# Patient Record
Sex: Female | Born: 1960 | Race: Black or African American | Hispanic: No | Marital: Single | Smoking: Never smoker
Health system: Southern US, Community
[De-identification: ages and names within clinical notes are randomized; demographics above are authoritative.]

## PROBLEM LIST (undated history)

## (undated) DIAGNOSIS — F0390 Unspecified dementia without behavioral disturbance: Secondary | ICD-10-CM

## (undated) DIAGNOSIS — D649 Anemia, unspecified: Secondary | ICD-10-CM

## (undated) DIAGNOSIS — E119 Type 2 diabetes mellitus without complications: Secondary | ICD-10-CM

## (undated) HISTORY — DX: Anemia, unspecified: D64.9

## (undated) HISTORY — PX: ABDOMINAL HYSTERECTOMY: SHX81

## (undated) HISTORY — PX: OTHER SURGICAL HISTORY: SHX169

## (undated) HISTORY — PX: CHOLECYSTECTOMY: SHX55

---

## 2018-04-06 ENCOUNTER — Emergency Department
Admission: EM | Admit: 2018-04-06 | Discharge: 2018-04-06 | Disposition: A | Discharge status: Home / Self Care | Payer: Self-pay | Attending: Emergency Medicine | Admitting: Emergency Medicine

## 2018-04-06 ENCOUNTER — Emergency Department: Payer: Self-pay

## 2018-04-06 ENCOUNTER — Other Ambulatory Visit: Payer: Self-pay

## 2018-04-06 ENCOUNTER — Encounter: Payer: Self-pay | Admitting: Emergency Medicine

## 2018-04-06 DIAGNOSIS — Y9389 Activity, other specified: Secondary | ICD-10-CM | POA: Insufficient documentation

## 2018-04-06 DIAGNOSIS — E119 Type 2 diabetes mellitus without complications: Secondary | ICD-10-CM | POA: Insufficient documentation

## 2018-04-06 DIAGNOSIS — Z794 Long term (current) use of insulin: Secondary | ICD-10-CM | POA: Insufficient documentation

## 2018-04-06 DIAGNOSIS — Y929 Unspecified place or not applicable: Secondary | ICD-10-CM | POA: Insufficient documentation

## 2018-04-06 DIAGNOSIS — W06XXXD Fall from bed, subsequent encounter: Secondary | ICD-10-CM | POA: Insufficient documentation

## 2018-04-06 DIAGNOSIS — S7002XA Contusion of left hip, initial encounter: Secondary | ICD-10-CM | POA: Insufficient documentation

## 2018-04-06 DIAGNOSIS — Y998 Other external cause status: Secondary | ICD-10-CM | POA: Insufficient documentation

## 2018-04-06 HISTORY — DX: Type 2 diabetes mellitus without complications: E11.9

## 2018-04-06 MED ORDER — MELOXICAM 7.5 MG PO TABS: 7.5000 mg | ORAL_TABLET | Freq: Every day | ORAL | 0 refills | Status: DC

## 2018-04-06 NOTE — Discharge Instructions (Signed)
Follow-up with your primary care doctor in [place].  Call make an appointment to be seen in 1 to 2 weeks.  Begin taking meloxicam 7.5 mg once a day with food every day until being seen by your doctor.

## 2018-04-06 NOTE — ED Triage Notes (Addendum)
Pt to triage via w/c with no distress noted; pt reports left hip pain after falling getting OOB, pt able to ambulate; denies any other c/o or injuries

## 2018-04-06 NOTE — ED Provider Notes (Signed)
[facility] Emergency Department Provider Note  ____________________________________________   First MD Initiated Contact with Patient 04/06/18 [phone]     (approximate)  I have reviewed the triage vital signs and the nursing notes.   HISTORY  Chief Complaint Hip Pain   HPI Bethany James is a 57 y.o. female their complaint of left hip pain.  Patient states that she fell out of bed approximately 1 month ago.  [facility] her [facility] and states that is not improved since that time.  She occasionally takes ibuprofen infrequently without any relief of her pain.  She continues to ambulate without assistance.  She denies any previous injury to her hip.  She denies any left leg edema since her injury.  Currently she rates her pain as 10/10.  Past Medical History:  Diagnosis Date  . Diabetes mellitus without complication (HCC)     There are no active problems to display for this patient.   Past Surgical History:  Procedure Laterality Date  . ABDOMINAL HYSTERECTOMY    . CHOLECYSTECTOMY      Prior to Admission medications   Medication Sig Start Date End Date Taking? Authorizing Provider  insulin aspart (NOVOLOG) 100 UNIT/ML injection Inject into the skin 3 (three) times daily before meals.   Yes [provider]  meloxicam (MOBIC) 7.5 MG tablet Take 1 tablet (7.5 mg total) by mouth daily. 04/06/18 04/06/19  Tommi RumpsSummers, Arelly Whittenberg L, PA-C    Allergies Patient has no known allergies.  No family history on file.  Social History Social History   Tobacco Use  . Smoking status: Never Smoker  . Smokeless tobacco: Never Used  Substance Use Topics  . Alcohol use: Not on file  . Drug use: Not on file    Review of Systems Constitutional: No fever/chills Cardiovascular: Denies chest pain. Respiratory: Denies shortness of breath. Gastrointestinal: No abdominal pain.  No nausea, no vomiting.  Musculoskeletal: Positive for left hip pain. Skin:  Negative for rash. Neurological: Negative for focal weakness or numbness. ___________________________________________   PHYSICAL EXAM:  VITAL SIGNS: ED Triage Vitals  Enc Vitals Group     BP 04/06/18 0638 114/84     Pulse Rate 04/06/18 0638 95     Resp 04/06/18 0638 18     Temp 04/06/18 0638 98.3 F (36.8 C)     Temp Source 04/06/18 0638 Oral     SpO2 04/06/18 0638 98 %     Weight 04/06/18 0636 157 lb (71.2 kg)     Height 04/06/18 0636 4\' 9"  (1.448 m)     Head Circumference --      Peak Flow --      Pain Score 04/06/18 0636 10     Pain Loc --      Pain Edu? --      Excl. in GC? --    Constitutional: Alert and oriented. Well appearing and in no acute distress. Eyes: Conjunctivae are normal.  Head: Atraumatic. Neck: No stridor.   Cardiovascular: Normal rate, regular rhythm. Grossly normal heart sounds.  Good peripheral circulation. Respiratory: Normal respiratory effort.  No retractions. Lungs CTAB. Gastrointestinal: Soft and nontender. No distention.  Musculoskeletal: On examination of left hip there is no gross deformity and range of motion is minimally restricted.  There is some tenderness on palpation posteriorly.  No soft tissue edema is appreciated.  Patient is able to abduct and adduct without difficulty.  Patient is ambulatory without assistance.  There is no edema noted  to the lower extremity.  Nontender to palpation left knee.  No effusion is appreciated.  Skin is intact. Neurologic:  Normal speech and language. No gross focal neurologic deficits are appreciated. No gait instability. Skin:  Skin is warm, dry and intact.  Psychiatric: Mood and affect are normal. Speech and behavior are normal.  ____________________________________________   LABS (all labs ordered are listed, but only abnormal results are displayed)  Labs Reviewed - No data to display  RADIOLOGY  ED MD interpretation:  Left hip x-ray is negative for fracture.  Official radiology report(s): Dg  Hip Unilat W Or Wo Pelvis 2-3 Views Left  Result Date: 04/06/2018 CLINICAL DATA:  Pt to triage via w/c with no distress noted; pt reports left hip pain after falling getting OOB a couple days ago and going to [facility] with no findings, pt able to ambulate; denies any other c/o or injuries EXAM: DG HIP (WITH OR WITHOUT PELVIS) 2-3V LEFT COMPARISON:  None. FINDINGS: There is no evidence of hip fracture or dislocation. There is no evidence of arthropathy or other focal bone abnormality. IMPRESSION: Negative. Electronically Signed   By: Norva Pavlov M.D.   On: 04/06/2018 07:15    ____________________________________________   PROCEDURES  Procedure(s) performed: None  Procedures  Critical Care performed: No  ____________________________________________   INITIAL IMPRESSION / ASSESSMENT AND PLAN / ED COURSE  As part of my medical decision making, I reviewed the following data within the electronic MEDICAL RECORD NUMBER Notes from prior ED visits and [place] Controlled Substance Database  Patient was made aware that x-rays are negative.  Apparently she was seen by her PCP at [facility]  in [place].  Patient states that she was examined but no x-rays were done.  She is reassured on today's x-rays that there is no hip fracture.  Patient was started on meloxicam 7.5 mg 1 daily with food.  She is encouraged to make an appointment with her PCP to follow-up in 1 to 2 weeks.  ____________________________________________   FINAL CLINICAL IMPRESSION(S) / ED DIAGNOSES  Final diagnoses:  Contusion of left hip, initial encounter     ED Discharge Orders        Ordered    meloxicam (MOBIC) 7.5 MG tablet  Daily     04/06/18 0732       Note:  This document was prepared using Dragon voice recognition software and may include unintentional dictation errors.    Tommi Rumps, PA-C 04/06/18 0957    Emily Filbert, MD 04/06/18 [phone]

## 2018-04-06 NOTE — ED Notes (Signed)
See triage note  States she fell about 1 month ago  Landed on left hip  States pain is getting worse and moving into leg  Denies new injury

## 2018-08-13 ENCOUNTER — Other Ambulatory Visit: Payer: Self-pay

## 2018-08-13 ENCOUNTER — Emergency Department
Admission: EM | Admit: 2018-08-13 | Discharge: 2018-08-13 | Disposition: A | Discharge status: Home / Self Care | Payer: Medicaid Other | Attending: Emergency Medicine | Admitting: Emergency Medicine

## 2018-08-13 DIAGNOSIS — H1033 Unspecified acute conjunctivitis, bilateral: Secondary | ICD-10-CM | POA: Diagnosis present

## 2018-08-13 DIAGNOSIS — Z794 Long term (current) use of insulin: Secondary | ICD-10-CM | POA: Diagnosis not present

## 2018-08-13 DIAGNOSIS — Z79899 Other long term (current) drug therapy: Secondary | ICD-10-CM | POA: Diagnosis not present

## 2018-08-13 DIAGNOSIS — H1032 Unspecified acute conjunctivitis, left eye: Secondary | ICD-10-CM

## 2018-08-13 DIAGNOSIS — E119 Type 2 diabetes mellitus without complications: Secondary | ICD-10-CM | POA: Insufficient documentation

## 2018-08-13 MED ORDER — ERYTHROMYCIN 5 MG/GM OP OINT
TOPICAL_OINTMENT | Freq: Once | OPHTHALMIC | Status: AC
  Administered 2018-08-13: 1 via OPHTHALMIC
  Filled 2018-08-13: qty 1

## 2018-08-13 MED ORDER — POLYMYXIN B-TRIMETHOPRIM 10000-0.1 UNIT/ML-% OP SOLN: 1.0000 [drp] | OPHTHALMIC | 0 refills | Status: AC

## 2018-08-13 MED ORDER — ERYTHROMYCIN 5 MG/GM OP OINT: 1.0000 "application " | TOPICAL_OINTMENT | Freq: Two times a day (BID) | OPHTHALMIC | 0 refills | Status: DC

## 2018-08-13 NOTE — ED Triage Notes (Signed)
Reports eye swelling and redness for months.

## 2018-08-13 NOTE — ED Provider Notes (Signed)
[facility] Emergency Department Provider Note  ____________________________________________  Time seen: Approximately 11:00 PM  I have reviewed the triage vital signs and the nursing notes.   HISTORY  Chief Complaint Conjunctivitis    HPI Bethany James is a 57 y.o. female presents to the emergency department with left eye conjunctivitis for approximately 1 week.  Patient has had increased tearing and matting and crusting in the eyelashes and eyelids.  Patient's granddaughter has similar symptoms.  No photophobia, pain with extraocular eye muscle movement or vision loss.    Past Medical History:  Diagnosis Date  . Diabetes mellitus without complication (HCC)     There are no active problems to display for this patient.   Past Surgical History:  Procedure Laterality Date  . ABDOMINAL HYSTERECTOMY    . CHOLECYSTECTOMY      Prior to Admission medications   Medication Sig Start Date End Date Taking? Authorizing Provider  insulin aspart (NOVOLOG) 100 UNIT/ML injection Inject into the skin 3 (three) times daily before meals.    [provider]  meloxicam (MOBIC) 7.5 MG tablet Take 1 tablet (7.5 mg total) by mouth daily. 04/06/18 04/06/19  Tommi Rumps, PA-C  trimethoprim-polymyxin b (POLYTRIM) ophthalmic solution Place 1 drop into the left eye every 4 (four) hours for 7 days. 08/13/18 08/20/18  Orvil Feil, PA-C    Allergies Patient has no known allergies.  No family history on file.  Social History Social History   Tobacco Use  . Smoking status: Never Smoker  . Smokeless tobacco: Never Used  Substance Use Topics  . Alcohol use: Not on file  . Drug use: Not on file     Review of Systems  Constitutional: No fever/chills Eyes: No visual changes. Patient has left eye conjunctivitis.  ENT: No upper respiratory complaints. Cardiovascular: no chest pain. Respiratory: no cough. No SOB. Gastrointestinal: No abdominal pain.  No  nausea, no vomiting.  No diarrhea.  No constipation. Musculoskeletal: Negative for musculoskeletal pain. Skin: Negative for rash, abrasions, lacerations, ecchymosis. Neurological: Negative for headaches, focal weakness or numbness.   ____________________________________________   PHYSICAL EXAM:  VITAL SIGNS: ED Triage Vitals [08/13/18 2228]  Enc Vitals Group     BP 132/81     Pulse Rate 67     Resp 18     Temp 97.6 F (36.4 C)     Temp Source Oral     SpO2 99 %     Weight 130 lb (59 kg)     Height 4\' 8"  (1.422 m)     Head Circumference      Peak Flow      Pain Score 0     Pain Loc      Pain Edu?      Excl. in GC?      Constitutional: Alert and oriented. Well appearing and in no acute distress. Eyes: Conjunctivae are normal. PERRL. EOMI. Head: Atraumatic. ENT:      Ears: Patient has left eye conjunctivitis.       Nose: No congestion/rhinnorhea.      Mouth/Throat: Mucous membranes are moist.  Neck: No stridor.  No cervical spine tenderness to palpation. Cardiovascular: Normal rate, regular rhythm. Normal S1 and S2.  Good peripheral circulation. Respiratory: Normal respiratory effort without tachypnea or retractions. Lungs CTAB. Good air entry to the bases with no decreased or absent breath sounds. Skin:  Skin is warm, dry and intact. No rash noted.   ____________________________________________   LABS (all labs ordered  are listed, but only abnormal results are displayed)  Labs Reviewed - No data to display ____________________________________________  EKG   ____________________________________________  RADIOLOGY   No results found.  ____________________________________________    PROCEDURES  Procedure(s) performed:    Procedures    Medications  erythromycin ophthalmic ointment (has no administration in time range)     ____________________________________________   INITIAL IMPRESSION / ASSESSMENT AND PLAN / ED COURSE  Pertinent labs &  imaging results that were available during my care of the patient were reviewed by me and considered in my medical decision making (see chart for details).  Review of the [place] CSRS was performed in accordance of the [facility] prior to dispensing any controlled drugs.    Assessment and plan Left eye conjunctivitis Patient presents to the emergency department with increased tearing and matting and crusting of the left eye for the past week.  Patient has a known contacts with bacterial conjunctivitis in the home.  Patient was treated with Polytrim. All patient questions were answered.     ____________________________________________  FINAL CLINICAL IMPRESSION(S) / ED DIAGNOSES  Final diagnoses:  Acute bacterial conjunctivitis of left eye      NEW MEDICATIONS STARTED DURING THIS VISIT:  ED Discharge Orders         Ordered    erythromycin ophthalmic ointment  2 times daily,   Status:  Discontinued     08/13/18 2248    trimethoprim-polymyxin b (POLYTRIM) ophthalmic solution  Every 4 hours     08/13/18 2250              This chart was dictated using voice recognition software/Dragon. Despite best efforts to proofread, errors can occur which can change the meaning. Any change was purely unintentional.    Orvil Feil, PA-C 08/13/18 2306    Jeanmarie Plant, MD 08/13/18 [phone]

## 2018-11-22 ENCOUNTER — Encounter: Payer: Self-pay | Admitting: Nurse Practitioner

## 2018-11-22 LAB — HM DIABETES EYE EXAM

## 2019-02-20 ENCOUNTER — Emergency Department: Payer: Medicaid Other

## 2019-02-20 ENCOUNTER — Inpatient Hospital Stay
Admission: EM | Admit: 2019-02-20 | Discharge: 2019-02-28 | DRG: 637 | Disposition: A | Discharge status: Home / Self Care | Payer: Medicaid Other | Attending: Internal Medicine | Admitting: Internal Medicine

## 2019-02-20 ENCOUNTER — Other Ambulatory Visit: Payer: Self-pay

## 2019-02-20 ENCOUNTER — Ambulatory Visit: Payer: Self-pay

## 2019-02-20 DIAGNOSIS — D62 Acute posthemorrhagic anemia: Secondary | ICD-10-CM | POA: Diagnosis not present

## 2019-02-20 DIAGNOSIS — E876 Hypokalemia: Secondary | ICD-10-CM | POA: Diagnosis not present

## 2019-02-20 DIAGNOSIS — I469 Cardiac arrest, cause unspecified: Secondary | ICD-10-CM | POA: Diagnosis not present

## 2019-02-20 DIAGNOSIS — E872 Acidosis: Secondary | ICD-10-CM | POA: Diagnosis not present

## 2019-02-20 DIAGNOSIS — K254 Chronic or unspecified gastric ulcer with hemorrhage: Secondary | ICD-10-CM | POA: Diagnosis present

## 2019-02-20 DIAGNOSIS — E0810 Diabetes mellitus due to underlying condition with ketoacidosis without coma: Secondary | ICD-10-CM | POA: Diagnosis not present

## 2019-02-20 DIAGNOSIS — K269 Duodenal ulcer, unspecified as acute or chronic, without hemorrhage or perforation: Secondary | ICD-10-CM | POA: Diagnosis present

## 2019-02-20 DIAGNOSIS — T17918A Gastric contents in respiratory tract, part unspecified causing other injury, initial encounter: Secondary | ICD-10-CM | POA: Diagnosis not present

## 2019-02-20 DIAGNOSIS — Z90710 Acquired absence of both cervix and uterus: Secondary | ICD-10-CM | POA: Diagnosis not present

## 2019-02-20 DIAGNOSIS — N179 Acute kidney failure, unspecified: Secondary | ICD-10-CM | POA: Diagnosis present

## 2019-02-20 DIAGNOSIS — E1110 Type 2 diabetes mellitus with ketoacidosis without coma: Secondary | ICD-10-CM | POA: Diagnosis present

## 2019-02-20 DIAGNOSIS — K449 Diaphragmatic hernia without obstruction or gangrene: Secondary | ICD-10-CM | POA: Diagnosis present

## 2019-02-20 DIAGNOSIS — J690 Pneumonitis due to inhalation of food and vomit: Secondary | ICD-10-CM | POA: Diagnosis not present

## 2019-02-20 DIAGNOSIS — Z978 Presence of other specified devices: Secondary | ICD-10-CM

## 2019-02-20 DIAGNOSIS — R578 Other shock: Secondary | ICD-10-CM | POA: Diagnosis present

## 2019-02-20 DIAGNOSIS — E870 Hyperosmolality and hypernatremia: Secondary | ICD-10-CM | POA: Diagnosis present

## 2019-02-20 DIAGNOSIS — Z4659 Encounter for fitting and adjustment of other gastrointestinal appliance and device: Secondary | ICD-10-CM

## 2019-02-20 DIAGNOSIS — G9340 Encephalopathy, unspecified: Secondary | ICD-10-CM | POA: Diagnosis not present

## 2019-02-20 DIAGNOSIS — E875 Hyperkalemia: Secondary | ICD-10-CM | POA: Diagnosis present

## 2019-02-20 DIAGNOSIS — J9601 Acute respiratory failure with hypoxia: Secondary | ICD-10-CM

## 2019-02-20 DIAGNOSIS — J9602 Acute respiratory failure with hypercapnia: Secondary | ICD-10-CM | POA: Diagnosis not present

## 2019-02-20 DIAGNOSIS — E1165 Type 2 diabetes mellitus with hyperglycemia: Secondary | ICD-10-CM | POA: Diagnosis present

## 2019-02-20 DIAGNOSIS — Z794 Long term (current) use of insulin: Secondary | ICD-10-CM | POA: Diagnosis not present

## 2019-02-20 DIAGNOSIS — R571 Hypovolemic shock: Secondary | ICD-10-CM | POA: Diagnosis not present

## 2019-02-20 DIAGNOSIS — Z9049 Acquired absence of other specified parts of digestive tract: Secondary | ICD-10-CM

## 2019-02-20 DIAGNOSIS — J9600 Acute respiratory failure, unspecified whether with hypoxia or hypercapnia: Secondary | ICD-10-CM

## 2019-02-20 DIAGNOSIS — K920 Hematemesis: Secondary | ICD-10-CM | POA: Diagnosis present

## 2019-02-20 DIAGNOSIS — D72829 Elevated white blood cell count, unspecified: Secondary | ICD-10-CM

## 2019-02-20 LAB — COMPREHENSIVE METABOLIC PANEL
ALT: 11 U/L (ref 0–44)
AST: 18 U/L (ref 15–41)
Albumin: 4.5 g/dL (ref 3.5–5.0)
Alkaline Phosphatase: 113 U/L (ref 38–126)
Anion gap: 28 — ABNORMAL HIGH (ref 5–15)
BUN: 34 mg/dL — ABNORMAL HIGH (ref 6–20)
CO2: 9 mmol/L — ABNORMAL LOW (ref 22–32)
Calcium: 9.2 mg/dL (ref 8.9–10.3)
Chloride: 98 mmol/L (ref 98–111)
Creatinine, Ser: 2.76 mg/dL — ABNORMAL HIGH (ref 0.44–1.00)
GFR calc Af Amer: 21 mL/min — ABNORMAL LOW (ref >60)
GFR calc non Af Amer: 18 mL/min — ABNORMAL LOW (ref >60)
Glucose, Bld: 866 mg/dL (ref 70–99)
Potassium: 6.5 mmol/L (ref 3.5–5.1)
Sodium: 135 mmol/L (ref 135–145)
Total Bilirubin: 1.1 mg/dL (ref 0.3–1.2)
Total Protein: 7.9 g/dL (ref 6.5–8.1)

## 2019-02-20 LAB — BASIC METABOLIC PANEL
BUN: 34 mg/dL — ABNORMAL HIGH (ref 6–20)
CO2: <7 mmol/L — ABNORMAL LOW (ref 22–32)
Calcium: 8.1 mg/dL — ABNORMAL LOW (ref 8.9–10.3)
Chloride: 111 mmol/L (ref 98–111)
Creatinine, Ser: 2.15 mg/dL — ABNORMAL HIGH (ref 0.44–1.00)
GFR calc Af Amer: 29 mL/min — ABNORMAL LOW (ref >60)
GFR calc non Af Amer: 25 mL/min — ABNORMAL LOW (ref >60)
Glucose, Bld: 705 mg/dL (ref 70–99)
Potassium: 6.4 mmol/L (ref 3.5–5.1)
Sodium: 141 mmol/L (ref 135–145)

## 2019-02-20 LAB — BLOOD GAS, VENOUS
Acid-base deficit: 24.7 mmol/L — ABNORMAL HIGH (ref 0.0–2.0)
Bicarbonate: 7.6 mmol/L — ABNORMAL LOW (ref 20.0–28.0)
O2 Saturation: 26.8 %
Patient temperature: 37
pCO2, Ven: 38 mmHg — ABNORMAL LOW (ref 44.0–60.0)
pH, Ven: 6.91 — CL (ref 7.250–7.430)
pO2, Ven: 33 mmHg (ref 32.0–45.0)

## 2019-02-20 LAB — CBC
HCT: 44.5 % (ref 36.0–46.0)
HCT: 36.8 % (ref 36.0–46.0)
Hemoglobin: 14 g/dL (ref 12.0–15.0)
Hemoglobin: 11.2 g/dL — ABNORMAL LOW (ref 12.0–15.0)
MCH: 29.7 pg (ref 26.0–34.0)
MCH: 29.6 pg (ref 26.0–34.0)
MCHC: 31.5 g/dL (ref 30.0–36.0)
MCHC: 30.4 g/dL (ref 30.0–36.0)
MCV: 94.5 fL (ref 80.0–100.0)
MCV: 97.4 fL (ref 80.0–100.0)
Platelets: 445 10*3/uL — ABNORMAL HIGH (ref 150–400)
Platelets: 377 10*3/uL (ref 150–400)
RBC: 4.71 MIL/uL (ref 3.87–5.11)
RBC: 3.78 MIL/uL — ABNORMAL LOW (ref 3.87–5.11)
RDW: 13.2 % (ref 11.5–15.5)
RDW: 13.2 % (ref 11.5–15.5)
WBC: 27.7 10*3/uL — ABNORMAL HIGH (ref 4.0–10.5)
WBC: 27.3 10*3/uL — ABNORMAL HIGH (ref 4.0–10.5)
nRBC: 0 % (ref 0.0–0.2)
nRBC: 0 % (ref 0.0–0.2)

## 2019-02-20 LAB — GLUCOSE, CAPILLARY
Glucose-Capillary: >600 mg/dL (ref 70–99)
Glucose-Capillary: >600 mg/dL (ref 70–99)
Glucose-Capillary: >600 mg/dL (ref 70–99)
Glucose-Capillary: >600 mg/dL (ref 70–99)

## 2019-02-20 LAB — MRSA PCR SCREENING: MRSA by PCR: NEGATIVE

## 2019-02-20 MED ORDER — INSULIN REGULAR(HUMAN) IN NACL 100-0.9 UT/100ML-% IV SOLN
INTRAVENOUS | Status: DC
  Administered 2019-02-20: 5.4 [IU]/h via INTRAVENOUS
  Filled 2019-02-20: qty 100

## 2019-02-20 MED ORDER — ONDANSETRON HCL 4 MG/2ML IJ SOLN
4.0000 mg | Freq: Once | INTRAMUSCULAR | Status: AC
  Administered 2019-02-20: 4 mg via INTRAVENOUS
  Filled 2019-02-20: qty 2

## 2019-02-20 MED ORDER — HEPARIN SODIUM (PORCINE) 5000 UNIT/ML IJ SOLN
5000.0000 [IU] | Freq: Three times a day (TID) | INTRAMUSCULAR | Status: DC
  Administered 2019-02-20: 5000 [IU] via SUBCUTANEOUS
  Filled 2019-02-20: qty 1

## 2019-02-20 MED ORDER — PANTOPRAZOLE SODIUM 40 MG IV SOLR
40.0000 mg | Freq: Two times a day (BID) | INTRAVENOUS | Status: DC
  Administered 2019-02-20: 23:00:00 40 mg via INTRAVENOUS
  Filled 2019-02-20: qty 40

## 2019-02-20 MED ORDER — SODIUM CHLORIDE 0.9 % IV SOLN
INTRAVENOUS | Status: DC
  Administered 2019-02-20: 22:00:00 via INTRAVENOUS

## 2019-02-20 MED ORDER — SODIUM CHLORIDE 0.9 % IV BOLUS
1000.0000 mL | Freq: Once | INTRAVENOUS | Status: AC
  Administered 2019-02-20: 1000 mL via INTRAVENOUS

## 2019-02-20 MED ORDER — DEXTROSE-NACL 5-0.45 % IV SOLN
INTRAVENOUS | Status: DC
  Administered 2019-02-21: 08:00:00 via INTRAVENOUS

## 2019-02-20 MED ORDER — SODIUM CHLORIDE 0.9 % IV BOLUS
500.0000 mL | Freq: Once | INTRAVENOUS | Status: AC
  Administered 2019-02-20: 500 mL via INTRAVENOUS

## 2019-02-20 MED ORDER — SODIUM CHLORIDE 0.9 % IV BOLUS
1000.0000 mL | Freq: Once | INTRAVENOUS | Status: AC
Start: 2019-02-20 — End: 2019-02-20
  Administered 2019-02-20: 1000 mL via INTRAVENOUS

## 2019-02-20 MED ORDER — INSULIN REGULAR(HUMAN) IN NACL 100-0.9 UT/100ML-% IV SOLN
INTRAVENOUS | Status: DC
  Administered 2019-02-21: 18.1 [IU]/h via INTRAVENOUS
  Administered 2019-02-21: 13.8 [IU]/h via INTRAVENOUS
  Filled 2019-02-20: qty 100

## 2019-02-20 MED ORDER — SODIUM CHLORIDE 0.9 % IV SOLN
INTRAVENOUS | Status: DC | PRN
  Administered 2019-02-21 – 2019-02-22 (×2): 250 mL via INTRAVENOUS

## 2019-02-20 MED ORDER — SODIUM CHLORIDE 0.9 % IV SOLN
INTRAVENOUS | Status: DC
  Administered 2019-02-20: 21:00:00 via INTRAVENOUS

## 2019-02-20 NOTE — ED Triage Notes (Signed)
Pt arrived via [facility] from home after throwing up blood all day. Pt CBG read high with ems, pt reports not taking insulin today. Pt has hx of CKD, no dialysis. Pt moaning on arrival.

## 2019-02-20 NOTE — ED Provider Notes (Signed)
[facility] Emergency Department Provider Note  Time seen: 7:20 PM  I have reviewed the triage vital signs and the nursing notes.   HISTORY  Chief Complaint Hematemesis    HPI Bethany James is a 58 y.o. female with a past medical history of diabetes,  presents to the emergency department for elevated blood glucose as well as vomiting now with blood in her vomit.  According to the patient she took insulin this morning but has not taken any since.  States her blood sugars been over 600 the entire day.  Patient states she has been vomiting and has now noticed blood in her vomit as well.  Denies any abdominal pain.  States mild nausea, denies any history of DKA.  Patient denies any fever cough or congestion.  No recent travel or known sick contacts. Denies any abdominal pain or diarrhea.  Past Medical History:  Diagnosis Date  . Diabetes mellitus without complication (HCC)     There are no active problems to display for this patient.   Past Surgical History:  Procedure Laterality Date  . ABDOMINAL HYSTERECTOMY    . CHOLECYSTECTOMY      Prior to Admission medications   Medication Sig Start Date End Date Taking? Authorizing Provider  insulin aspart (NOVOLOG) 100 UNIT/ML injection Inject into the skin 3 (three) times daily before meals.    [provider]  meloxicam (MOBIC) 7.5 MG tablet Take 1 tablet (7.5 mg total) by mouth daily. 04/06/18 04/06/19  Bridget Hartshorn L, PA-C    No Known Allergies  History reviewed. No pertinent family history.  Social History Social History   Tobacco Use  . Smoking status: Never Smoker  . Smokeless tobacco: Never Used  Substance Use Topics  . Alcohol use: Not Currently  . Drug use: Never    Review of Systems Constitutional: Negative for fever Cardiovascular: Negative for chest pain. Respiratory: Negative for shortness of breath. Gastrointestinal: Negative for abdominal pain.  Positive for nausea vomiting no  blood in the vomit.  Negative for diarrhea Musculoskeletal: Negative for musculoskeletal complaints Skin: Negative for skin complaints  Neurological: Negative for headache All other ROS negative  ____________________________________________   PHYSICAL EXAM:  VITAL SIGNS: ED Triage Vitals  Enc Vitals Group     BP 02/20/19 1913 100/76     Pulse Rate 02/20/19 1913 (!) 103     Resp 02/20/19 1913 (!) 26     Temp 02/20/19 1913 98 F (36.7 C)     Temp Source 02/20/19 1913 Axillary     SpO2 02/20/19 1913 100 %     Weight 02/20/19 1910 150 lb (68 kg)     Height 02/20/19 1910 4\' 9"  (1.448 m)     Head Circumference --      Peak Flow --      Pain Score 02/20/19 1910 0     Pain Loc --      Pain Edu? --      Excl. in GC? --     Constitutional: Alert and oriented. Well appearing and in no distress. Eyes: Normal exam ENT      Head: Normocephalic and atraumatic.      Mouth/Throat: Mildly dry mucous membranes. Cardiovascular: Normal rate, regular rhythm.  Respiratory: Normal respiratory effort without tachypnea nor retractions. Breath sounds are clear Gastrointestinal: Soft and nontender. No distention.  Musculoskeletal: Nontender with normal range of motion in all extremities.  Neurologic:  Normal speech and language. No gross focal neurologic deficits  Skin:  Skin is warm, dry and intact.  Psychiatric: Mood and affect are normal.   ____________________________________________    EKG  EKG viewed and interpreted by myself shows sinus tachycardia 100 bpm with a narrow QRS, normal axis, normal intervals, no concerning ST changes.  ____________________________________________    RADIOLOGY  X-rays negative  ____________________________________________   INITIAL IMPRESSION / ASSESSMENT AND PLAN / ED COURSE  Pertinent labs & imaging results that were available during my care of the patient were reviewed by me and considered in my medical decision making (see chart for  details).   Patient presents to the emergency department for elevated blood glucose greater than 600 all day today.  Patient states she took insulin this morning.  Has been nauseated with vomiting today now with blood in her vomit.  Differential would include DKA, gastritis, Mallory-Weiss syndrome, upper GI bleed, gastroparesis.  We will check labs including a VBG, start with IV hydration, treat nausea and continue to closely monitor.  Patient agreeable to plan of care.  Overall the patient appears well, reassuringly benign abdominal exam.  Patient's labs are resulted showing significant leukocytosis of 27,000, blood glucose is significantly elevated greater than 800, potassium greater than 6.  VBG is resulted with a pH of 6.9 consistent with severe diabetic ketoacidosis.  We will start on insulin infusion, continue with IV hydration and admit to the hospital service.  Patient agreeable to plan of care.  Bethany James was evaluated in Emergency Department on 02/20/2019 for the symptoms described in the history of present illness. She was evaluated in the context of the global COVID-19 pandemic, which necessitated consideration that the patient might be at risk for infection with the SARS-CoV-2 virus that causes COVID-19. Institutional protocols and algorithms that pertain to the evaluation of patients at risk for COVID-19 are in a state of rapid change based on information released by regulatory bodies including the CDC and federal and state organizations. These policies and algorithms were followed during the patient's care in the ED.  CRITICAL CARE Performed by: Minna AntisKevin Anida Deol   Total critical care time: 30 minutes  Critical care time was exclusive of separately billable procedures and treating other patients.  Critical care was necessary to treat or prevent imminent or life-threatening deterioration.  Critical care was time spent personally by me on the following activities: development of  treatment plan with patient and/or surrogate as well as nursing, discussions with consultants, evaluation of patient's response to treatment, examination of patient, obtaining history from patient or surrogate, ordering and performing treatments and interventions, ordering and review of laboratory studies, ordering and review of radiographic studies, pulse oximetry and re-evaluation of patient's condition.   ____________________________________________   FINAL CLINICAL IMPRESSION(S) / ED DIAGNOSES  Hyperglycemia   Minna AntisPaduchowski, Azia Toutant, MD 02/20/19 2029

## 2019-02-20 NOTE — ED Notes (Signed)
Patient transported to X-ray 

## 2019-02-20 NOTE — Telephone Encounter (Signed)
Patient's daughter called and says her mother around 0900 this morning for breakfast ate oatmeal and she ate peaches from a can that was opened in the refrigerator. She says around 1200, her mother was c/o abdominal pain and she vomited up the peaches and oatmeal that she ate for breakfast. She says her husband noticed the can had rust on the inside of it. She says the patient had a normal BM about 30 minutes ago and is now laying down. The patient says her abdomen is no longer hurting, no other complaints except for feeling a little weak. The daughter says the patient only vomited that one time and said after she vomited her stomach wasn't hurting anymore. She says the patient has since drank water and ate some crackers that stayed down. I advised the daughter home care advice, she verbalized understanding.   Reason for Disposition . MILD or MODERATE vomiting (e.g., 1 - 5 times / day)  Answer Assessment - Initial Assessment Questions 1. VOMITING SEVERITY: "How many times have you vomited in the past 24 hours?"     - MILD:  1 - 2 times/day    - MODERATE: 3 - 5 times/day, decreased oral intake without significant weight loss or symptoms of dehydration    - SEVERE: 6 or more times/day, vomits everything or nearly everything, with significant weight loss, symptoms of dehydration      Mild, vomited once 2. ONSET: "When did the vomiting begin?"      Vomited around 12 pm today 3. FLUIDS: "What fluids or food have you vomited up today?" "Have you been able to keep any fluids down?"    Vomited up peaches; she was given round butter crackers and has not vomited since then 4. ABDOMINAL PAIN: "Are your having any abdominal pain?" If yes : "How bad is it and what does it feel like?" (e.g., crampy, dull, intermittent, constant)      Was having abdominal pain, then vomited and the pain went away 5. DIARRHEA: "Is there any diarrhea?" If so, ask: "How many times today?"      No 6. CONTACTS: "Is there anyone else  in the family with the same symptoms?"      No 7. CAUSE: "What do you think is causing your vomiting?"     The peaches in a rusted can 8. HYDRATION STATUS: "Any signs of dehydration?" (e.g., dry mouth [not only dry lips], too weak to stand) "When did you last urinate?"     No 9. OTHER SYMPTOMS: "Do you have any other symptoms?" (e.g., fever, headache, vertigo, vomiting blood or coffee grounds, recent head injury)     Just weakness after vomiting 10. PREGNANCY: "Is there any chance you are pregnant?" "When was your last menstrual period?"       No  Protocols used: [facility]

## 2019-02-20 NOTE — Progress Notes (Signed)
eLink Physician-Brief Progress Note Patient Name: Bethany James DOB: 04-27-1961 MRN: [id number]   Date of Service  02/20/2019  HPI/Events of Note  58 y/o F DM presented with elevated glucose 800s and vomiting, reportedly now bloody. She was in DKA and has been started on insulin and fluids. No recurrence of bloody vomitus since admit.  eICU Interventions   Continue insulin drip as per protocol until patient is able to tolerate PO  PPI and monitor for bleeding recurrence     Intervention Category Evaluation Type: New Patient Evaluation  Darl Pikes 02/20/2019, 11:22 PM

## 2019-02-20 NOTE — H&P (Addendum)
[facility] - [place] at [facility]   PATIENT NAME: Bethany James    MR#:  [id number]  DATE OF BIRTH:  02-05-1961  DATE OF ADMISSION:  02/20/2019  PRIMARY CARE PHYSICIAN: System, Pcp Not In   REQUESTING/REFERRING PHYSICIAN: Paduchowski, MD  CHIEF COMPLAINT:   Chief Complaint  Patient presents with  . Hematemesis    HISTORY OF PRESENT ILLNESS:  Bethany James  is a 58 y.o. female who presents with chief complaint as above.  Patient presents to the ED with a complaint of nausea and vomiting all day.  She states that she ate her oatmeal and peaches this morning, and then began throwing up shortly thereafter and has not been able to stop all day.  She states that later episodes of emesis were blood tinged.  She has a history of diabetes, but reports never having been in DKA before.  Work-up here in the ED shows significant DKA today.  She states that her glucose was in the 150s this morning when she ate her breakfast, and she thinks she took her insulin but she cannot recall for sure.  Hospitalist were called for admission  PAST MEDICAL HISTORY:   Past Medical History:  Diagnosis Date  . Diabetes mellitus without complication (HCC)      PAST SURGICAL HISTORY:   Past Surgical History:  Procedure Laterality Date  . ABDOMINAL HYSTERECTOMY    . CHOLECYSTECTOMY       SOCIAL HISTORY:   Social History   Tobacco Use  . Smoking status: Never Smoker  . Smokeless tobacco: Never Used  Substance Use Topics  . Alcohol use: Not Currently     FAMILY HISTORY:    Family history reviewed and is non-contributory DRUG ALLERGIES:  No Known Allergies  MEDICATIONS AT HOME:   Prior to Admission medications   Medication Sig Start Date End Date Taking? Authorizing Provider  insulin aspart (NOVOLOG) 100 UNIT/ML injection Inject 5 Units into the skin 3 (three) times daily before meals.    Yes [provider]  insulin detemir (LEVEMIR) 100 UNIT/ML injection  Inject 15 Units into the skin at bedtime.   Yes [provider]    REVIEW OF SYSTEMS:  Review of Systems  Constitutional: Positive for malaise/fatigue. Negative for chills, fever and weight loss.  HENT: Negative for ear pain, hearing loss and tinnitus.   Eyes: Negative for blurred vision, double vision, pain and redness.  Respiratory: Negative for cough, hemoptysis and shortness of breath.   Cardiovascular: Negative for chest pain, palpitations, orthopnea and leg swelling.  Gastrointestinal: Positive for nausea and vomiting. Negative for abdominal pain, constipation and diarrhea.  Genitourinary: Negative for dysuria, frequency and hematuria.  Musculoskeletal: Negative for back pain, joint pain and neck pain.  Skin:       No acne, rash, or lesions  Neurological: Negative for dizziness, tremors, focal weakness and weakness.  Endo/Heme/Allergies: Negative for polydipsia. Does not bruise/bleed easily.  Psychiatric/Behavioral: Negative for depression. The patient is not nervous/anxious and does not have insomnia.      VITAL SIGNS:   Vitals:   02/20/19 1910 02/20/19 1913  BP:  100/76  Pulse:  (!) 103  Resp:  (!) 26  Temp:  98 F (36.7 C)  TempSrc:  Axillary  SpO2:  100%  Weight: 68 kg   Height:  (1.448 m)    Wt Readings from Last 3 Encounters:  02/20/19 68 kg  08/13/18 59 kg  04/06/18 71.2 kg    PHYSICAL EXAMINATION:  Physical Exam  Vitals reviewed. Constitutional: She is oriented to person, place, and time. She appears well-developed and well-nourished. No distress.  HENT:  Head: Normocephalic and atraumatic.  Dry mucous membranes  Eyes: Pupils are equal, round, and reactive to light. Conjunctivae and EOM are normal. No scleral icterus.  Neck: Normal range of motion. Neck supple. No JVD present. No thyromegaly present.  Cardiovascular: Regular rhythm and intact distal pulses. Exam reveals no gallop and no friction rub.  No murmur heard. tachycardic   Respiratory: Effort normal and breath sounds normal. No respiratory distress. She has no wheezes. She has no rales.  GI: Soft. Bowel sounds are normal. She exhibits no distension. There is no abdominal tenderness.  Musculoskeletal: Normal range of motion.        General: No edema.     Comments: No arthritis, no gout  Lymphadenopathy:    She has no cervical adenopathy.  Neurological: She is alert and oriented to person, place, and time. No cranial nerve deficit.  No dysarthria, no aphasia  Skin: Skin is warm and dry. No rash noted. No erythema.  Psychiatric: She has a normal mood and affect. Her behavior is normal. Judgment and thought content normal.    LABORATORY PANEL:   CBC Recent Labs  Lab 02/20/19 1913  WBC 27.7*  HGB 14.0  HCT 44.5  PLT 445*   ------------------------------------------------------------------------------------------------------------------  Chemistries  Recent Labs  Lab 02/20/19 1913  NA 135  K 6.5*  CL 98  CO2 9*  GLUCOSE 866*  BUN 34*  CREATININE 2.76*  CALCIUM 9.2  AST 18  ALT 11  ALKPHOS 113  BILITOT 1.1   ------------------------------------------------------------------------------------------------------------------  Cardiac Enzymes No results for input(s): TROPONINI in the last 168 hours. ------------------------------------------------------------------------------------------------------------------  RADIOLOGY:  Dg Abdomen Acute W/chest  Result Date: 02/20/2019 CLINICAL DATA:  Abdominal pain EXAM: DG ABDOMEN ACUTE W/ 1V CHEST COMPARISON:  None. FINDINGS: Moderate stool burden in the colon. The bowel gas pattern is normal. There is no evidence of free intraperitoneal air. No suspicious radio-opaque calculi or other significant radiographic abnormality is seen. Heart size and mediastinal contours are within normal limits. Both lungs are clear. IMPRESSION: Moderate stool burden.  No acute findings. Electronically Signed   By: Charlett Nose M.D.   On: 02/20/2019 20:05    EKG:  No orders found for this or any previous visit.  IMPRESSION AND PLAN:  Principal Problem:   DKA (diabetic ketoacidoses) (HCC) -venous blood gas showed pH of 6.9.  Glucose is in the 800s with an anion gap of 28.  We will admit her to ICU/stepdown with insulin drip and aggressive fluid hydration.  Her K was elevated, I expect this to come down with insulin administration.  Intensivist consult.  We will check a hemoglobin A1c, as the patient is unable to give me a clear idea of her glucose trends. Active Problems:   AKI (acute kidney injury) (HCC) -aggressive IV fluids as above, avoid nephrotoxins and monitor  Chart review performed and case discussed with ED provider. Labs, imaging and/or ECG reviewed by provider and discussed with patient/family. Management plans discussed with the patient and/or family.  DVT PROPHYLAXIS: SubQ heparin  GI PROPHYLAXIS:  None  ADMISSION STATUS: Inpatient     CODE STATUS: Full Advance Directive Documentation     Most Recent Value  Type of Advance Directive  Healthcare Power of Attorney [Daughter- Tasha]  Pre-existing out of facility DNR order (yellow form or pink MOST form)  -  "MOST" Form in  Place?  -      TOTAL CRITICAL CARE TIME TAKING CARE OF THIS PATIENT: 50 minutes.   Barney DrainDavid F Tkeyah Burkman 02/20/2019, 8:40 PM  [facility]  Office  [phone]  CC: Primary care physician; System, Pcp Not In  Note:  This document was prepared using Dragon voice recognition software and may include unintentional dictation errors.

## 2019-02-20 NOTE — Plan of Care (Signed)
Patient remains on insulin drip. CBG remains >600. CBG every hour. BMP every 6 hours.

## 2019-02-20 NOTE — Consult Note (Signed)
Name: Bethany James MRN: [id number] DOB: 1961/06/13    ADMISSION DATE:  02/20/2019 CONSULTATION DATE: 02/20/2019  REFERRING MD : Dr. Anne Hahn  CHIEF COMPLAINT: Hematemesis   BRIEF PATIENT DESCRIPTION:  58 yo female admitted with hematemesis and DKA requiring insulin gtt   SIGNIFICANT EVENTS/STUDIES:  04/21-Pt admitted to the stepdown unit   HISTORY OF PRESENT ILLNESS:   This is a 58 yo female with a PMH of Type II Diabetes Mellitus.  She presented to [facility] ER on 04/21 with elevated blood glucose and vomiting bright red blood.  Per ER notes the pt stated she took insulin the morning of 04/21, however she did not take insulin the rest of the day.  Her CBG readings were in the 600's, and the pt endorsed vomiting blood.  Lab results revealed K+ 6.5, CO2 9, glucose 866, BUN 34, creatinine 2.76, anion gap 28, wbc 27.7, hgb 14, and vbg 6.91/pCO2 38.  Pt ruled in for DKA, therefore she was placed on insulin gtt. She has not vomited blood since the morning of 04/21, and denies taking NSAID's, anticoagulants, tylenol, or herbal remedies. She was subsequently admitted to the stepdown unit by hospitalist team for additional workup and treatment.   PAST MEDICAL HISTORY :   has a past medical history of Diabetes mellitus without complication (HCC).  has a past surgical history that includes Cholecystectomy and Abdominal hysterectomy. Prior to Admission medications   Medication Sig Start Date End Date Taking? Authorizing Provider  insulin aspart (NOVOLOG) 100 UNIT/ML injection Inject 5 Units into the skin 3 (three) times daily before meals.    Yes [provider]  insulin detemir (LEVEMIR) 100 UNIT/ML injection Inject 15 Units into the skin at bedtime.   Yes [provider]   No Known Allergies  FAMILY HISTORY:  family history is not on file. SOCIAL HISTORY:  reports that she has never smoked. She has never used smokeless tobacco. She reports previous alcohol use. She reports that  she does not use drugs.  REVIEW OF SYSTEMS: Positives in BOLD    Constitutional: Negative for fever, chills, weight loss, malaise/fatigue and diaphoresis.  HENT: Negative for hearing loss, ear pain, nosebleeds, congestion, sore throat, neck pain, tinnitus and ear discharge.   Eyes: Negative for blurred vision, double vision, photophobia, pain, discharge and redness.  Respiratory: Negative for cough, hemoptysis, sputum production, shortness of breath, wheezing and stridor.   Cardiovascular: Negative for chest pain, palpitations, orthopnea, claudication, leg swelling and PND.  Gastrointestinal: heartburn, hematemesis, vomiting, abdominal pain, diarrhea, constipation, blood in stool and melena.  Genitourinary: Negative for dysuria, urgency, frequency, hematuria and flank pain.  Musculoskeletal: Negative for myalgias, back pain, joint pain and falls.  Skin: Negative for itching and rash.  Neurological: Negative for dizziness, tingling, tremors, sensory change, speech change, focal weakness, seizures, loss of consciousness, weakness and headaches.  Endo/Heme/Allergies: hyperglycemia, environmental allergies and polydipsia. Does not bruise/bleed easily.  SUBJECTIVE:  No complaints at this time   VITAL SIGNS: Temp:  [98 F (36.7 C)] 98 F (36.7 C) (04/21 1913) Pulse Rate:  [103] 103 (04/21 1913) Resp:  [26] 26 (04/21 1913) BP: (100)/(76) 100/76 (04/21 1913) SpO2:  [100 %] 100 % (04/21 1913) Weight:  [75 kg] 68 kg (04/21 1910)  PHYSICAL EXAMINATION: General: well developed, well nourished female NAD  Neuro: alert and oriented, follows commands  HEENT: supple, no JVD  Cardiovascular: sinus tach, no R/G  Lungs: clear throughout, even, non labored  Abdomen: +BS x4, soft, non tender, non distended  Musculoskeletal: normal bulk and tone, no edema Skin: intact no rashes or lesions present   Recent Labs  Lab 02/20/19 1913  NA 135  K 6.5*  CL 98  CO2 9*  BUN 34*  CREATININE 2.76*   GLUCOSE 866*   Recent Labs  Lab 02/20/19 1913  HGB 14.0  HCT 44.5  WBC 27.7*  PLT 445*   Dg Abdomen Acute W/chest  Result Date: 02/20/2019 CLINICAL DATA:  Abdominal pain EXAM: DG ABDOMEN ACUTE W/ 1V CHEST COMPARISON:  None. FINDINGS: Moderate stool burden in the colon. The bowel gas pattern is normal. There is no evidence of free intraperitoneal air. No suspicious radio-opaque calculi or other significant radiographic abnormality is seen. Heart size and mediastinal contours are within normal limits. Both lungs are clear. IMPRESSION: Moderate stool burden.  No acute findings. Electronically Signed   By: Charlett NoseKevin  Dover M.D.   On: 02/20/2019 20:05    ASSESSMENT / PLAN:  Diabetic Ketoacidosis  Continue insulin gtt until anion gap closed and serum glucose >20 CBG's q1hr and BMP q4hrs while on insulin gtt  Diabetes coordinator consulted appreciate input   Acute renal failure  Hyperkalemia in setting of DKA  Trend BMP  Replace electrolytes as indicated  Monitor UOP  IV fluids per DKA protocol Avoid nephrotoxic medications  Continuous telemetry monitoring   Leukocytosis  Trend WBC and monitor fever curve Will check PCT and lactic acid  UA and urine culture pending  Hematemesis/Nausea  Trend CBC Prn zofran for nausea and vomiting  Keep NPO for now  SUP px: po protonix  VTE px: SCD's  Will consult GI if pt has another episode of hematemesis and/or decreased hgb  Sonda Rumbleana Blakeney, AGNP  Pulmonary/Critical Care Pager [phone] (please enter 7 digits) PCCM Consult Pager [phone] (please enter 7 digits)

## 2019-02-21 ENCOUNTER — Inpatient Hospital Stay: Payer: Medicaid Other

## 2019-02-21 ENCOUNTER — Encounter: Payer: Self-pay | Admitting: Internal Medicine

## 2019-02-21 ENCOUNTER — Encounter: Payer: Self-pay | Admitting: Registered Nurse

## 2019-02-21 ENCOUNTER — Encounter
Admission: EM | Disposition: A | Discharge status: Home / Self Care | Payer: Self-pay | Source: Home / Self Care | Attending: Internal Medicine

## 2019-02-21 DIAGNOSIS — N179 Acute kidney failure, unspecified: Secondary | ICD-10-CM

## 2019-02-21 DIAGNOSIS — J9601 Acute respiratory failure with hypoxia: Secondary | ICD-10-CM

## 2019-02-21 DIAGNOSIS — E0810 Diabetes mellitus due to underlying condition with ketoacidosis without coma: Secondary | ICD-10-CM

## 2019-02-21 HISTORY — PX: ESOPHAGOGASTRODUODENOSCOPY: SHX5428

## 2019-02-21 LAB — BASIC METABOLIC PANEL
Anion gap: 16 — ABNORMAL HIGH (ref 5–15)
Anion gap: 11 (ref 5–15)
Anion gap: 9 (ref 5–15)
Anion gap: 16 — ABNORMAL HIGH (ref 5–15)
BUN: 39 mg/dL — ABNORMAL HIGH (ref 6–20)
BUN: 45 mg/dL — ABNORMAL HIGH (ref 6–20)
BUN: 54 mg/dL — ABNORMAL HIGH (ref 6–20)
BUN: 52 mg/dL — ABNORMAL HIGH (ref 6–20)
CO2: 8 mmol/L — ABNORMAL LOW (ref 22–32)
CO2: 10 mmol/L — ABNORMAL LOW (ref 22–32)
CO2: 29 mmol/L (ref 22–32)
CO2: 28 mmol/L (ref 22–32)
Calcium: 7.9 mg/dL — ABNORMAL LOW (ref 8.9–10.3)
Calcium: 8.1 mg/dL — ABNORMAL LOW (ref 8.9–10.3)
Calcium: 7.2 mg/dL — ABNORMAL LOW (ref 8.9–10.3)
Calcium: 6.8 mg/dL — ABNORMAL LOW (ref 8.9–10.3)
Chloride: 122 mmol/L — ABNORMAL HIGH (ref 98–111)
Chloride: 127 mmol/L — ABNORMAL HIGH (ref 98–111)
Chloride: 127 mmol/L — ABNORMAL HIGH (ref 98–111)
Chloride: 120 mmol/L — ABNORMAL HIGH (ref 98–111)
Creatinine, Ser: 2.09 mg/dL — ABNORMAL HIGH (ref 0.44–1.00)
Creatinine, Ser: 1.76 mg/dL — ABNORMAL HIGH (ref 0.44–1.00)
Creatinine, Ser: 1.41 mg/dL — ABNORMAL HIGH (ref 0.44–1.00)
Creatinine, Ser: 1.93 mg/dL — ABNORMAL HIGH (ref 0.44–1.00)
GFR calc Af Amer: 30 mL/min — ABNORMAL LOW (ref >60)
GFR calc Af Amer: 37 mL/min — ABNORMAL LOW (ref >60)
GFR calc Af Amer: 48 mL/min — ABNORMAL LOW (ref >60)
GFR calc Af Amer: 33 mL/min — ABNORMAL LOW (ref >60)
GFR calc non Af Amer: 26 mL/min — ABNORMAL LOW (ref >60)
GFR calc non Af Amer: 32 mL/min — ABNORMAL LOW (ref >60)
GFR calc non Af Amer: 41 mL/min — ABNORMAL LOW (ref >60)
GFR calc non Af Amer: 28 mL/min — ABNORMAL LOW (ref >60)
Glucose, Bld: 477 mg/dL — ABNORMAL HIGH (ref 70–99)
Glucose, Bld: 333 mg/dL — ABNORMAL HIGH (ref 70–99)
Glucose, Bld: 214 mg/dL — ABNORMAL HIGH (ref 70–99)
Glucose, Bld: 195 mg/dL — ABNORMAL HIGH (ref 70–99)
Potassium: 4.4 mmol/L (ref 3.5–5.1)
Potassium: 4.1 mmol/L (ref 3.5–5.1)
Potassium: 2.9 mmol/L — ABNORMAL LOW (ref 3.5–5.1)
Potassium: 2.6 mmol/L — CL (ref 3.5–5.1)
Sodium: 146 mmol/L — ABNORMAL HIGH (ref 135–145)
Sodium: 148 mmol/L — ABNORMAL HIGH (ref 135–145)
Sodium: 165 mmol/L (ref 135–145)
Sodium: 164 mmol/L (ref 135–145)

## 2019-02-21 LAB — CBC WITH DIFFERENTIAL/PLATELET
Abs Immature Granulocytes: 0.22 10*3/uL — ABNORMAL HIGH (ref 0.00–0.07)
Basophils Absolute: 0 10*3/uL (ref 0.0–0.1)
Basophils Relative: 0 %
Eosinophils Absolute: 0 10*3/uL (ref 0.0–0.5)
Eosinophils Relative: 0 %
HCT: 26.4 % — ABNORMAL LOW (ref 36.0–46.0)
Hemoglobin: 8.5 g/dL — ABNORMAL LOW (ref 12.0–15.0)
Immature Granulocytes: 1 %
Lymphocytes Relative: 12 %
Lymphs Abs: 2.5 10*3/uL (ref 0.7–4.0)
MCH: 29.7 pg (ref 26.0–34.0)
MCHC: 32.2 g/dL (ref 30.0–36.0)
MCV: 92.3 fL (ref 80.0–100.0)
Monocytes Absolute: 1 10*3/uL (ref 0.1–1.0)
Monocytes Relative: 5 %
Neutro Abs: 17.8 10*3/uL — ABNORMAL HIGH (ref 1.7–7.7)
Neutrophils Relative %: 82 %
Platelets: 298 10*3/uL (ref 150–400)
RBC: 2.86 MIL/uL — ABNORMAL LOW (ref 3.87–5.11)
RDW: 13.1 % (ref 11.5–15.5)
WBC: 21.6 10*3/uL — ABNORMAL HIGH (ref 4.0–10.5)
nRBC: 0 % (ref 0.0–0.2)

## 2019-02-21 LAB — MAGNESIUM
Magnesium: 2.6 mg/dL — ABNORMAL HIGH (ref 1.7–2.4)
Magnesium: 1.4 mg/dL — ABNORMAL LOW (ref 1.7–2.4)

## 2019-02-21 LAB — BLOOD GAS, ARTERIAL
Acid-Base Excess: 21.1 mmol/L — ABNORMAL HIGH (ref 0.0–2.0)
Bicarbonate: 47.9 mmol/L — ABNORMAL HIGH (ref 20.0–28.0)
FIO2: 1
O2 Saturation: 100 %
Patient temperature: 37
pCO2 arterial: 60 mmHg — ABNORMAL HIGH (ref 32.0–48.0)
pH, Arterial: 7.51 — ABNORMAL HIGH (ref 7.350–7.450)
pO2, Arterial: 453 mmHg — ABNORMAL HIGH (ref 83.0–108.0)

## 2019-02-21 LAB — URINALYSIS, COMPLETE (UACMP) WITH MICROSCOPIC
Bilirubin Urine: NEGATIVE
Glucose, UA: >=500 mg/dL — AB
Ketones, ur: 80 mg/dL — AB
Leukocytes,Ua: NEGATIVE
Nitrite: NEGATIVE
Protein, ur: 30 mg/dL — AB
Specific Gravity, Urine: 1.016 (ref 1.005–1.030)
Squamous Epithelial / LPF: NONE SEEN (ref 0–5)
pH: 5 (ref 5.0–8.0)

## 2019-02-21 LAB — CBC
HCT: 28.6 % — ABNORMAL LOW (ref 36.0–46.0)
Hemoglobin: 10.3 g/dL — ABNORMAL LOW (ref 12.0–15.0)
MCH: 29.7 pg (ref 26.0–34.0)
MCHC: 36 g/dL (ref 30.0–36.0)
MCV: 82.4 fL (ref 80.0–100.0)
Platelets: 144 10*3/uL — ABNORMAL LOW (ref 150–400)
RBC: 3.47 MIL/uL — ABNORMAL LOW (ref 3.87–5.11)
RDW: 13.1 % (ref 11.5–15.5)
WBC: 15.3 10*3/uL — ABNORMAL HIGH (ref 4.0–10.5)
nRBC: 0.1 % (ref 0.0–0.2)

## 2019-02-21 LAB — GLUCOSE, CAPILLARY
Glucose-Capillary: 127 mg/dL — ABNORMAL HIGH (ref 70–99)
Glucose-Capillary: 132 mg/dL — ABNORMAL HIGH (ref 70–99)
Glucose-Capillary: 150 mg/dL — ABNORMAL HIGH (ref 70–99)
Glucose-Capillary: 175 mg/dL — ABNORMAL HIGH (ref 70–99)
Glucose-Capillary: 182 mg/dL — ABNORMAL HIGH (ref 70–99)
Glucose-Capillary: 195 mg/dL — ABNORMAL HIGH (ref 70–99)
Glucose-Capillary: 199 mg/dL — ABNORMAL HIGH (ref 70–99)
Glucose-Capillary: 210 mg/dL — ABNORMAL HIGH (ref 70–99)
Glucose-Capillary: 241 mg/dL — ABNORMAL HIGH (ref 70–99)
Glucose-Capillary: 246 mg/dL — ABNORMAL HIGH (ref 70–99)
Glucose-Capillary: 250 mg/dL — ABNORMAL HIGH (ref 70–99)
Glucose-Capillary: 293 mg/dL — ABNORMAL HIGH (ref 70–99)
Glucose-Capillary: 325 mg/dL — ABNORMAL HIGH (ref 70–99)
Glucose-Capillary: 361 mg/dL — ABNORMAL HIGH (ref 70–99)
Glucose-Capillary: 422 mg/dL — ABNORMAL HIGH (ref 70–99)
Glucose-Capillary: 435 mg/dL — ABNORMAL HIGH (ref 70–99)
Glucose-Capillary: 504 mg/dL (ref 70–99)

## 2019-02-21 LAB — TROPONIN I: Troponin I: 0.26 ng/mL (ref <0.03)

## 2019-02-21 LAB — PROTIME-INR
INR: 1.5 — ABNORMAL HIGH (ref 0.8–1.2)
Prothrombin Time: 17.7 seconds — ABNORMAL HIGH (ref 11.4–15.2)

## 2019-02-21 LAB — ABO/RH: ABO/RH(D): O POS

## 2019-02-21 LAB — PHOSPHORUS: Phosphorus: 1 mg/dL — CL (ref 2.5–4.6)

## 2019-02-21 LAB — APTT: aPTT: 32 seconds (ref 24–36)

## 2019-02-21 LAB — LACTIC ACID, PLASMA: Lactic Acid, Venous: 3.8 mmol/L (ref 0.5–1.9)

## 2019-02-21 LAB — PROCALCITONIN: Procalcitonin: 0.16 ng/mL

## 2019-02-21 LAB — PREPARE RBC (CROSSMATCH)

## 2019-02-21 SURGERY — EGD (ESOPHAGOGASTRODUODENOSCOPY)
Anesthesia: General

## 2019-02-21 MED ORDER — LACTATED RINGERS IV BOLUS
1000.0000 mL | Freq: Once | INTRAVENOUS | Status: AC
  Administered 2019-02-21: 1000 mL via INTRAVENOUS

## 2019-02-21 MED ORDER — HYDROCORTISONE NICU INJ SYRINGE 50 MG/ML: 50.0000 mg | Freq: Four times a day (QID) | INTRAVENOUS | Status: DC

## 2019-02-21 MED ORDER — FENTANYL 2500MCG IN NS 250ML (10MCG/ML) PREMIX INFUSION
0.0000 ug/h | INTRAVENOUS | Status: DC
  Administered 2019-02-21: 100 ug/h via INTRAVENOUS
  Administered 2019-02-22: 50 ug/h via INTRAVENOUS
  Administered 2019-02-23: 200 ug/h via INTRAVENOUS
  Administered 2019-02-25: 150 ug/h via INTRAVENOUS
  Filled 2019-02-21 (×4): qty 250

## 2019-02-21 MED ORDER — SODIUM BICARBONATE 8.4 % IV SOLN
150.0000 meq | Freq: Once | INTRAVENOUS | Status: AC
  Administered 2019-02-21: 150 meq via INTRAVENOUS

## 2019-02-21 MED ORDER — NOREPINEPHRINE 4 MG/250ML-% IV SOLN
0.0000 ug/min | INTRAVENOUS | Status: DC
  Administered 2019-02-21: 13:00:00 25 ug/min via INTRAVENOUS

## 2019-02-21 MED ORDER — SODIUM CHLORIDE 0.9% IV SOLUTION
Freq: Once | INTRAVENOUS | Status: AC
  Administered 2019-02-21: 17:00:00 via INTRAVENOUS

## 2019-02-21 MED ORDER — FENTANYL CITRATE (PF) 100 MCG/2ML IJ SOLN
50.0000 ug | Freq: Once | INTRAMUSCULAR | Status: DC
  Filled 2019-02-21: qty 2

## 2019-02-21 MED ORDER — POTASSIUM PHOSPHATES 15 MMOLE/5ML IV SOLN
30.0000 mmol | Freq: Once | INTRAVENOUS | Status: AC
  Administered 2019-02-21: 22:00:00 30 mmol via INTRAVENOUS
  Filled 2019-02-21: qty 10

## 2019-02-21 MED ORDER — MAGNESIUM SULFATE IN D5W 1-5 GM/100ML-% IV SOLN
1.0000 g | Freq: Once | INTRAVENOUS | Status: AC
  Administered 2019-02-21: 1 g via INTRAVENOUS
  Filled 2019-02-21: qty 100

## 2019-02-21 MED ORDER — PHENYLEPHRINE HCL-NACL 10-0.9 MG/250ML-% IV SOLN
0.0000 ug/min | INTRAVENOUS | Status: DC
  Administered 2019-02-21: 30 ug/min via INTRAVENOUS
  Administered 2019-02-21: 100 ug/min via INTRAVENOUS
  Administered 2019-02-21: 20 ug/min via INTRAVENOUS
  Filled 2019-02-21 (×5): qty 250

## 2019-02-21 MED ORDER — FENTANYL CITRATE (PF) 100 MCG/2ML IJ SOLN
200.0000 ug | Freq: Once | INTRAMUSCULAR | Status: AC
  Administered 2019-02-21: 200 ug via INTRAVENOUS
  Filled 2019-02-21: qty 4

## 2019-02-21 MED ORDER — MIDAZOLAM HCL 2 MG/2ML IJ SOLN: 2.0000 mg | INTRAMUSCULAR | Status: DC | PRN

## 2019-02-21 MED ORDER — SODIUM CHLORIDE 0.9 % IV SOLN
2.0000 g | INTRAVENOUS | Status: DC
  Administered 2019-02-21: 2 g via INTRAVENOUS
  Filled 2019-02-21: qty 2

## 2019-02-21 MED ORDER — SODIUM CHLORIDE 0.9 % IV SOLN
250.0000 mg | INTRAVENOUS | Status: AC
  Administered 2019-02-21: 250 mg via INTRAVENOUS
  Filled 2019-02-21: qty 5

## 2019-02-21 MED ORDER — STERILE WATER FOR INJECTION IV SOLN
INTRAVENOUS | Status: DC
  Administered 2019-02-21: 13:00:00 via INTRAVENOUS
  Filled 2019-02-21 (×4): qty 850

## 2019-02-21 MED ORDER — CHLORHEXIDINE GLUCONATE 0.12% ORAL RINSE (MEDLINE KIT)
15.0000 mL | Freq: Two times a day (BID) | OROMUCOSAL | Status: DC
  Administered 2019-02-21 – 2019-02-25 (×8): 15 mL via OROMUCOSAL

## 2019-02-21 MED ORDER — FENTANYL BOLUS VIA INFUSION
50.0000 ug | INTRAVENOUS | Status: DC | PRN
  Administered 2019-02-24 (×3): 50 ug via INTRAVENOUS
  Filled 2019-02-21: qty 50

## 2019-02-21 MED ORDER — SODIUM CHLORIDE 0.9 % IV BOLUS
1000.0000 mL | Freq: Once | INTRAVENOUS | Status: AC
  Administered 2019-02-21: 1000 mL via INTRAVENOUS

## 2019-02-21 MED ORDER — SODIUM CHLORIDE 0.9 % IV SOLN
0.0000 ug/min | INTRAVENOUS | Status: DC
  Administered 2019-02-21: 14:00:00 20 ug/min via INTRAVENOUS
  Administered 2019-02-21 (×2): 220 ug/min via INTRAVENOUS
  Administered 2019-02-22: 75 ug/min via INTRAVENOUS
  Administered 2019-02-22: 220 ug/min via INTRAVENOUS
  Filled 2019-02-21 (×5): qty 40

## 2019-02-21 MED ORDER — NOREPINEPHRINE 16 MG/250ML-% IV SOLN
0.0000 ug/min | INTRAVENOUS | Status: DC
  Administered 2019-02-21: 40 ug/min via INTRAVENOUS
  Administered 2019-02-21: 60 ug/min via INTRAVENOUS
  Administered 2019-02-22: 40 ug/min via INTRAVENOUS
  Administered 2019-02-22: 30 ug/min via INTRAVENOUS
  Administered 2019-02-22: 40 ug/min via INTRAVENOUS
  Filled 2019-02-21 (×6): qty 250

## 2019-02-21 MED ORDER — METRONIDAZOLE IN NACL 5-0.79 MG/ML-% IV SOLN
500.0000 mg | Freq: Four times a day (QID) | INTRAVENOUS | Status: DC
  Administered 2019-02-21 – 2019-02-23 (×6): 500 mg via INTRAVENOUS
  Filled 2019-02-21 (×10): qty 100

## 2019-02-21 MED ORDER — SODIUM CHLORIDE 0.9 % IV SOLN
8.0000 mg/h | INTRAVENOUS | Status: DC
  Administered 2019-02-21 – 2019-02-22 (×4): 8 mg/h via INTRAVENOUS
  Filled 2019-02-21 (×6): qty 80

## 2019-02-21 MED ORDER — VASOPRESSIN 20 UNIT/ML IV SOLN
0.0300 [IU]/min | INTRAVENOUS | Status: DC
  Administered 2019-02-21: 13:00:00 0.03 [IU]/min via INTRAVENOUS
  Filled 2019-02-21: qty 2

## 2019-02-21 MED ORDER — POTASSIUM CHLORIDE 10 MEQ/50ML IV SOLN
10.0000 meq | INTRAVENOUS | Status: AC
  Administered 2019-02-21 (×4): 10 meq via INTRAVENOUS
  Filled 2019-02-21 (×4): qty 50

## 2019-02-21 MED ORDER — DEXTROSE IN LACTATED RINGERS 5 % IV SOLN
INTRAVENOUS | Status: DC
  Administered 2019-02-21: 11:00:00 via INTRAVENOUS

## 2019-02-21 MED ORDER — SODIUM CHLORIDE 0.9 % IV SOLN
50.0000 ug/h | INTRAVENOUS | Status: DC
  Administered 2019-02-21 – 2019-02-23 (×5): 50 ug/h via INTRAVENOUS
  Filled 2019-02-21 (×6): qty 1

## 2019-02-21 MED ORDER — SODIUM CHLORIDE (PF) 0.9 % IJ SOLN
PREFILLED_SYRINGE | INTRAMUSCULAR | Status: DC | PRN
  Administered 2019-02-21: 15:00:00 1 mL

## 2019-02-21 MED ORDER — VECURONIUM BROMIDE 10 MG IV SOLR
10.0000 mg | Freq: Once | INTRAVENOUS | Status: AC
  Administered 2019-02-21: 10 mg via INTRAVENOUS
  Filled 2019-02-21: qty 10

## 2019-02-21 MED ORDER — ORAL CARE MOUTH RINSE
15.0000 mL | OROMUCOSAL | Status: DC
  Administered 2019-02-22 – 2019-02-25 (×32): 15 mL via OROMUCOSAL

## 2019-02-21 MED ORDER — LACTATED RINGERS IV SOLN
INTRAVENOUS | Status: DC
  Administered 2019-02-21 – 2019-02-22 (×2): via INTRAVENOUS

## 2019-02-21 MED ORDER — MIDAZOLAM HCL 2 MG/2ML IJ SOLN
4.0000 mg | Freq: Once | INTRAMUSCULAR | Status: AC
  Administered 2019-02-21: 11:00:00 4 mg via INTRAVENOUS
  Filled 2019-02-21: qty 4

## 2019-02-21 MED ORDER — EPINEPHRINE 1 MG/10ML IJ SOSY
PREFILLED_SYRINGE | INTRAMUSCULAR | Status: AC
  Filled 2019-02-21: qty 10

## 2019-02-21 MED ORDER — SODIUM CHLORIDE 0.9 % IV SOLN
2.0000 g | Freq: Every day | INTRAVENOUS | Status: DC
  Administered 2019-02-21 – 2019-02-22 (×2): 2 g via INTRAVENOUS
  Filled 2019-02-21 (×2): qty 2
  Filled 2019-02-21: qty 20

## 2019-02-21 MED ORDER — HYDROCORTISONE NA SUCCINATE PF 100 MG IJ SOLR
50.0000 mg | Freq: Four times a day (QID) | INTRAMUSCULAR | Status: DC
  Administered 2019-02-21 – 2019-02-23 (×7): 50 mg via INTRAVENOUS
  Filled 2019-02-21 (×7): qty 2

## 2019-02-21 MED ORDER — PANTOPRAZOLE SODIUM 40 MG IV SOLR: 40.0000 mg | Freq: Two times a day (BID) | INTRAVENOUS | Status: DC

## 2019-02-21 MED ORDER — LACTATED RINGERS IV BOLUS
1000.0000 mL | Freq: Once | INTRAVENOUS | Status: AC
  Administered 2019-02-21: 07:00:00 1000 mL via INTRAVENOUS

## 2019-02-21 NOTE — Progress Notes (Signed)
Pharmacy Antibiotic Note  Bethany James is a 58 y.o. female admitted on 02/20/2019 with sepsis.  Pharmacy has been consulted for cefepime dosing.  Plan: Will start cefepime 2g IV q24h  Height: 4\' 9"  (144.8 cm) Weight: 107 lb 2.3 oz (48.6 kg) IBW/kg (Calculated) : 38.6  Temp (24hrs), Avg:98.1 F (36.7 C), Min:97.8 F (36.6 C), Max:98.6 F (37 C)  Recent Labs  Lab 02/20/19 1913 02/20/19 2233 02/21/19 0221  WBC 27.7* 27.3*  --   CREATININE 2.76* 2.15* 2.09*    Estimated Creatinine Clearance: 20 mL/min (A) (by C-G formula based on SCr of 2.09 mg/dL (H)).    No Known Allergies  Thank you for allowing pharmacy to be a part of this patient's care.  Thomasene Ripple, PharmD, BCPS Clinical Pharmacist 02/21/2019

## 2019-02-21 NOTE — Consult Note (Addendum)
GI Inpatient Consult Note  Reason for Consult: Acute GI Bleed   Attending Requesting Consult: Dr. Auburn BilberryShreyang Patel, MD  History of Present Illness: Bethany James is a 58 y.o. female seen for evaluation of acute blood loss anemia, hematemesis at the request of Dr. Allena KatzPatel. Patient was admitted yesterday for one day history of persistent worsening nausea and vomiting, including hematemesis. She was found to be in DKA with a glucose in the 800s and anion gap of 28, WBC 27.7. She was admitted to the stepdown unit. Patient became hemodynamically unstable today requiring pressors with mental status changes. Pt was emergently intubated, during which she vomited up dark red blood -appx 500 cc. She is currently on a ventilator with an OG tube in place. There is a reported drinking history per family, but she supposedly stopped one year ago. It is unknown at this time if she has a history of esophageal varices.   I do not see any history of previous luminal evaluation. A complete review of symptoms was unable to be performed due to patient's mental status. I have spoken to the patient's daughter Raquel James- [place]. She reports her mother takes appx 4 BC Powder's per month, possibly more for pain. Hemoglobin 0600 this morning was 8.5 (on admission was 14.0).   Last Colonoscopy: N/A Last Endoscopy: N/A   Past Medical History:  Past Medical History:  Diagnosis Date  . Diabetes mellitus without complication [facility](HCC)     Problem List: Patient Active Problem List   Diagnosis Date Noted  . DKA (diabetic ketoacidoses) (HCC) 02/20/2019  . AKI (acute kidney injury) (HCC) 02/20/2019    Past Surgical History: Past Surgical History:  Procedure Laterality Date  . ABDOMINAL HYSTERECTOMY    . CHOLECYSTECTOMY      Allergies: No Known Allergies  Home Medications: Medications Prior to Admission  Medication Sig Dispense Refill Last Dose  . insulin aspart (NOVOLOG) 100 UNIT/ML injection Inject 5 Units into the skin 3 (three)  times daily before meals.    02/20/2019 at 0800  . insulin detemir (LEVEMIR) 100 UNIT/ML injection Inject 15 Units into the skin at bedtime.   02/19/2019 at 2100   Home medication reconciliation was completed with the patient.   Scheduled Inpatient Medications:   . fentaNYL (SUBLIMAZE) injection  50 mcg Intravenous Once  . [START ON 02/24/2019] pantoprazole  40 mg Intravenous Q12H    Continuous Inpatient Infusions:   . sodium chloride Stopped (02/20/19 2219)  . sodium chloride Stopped (02/21/19 0959)  . dextrose 5% lactated ringers 125 mL/hr at 02/21/19 1114  . fentaNYL infusion INTRAVENOUS 100 mcg/hr (02/21/19 1129)  . insulin 13.8 Units/hr (02/21/19 1021)  . octreotide  (SANDOSTATIN)    IV infusion 50 mcg/hr (02/21/19 1127)  . pantoprozole (PROTONIX) infusion 8 mg/hr (02/21/19 1011)  . phenylephrine (NEO-SYNEPHRINE) Adult infusion 100 mcg/min (02/21/19 1011)    PRN Inpatient Medications:  sodium chloride, fentaNYL, midazolam  Family History: family history is not on file.  The patient's family history is negative for inflammatory bowel disorders, GI malignancy, or solid organ transplantation.  Social History:   reports that she has never smoked. She has never used smokeless tobacco. She reports previous alcohol use. She reports that she does not use drugs. The patient denies ETOH, tobacco, or drug use.   Review of Systems: Unable to be performed due to patient's mental status   Physical Examination: BP (!) 83/47   Pulse (!) 118   Temp 98.9 F (37.2 C) (Oral)   Resp (!) 30  Ht 4\' 9"  (1.448 m)   Wt 48.6 kg   SpO2 100%   BMI 23.19 kg/m    Gen: Pt is intubated and on ventilator, critically ill HEENT: Normocephalic Neck: supple, no JVD or thyromegaly Chest: Rhonchi and wheezing bilaterally CV: RRR, no m/g/c/r Abd: distended, +BS in all four quadrants; no HSM, guarding, ridigity, or rebound tenderness Ext: no edema, well perfused with 2+ pulses, Skin: no rash or  lesions noted Lymph: no LAD  Data: Lab Results  Component Value Date   WBC 21.6 (H) 02/21/2019   HGB 8.5 (L) 02/21/2019   HCT 26.4 (L) 02/21/2019   MCV 92.3 02/21/2019   PLT 298 02/21/2019   Recent Labs  Lab 02/20/19 1913 02/20/19 2233 02/21/19 0551  HGB 14.0 11.2* 8.5*   Lab Results  Component Value Date   NA 148 (H) 02/21/2019   K 4.1 02/21/2019   CL 127 (H) 02/21/2019   CO2 10 (L) 02/21/2019   BUN 45 (H) 02/21/2019   CREATININE 1.76 (H) 02/21/2019   Lab Results  Component Value Date   ALT 11 02/20/2019   AST 18 02/20/2019   ALKPHOS 113 02/20/2019   BILITOT 1.1 02/20/2019   Recent Labs  Lab 02/21/19 0713  APTT 32  INR 1.5*   Assessment/Plan:  58 y/o Caucasian female with a PMH of DM admitted yesterday for DKA and consulted for acute GI bleed, symptomatic anemia  1. Acute upper GI bleed 2. Severe acute respiratory failure with severe hemorrhagic shock 3. Intubated and on ventilator - OG tube in place  - Pt progressively worsened this morning, requiring pressors and intubation - Acute blood loss of appx 500 cc via aspiration of gastric contents - Upper GI bleed most likely, differential includes peptic ulcer disease, gastritis, esophageal variceal bleeding, duodenitis, AVMs, GAVE, Mallory-Weiss tear, Dieulafoy's lesion, malignancy, small bowel bleeding - Advise emergent EGD this afternoon in ICU with Dr. Norma Fredrickson - I have called and spoken with patient's daughter Bing Neighbors. We discussed procedure indications, risks, and potential complications, including bleeding, perforation, infection, anesthesia complications, cardiac/respiratory complications, benefits and alternatives of EGD. Patient's daughter consents to the procedure. - Patient remains critically ill.   - Remain NPO. Agree with PPI. Continue octreotide.  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 Please  call with questions or concerns.  Mickle Mallory [facility] Gastroenterology [phone] (Cell)

## 2019-02-21 NOTE — Progress Notes (Signed)
[employer] - [place] at [facility]                                                                                                                                                                                  Patient Demographics   Bethany James, is a 58 y.o. female, DOB - 08-Aug-1961, ZOX:[id number]  Admit date - 02/20/2019   Admitting Physician Oralia Manis, MD  Outpatient Primary MD for the patient is System, Pcp Not In   LOS - 1  Subjective: Patient seen this morning she remains drowsy and lethargic not able to provide any review of systems Continues to require pressors   Review of Systems:   CONSTITUTIONAL: Lethargic  Vitals:   Vitals:   02/21/19 0700 02/21/19 0800 02/21/19 0900 02/21/19 1000  BP: (!) 84/57 91/63 (!) 86/58 (!) 83/47  Pulse: (!) 121 (!) 114 (!) 118   Resp: (!) 24 (!) 24 (!) 24 (!) 30  Temp:  98.9 F (37.2 C)    TempSrc:  Oral    SpO2: 100% 100% 100%   Weight:      Height:        Wt Readings from Last 3 Encounters:  02/20/19 48.6 kg  08/13/18 59 kg  04/06/18 71.2 kg     Intake/Output Summary (Last 24 hours) at 02/21/2019 1140 Last data filed at 02/21/2019 1011 Gross per 24 hour  Intake 6943.96 ml  Output 436 ml  Net 6507.96 ml    Physical Exam:   GENERAL: Critically ill-appearing.  HEAD, EYES, EARS, NOSE AND THROAT: Atraumatic, normocephalic. Extraocular muscles are intact. Pupils equal and reactive to light. Sclerae anicteric. No conjunctival injection. No oro-pharyngeal erythema.  NECK: Supple. There is no jugular venous distention. No bruits, no lymphadenopathy, no thyromegaly.  HEART: Regular rate and rhythm,. No murmurs, no rubs, no clicks.  LUNGS: Clear to auscultation bilaterally. No rales or rhonchi. No wheezes.  ABDOMEN: Soft, flat, nontender, nondistended. Has good bowel sounds. No hepatosplenomegaly appreciated.  EXTREMITIES: No evidence of any cyanosis, clubbing, or peripheral edema.  +2 pedal and radial pulses  bilaterally.  NEUROLOGIC: Patient drowsy SKIN: Moist and warm with no rashes appreciated.  Psych: Not anxious, depressed LN: No inguinal LN enlargement    Antibiotics   Anti-infectives (From admission, onward)   Start     Dose/Rate Route Frequency Ordered Stop   02/21/19 0145  ceFEPIme (MAXIPIME) 2 g in sodium chloride 0.9 % 100 mL IVPB  Status:  Discontinued     2 g 200 mL/hr over 30 Minutes Intravenous Every 24 hours 02/21/19 0141 02/21/19 0652      Medications   Scheduled Meds: . fentaNYL (SUBLIMAZE) injection  50  mcg Intravenous Once  . [START ON 02/24/2019] pantoprazole  40 mg Intravenous Q12H   Continuous Infusions: . sodium chloride Stopped (02/20/19 2219)  . sodium chloride Stopped (02/21/19 0959)  . dextrose 5% lactated ringers 125 mL/hr at 02/21/19 1114  . fentaNYL infusion INTRAVENOUS 100 mcg/hr (02/21/19 1129)  . insulin 13.8 Units/hr (02/21/19 1021)  . octreotide  (SANDOSTATIN)    IV infusion 50 mcg/hr (02/21/19 1127)  . pantoprozole (PROTONIX) infusion 8 mg/hr (02/21/19 1011)  . phenylephrine (NEO-SYNEPHRINE) Adult infusion 100 mcg/min (02/21/19 1011)   PRN Meds:.sodium chloride, fentaNYL, midazolam   Data Review:   Micro Results Recent Results (from the past 240 hour(s))  MRSA PCR Screening     Status: None   Collection Time: 02/20/19 10:07 PM  Result Value Ref Range Status   MRSA by PCR NEGATIVE NEGATIVE Final    Comment:        The GeneXpert MRSA Assay (FDA approved for NASAL specimens only), is one component of a comprehensive MRSA colonization surveillance program. It is not intended to diagnose MRSA infection nor to guide or monitor treatment for MRSA infections. Performed at [facility], [address] [place], [place] [address]   CULTURE, BLOOD (ROUTINE X 2) w Reflex to ID Panel     Status: None (Preliminary result)   Collection Time: 02/21/19  2:19 AM  Result Value Ref Range Status   Specimen Description BLOOD BLOOD LEFT  FOREARM  Final   Special Requests   Final    BOTTLES DRAWN AEROBIC ONLY Blood Culture results may not be optimal due to an inadequate volume of blood received in culture bottles   Culture   Final    NO GROWTH < 12 HOURS Performed at [facility], [address], [place], [place] [address]    Report Status PENDING  Incomplete  CULTURE, BLOOD (ROUTINE X 2) w Reflex to ID Panel     Status: None (Preliminary result)   Collection Time: 02/21/19  2:19 AM  Result Value Ref Range Status   Specimen Description BLOOD BLOOD LEFT WRIST  Final   Special Requests   Final    BOTTLES DRAWN AEROBIC ONLY Blood Culture results may not be optimal due to an inadequate volume of blood received in culture bottles   Culture   Final    NO GROWTH < 12 HOURS Performed at [facility], [address], [place], [place] [address]    Report Status PENDING  Incomplete    Radiology Reports Dg Chest Port 1 View  Result Date: 02/21/2019 CLINICAL DATA:  Leukocytosis. EXAM: PORTABLE CHEST 1 VIEW COMPARISON:  Radiographs of February 20, 2019. FINDINGS: The heart size and mediastinal contours are within normal limits. Both lungs are clear. The visualized skeletal structures are unremarkable. IMPRESSION: No active disease. Electronically Signed   By: Lupita Raider M.D.   On: 02/21/2019 08:17   Dg Abdomen Acute W/chest  Result Date: 02/20/2019 CLINICAL DATA:  Abdominal pain EXAM: DG ABDOMEN ACUTE W/ 1V CHEST COMPARISON:  None. FINDINGS: Moderate stool burden in the colon. The bowel gas pattern is normal. There is no evidence of free intraperitoneal air. No suspicious radio-opaque calculi or other significant radiographic abnormality is seen. Heart size and mediastinal contours are within normal limits. Both lungs are clear. IMPRESSION: Moderate stool burden.  No acute findings. Electronically Signed   By: Charlett Nose M.D.   On: 02/20/2019 20:05     CBC Recent Labs  Lab 02/20/19 1913 02/20/19 2233  02/21/19 0551  WBC 27.7* 27.3* 21.6*  HGB 14.0 11.2* 8.5*  HCT 44.5 36.8 26.4*  PLT 445* 377 298  MCV 94.5 97.4 92.3  MCH 29.7 29.6 29.7  MCHC 31.5 30.4 32.2  RDW 13.2 13.2 13.1  LYMPHSABS  --   --  2.5  MONOABS  --   --  1.0  EOSABS  --   --  0.0  BASOSABS  --   --  0.0    Chemistries  Recent Labs  Lab 02/20/19 1913 02/20/19 2233 02/21/19 0221 02/21/19 0551  NA 135 141 146* 148*  K 6.5* 6.4* 4.4 4.1  CL 98 111 122* 127*  CO2 9* <7* 8* 10*  GLUCOSE 866* 705* 477* 333*  BUN 34* 34* 39* 45*  CREATININE 2.76* 2.15* 2.09* 1.76*  CALCIUM 9.2 8.1* 7.9* 8.1*  MG  --  2.6*  --   --   AST 18  --   --   --   ALT 11  --   --   --   ALKPHOS 113  --   --   --   BILITOT 1.1  --   --   --    ------------------------------------------------------------------------------------------------------------------ estimated creatinine clearance is 23.7 mL/min (A) (by C-G formula based on SCr of 1.76 mg/dL (H)). ------------------------------------------------------------------------------------------------------------------ No results for input(s): HGBA1C in the last 72 hours. ------------------------------------------------------------------------------------------------------------------ No results for input(s): CHOL, HDL, LDLCALC, TRIG, CHOLHDL, LDLDIRECT in the last 72 hours. ------------------------------------------------------------------------------------------------------------------ No results for input(s): TSH, T4TOTAL, T3FREE, THYROIDAB in the last 72 hours.  Invalid input(s): FREET3 ------------------------------------------------------------------------------------------------------------------ No results for input(s): VITAMINB12, FOLATE, FERRITIN, TIBC, IRON, RETICCTPCT in the last 72 hours.  Coagulation profile Recent Labs  Lab 02/21/19 0713  INR 1.5*    No results for input(s): DDIMER in the last 72 hours.  Cardiac Enzymes No results for input(s): CKMB, TROPONINI,  MYOGLOBIN in the last 168 hours.  Invalid input(s): CK ------------------------------------------------------------------------------------------------------------------ Invalid input(s): POCBNP    Assessment & Plan  Patient is 58 year old admitted with nausea and vomiting  DKA (diabetic ketoacidoses) (HCC) -continue insulin therapy transition to long-acting insulin once patient stable  Hypotension etiology unclear continue IV fluids continue pressors Chest x-ray is negative Starts having fevers consider antibiotics    AKI (acute kidney injury) (HCC) -aggressive IV fluids as above, avoid nephrotoxins and monitor  Leukocytosis possible reactive  Anemia follow CBC  Miscellaneous SCDs for DVT prophylaxis      Code Status Orders  (From admission, onward)         Start     Ordered   02/20/19 2207  Full code  Continuous     02/20/19 2206        Code Status History    This patient has a current code status but no historical code status.    Advance Directive Documentation     Most Recent Value  Type of Advance Directive  Healthcare Power of Attorney [Daughter- Tasha]  Pre-existing out of facility DNR order (yellow form or pink MOST form)  -  "MOST" Form in Place?  -           Consults pulm ccm  DVT Prophylaxis scd's  Lab Results  Component Value Date   PLT 298 02/21/2019     Time Spent in minutes  35min Greater than 50% of time spent in care coordination and counseling patient regarding the condition and plan of care.   Auburn BilberryShreyang Herta Hink M.D on 02/21/2019 at 11:40 AM  Between 7am to 6pm - Pager -  [phone]  After 6pm go to [email or web address] - Proofreader  [employer]   Office  [phone]

## 2019-02-21 NOTE — Procedures (Signed)
Central Venous Catheter Placement:TRIPLE LUMEN Indication: Patient receiving vesicant or irritant drug.; Patient receiving intravenous therapy for longer than 5 days.; Patient has limited or no vascular access.   Consent:emergent    Hand washing performed prior to starting the procedure.   Procedure:   An active timeout was performed and correct patient, name, & ID confirmed.   Patient was positioned correctly for central venous access.  Patient was prepped using strict sterile technique including chlorohexadine preps, sterile drape, sterile gown and sterile gloves.    The area was prepped, draped and anesthetized in the usual sterile manner. Patient comfort was obtained.    A triple lumen catheter was placed in LEFT FEMORAL VEIN There was good blood return, catheter caps were placed on lumens, catheter flushed easily, the line was secured and a sterile dressing and BIO-PATCH applied.   Ultrasound was used to visualize vasculature and guidance of needle.   Number of Attempts: 1 Complications:none Estimated Blood Loss: none Chest Radiograph indicated and ordered.  Operator: Arsh Feutz.   Lucie Leather, M.D.  [facility]  Medical Director [facility] Medical Director [facility] Cardio-Pulmonary Department

## 2019-02-21 NOTE — Procedures (Signed)
Arterial Line Placement: Indication: Frequent blood draws; Invasive BP monitoring.   Consent: Emergent.   Hand washing performed prior to starting the procedure.   Procedure: An active timeout was performed and correct patient, name, & ID confirmed. Physicial exam was performed to ensure adequate perfusion.  Using sterile technique, an aterial line was inserted into the RT Femoral artery.  Catheter threaded and the needle was removed with appropriate blood return.  Arterial waveform was noted.  After the procedure, the patient's extremities were observed to be pink and warm.   Estimated Blood Loss: None .   Number of Attempts: 1.   Complications: None .  Operator: Kayleanna Lorman.   Bethany James, M.D.  [facility]  Medical Director [facility] Medical Director [facility] Cardio-Pulmonary Department     

## 2019-02-21 NOTE — Progress Notes (Signed)
Inpatient Diabetes Program Recommendations  AACE/ADA: New Consensus Statement on Inpatient Glycemic Control (2015)  Target Ranges:  Prepandial:   less than 140 mg/dL      Peak postprandial:   less than 180 mg/dL (1-2 hours)      Critically ill patients:  140 - 180 mg/dL   Lab Results  Component Value Date   GLUCAP 210 (H) 02/21/2019    Review of Glycemic Control  Results for NYERA, RESA (MRN [id number]) as of 02/21/2019 09:35  Ref. Range 02/21/2019 06:09 02/21/2019 07:14 02/21/2019 08:12 02/21/2019 09:12  Glucose-Capillary Latest Ref Range: 70 - 99 mg/dL 544 (H) 920 (H) 100 (H) 210 (H)   Diabetes history: DM  Outpatient Diabetes medications: Levemir 15 units q HS, Novolog 5 units tid with meals Current orders for Inpatient glycemic control:  IV insulin- DKA order set  Inpatient Diabetes Program Recommendations:   Results for ALEJANDRIA, PLAGER (MRN [id number]) as of 02/21/2019 09:35  Ref. Range 02/21/2019 05:51  Sodium Latest Ref Range: 135 - 145 mmol/L 148 (H)  Potassium Latest Ref Range: 3.5 - 5.1 mmol/L 4.1  Chloride Latest Ref Range: 98 - 111 mmol/L 127 (H)  CO2 Latest Ref Range: 22 - 32 mmol/L 10 (L)  Glucose Latest Ref Range: 70 - 99 mg/dL 325 (H)  BUN Latest Ref Range: 6 - 20 mg/dL 45 (H)  Creatinine Latest Ref Range: 0.44 - 1.00 mg/dL 4.98 (H)  Calcium Latest Ref Range: 8.9 - 10.3 mg/dL 8.1 (L)  Anion gap Latest Ref Range: 5 - 15  11   Patient remains on insulin drip.  CO2 still low.  Will follow.   Thanks,  Beryl Meager, RN, BC-ADM Inpatient Diabetes Coordinator Pager [phone] (8a-5p)

## 2019-02-21 NOTE — Progress Notes (Signed)
CRITICAL CARE NOTE  CC  Severe resp distress  SUBJECTIVE Patient was declining On pressors with mental status changes Increased WOB  Patient was emergently intubated, during which she vomited up dark red blood about 500 cc's +evidence of aspiration of gastric contents Patient with acute cardiac arrest for 1 minute CPR started Patient given 1mg  EPI and 3 amps bicarb        SIGNIFICANT EVENTS 4/22 admitted for DKA nad severe hemmorhagic shock 4/22 intubated, aspiration of gastric contents and blood #8 ETT and LEFT FEM LINE PLACED     REVIEW OF SYSTEMS  PATIENT IS UNABLE TO PROVIDE COMPLETE REVIEW OF SYSTEMS DUE TO SEVERE CRITICAL ILLNESS   PHYSICAL EXAMINATION:  GENERAL:critically ill appearing, +resp distress HEAD: Normocephalic, atraumatic.  EYES: Pupils equal, round, reactive to light.  No scleral icterus.  MOUTH: Moist mucosal membrane. NECK: Supple. No thyromegaly. No nodules. No JVD.  PULMONARY: +rhonchi, +wheezing CARDIOVASCULAR: S1 and S2. Regular rate and rhythm. No murmurs, rubs, or gallops.  GASTROINTESTINAL: Soft, nontender, -distended. No masses. Positive bowel sounds. No hepatosplenomegaly.  MUSCULOSKELETAL: No swelling, clubbing, or edema.  NEUROLOGIC: obtunded, GCS<8 SKIN:intact,warm,dry    CULTURE RESULTS   Recent Results (from the past 240 hour(s))  MRSA PCR Screening     Status: None   Collection Time: 02/20/19 10:07 PM  Result Value Ref Range Status   MRSA by PCR NEGATIVE NEGATIVE Final    Comment:        The GeneXpert MRSA Assay (FDA approved for NASAL specimens only), is one component of a comprehensive MRSA colonization surveillance program. It is not intended to diagnose MRSA infection nor to guide or monitor treatment for MRSA infections. Performed at [facility], [address] [place], [place] [address]   CULTURE, BLOOD (ROUTINE X 2) w Reflex to ID Panel     Status: None (Preliminary result)   Collection Time:  02/21/19  2:19 AM  Result Value Ref Range Status   Specimen Description BLOOD BLOOD LEFT FOREARM  Final   Special Requests   Final    BOTTLES DRAWN AEROBIC ONLY Blood Culture results may not be optimal due to an inadequate volume of blood received in culture bottles   Culture   Final    NO GROWTH < 12 HOURS Performed at [facility], [address], [place], [place] [address]    Report Status PENDING  Incomplete  CULTURE, BLOOD (ROUTINE X 2) w Reflex to ID Panel     Status: None (Preliminary result)   Collection Time: 02/21/19  2:19 AM  Result Value Ref Range Status   Specimen Description BLOOD BLOOD LEFT WRIST  Final   Special Requests   Final    BOTTLES DRAWN AEROBIC ONLY Blood Culture results may not be optimal due to an inadequate volume of blood received in culture bottles   Culture   Final    NO GROWTH < 12 HOURS Performed at [facility], [address], [place], [place] [address]    Report Status PENDING  Incomplete          IMAGING    Dg Chest Port 1 View  Result Date: 02/21/2019 CLINICAL DATA:  Leukocytosis. EXAM: PORTABLE CHEST 1 VIEW COMPARISON:  Radiographs of February 20, 2019. FINDINGS: The heart size and mediastinal contours are within normal limits. Both lungs are clear. The visualized skeletal structures are unremarkable. IMPRESSION: No active disease. Electronically Signed   By: Lupita Raider M.D.   On: 02/21/2019 08:17  Dg Abdomen Acute W/chest  Result Date: 02/20/2019 CLINICAL DATA:  Abdominal pain EXAM: DG ABDOMEN ACUTE W/ 1V CHEST COMPARISON:  None. FINDINGS: Moderate stool burden in the colon. The bowel gas pattern is normal. There is no evidence of free intraperitoneal air. No suspicious radio-opaque calculi or other significant radiographic abnormality is seen. Heart size and mediastinal contours are within normal limits. Both lungs are clear. IMPRESSION: Moderate stool burden.  No acute findings. Electronically Signed   By: Charlett NoseKevin   Dover M.D.   On: 02/20/2019 20:05      Indwelling Urinary Catheter continued, requirement due to   Reason to continue Indwelling Urinary Catheter for strict Intake/Output monitoring for hemodynamic instability   Central Line continued, requirement due to   Reason to continue [employer] Energy[employer] Line Monitoring of central venous pressure or other hemodynamic parameters   Ventilator continued, requirement due to, resp failure    Ventilator Sedation RASS 0 to -2     ASSESSMENT AND PLAN SYNOPSIS   Severe ACUTE Hypoxic and Hypercapnic Respiratory Failure s/p aspiration of blood/gastric contents from acute GIB with acute encephalopathy from acidosis and severe hemorrhagic shock -continue Full MV support -continue Bronchodilator Therapy -Wean Fio2 and PEEP as tolerated   NEUROLOGY - intubated and sedated - minimal sedation to achieve a RASS goal: -1   Hypovolumic shock  -use vasopressors to keep MAP>65 -follow ABG and LA -follow up cultures -aggressive IV fluid resuscitation Check CVP  CARDIAC ICU monitoring   ACUTE GIB GI PROPHYLAXIS as indicated DIET-->NPO for now Constipation protocol as indicated GI consultation pending OCTREOTIDE INFUSION PPI  ENDO - ICU hypoglycemic\Hyperglycemia protocol -check FSBS per protocol   ELECTROLYTES -follow labs as needed -replace as needed -pharmacy consultation and following   DVT/GI PRX ordered TRANSFUSIONS AS NEEDED MONITOR FSBS ASSESS the need for LABS as needed   Critical Care Time devoted to patient care services described in this note is 54  minutes.   Overall, patient is critically ill, prognosis is guarded.  Patient with Multiorgan failure and at high risk for cardiac arrest and death.    Lucie LeatherKurian David Davia Smyre, M.D.  [facility]  Medical Director [facility] Medical Director [facility] Cardio[facility] Department

## 2019-02-21 NOTE — Procedures (Signed)
  PROCEDURE: BRONCHOSCOPY Therapeutic Aspiration of Tracheobronchial Tree  PROCEDURE DATE: 02/21/2019  TIME:  NAME:  Bethany James  DOB:December 27, 1960  MRN: [id number]     Indications/Preliminary Diagnosis:   Consent: (Place X beside choice/s below)  The benefits, risks and possible complications of the procedure were        explained to:  ___ patient  ___ patient's family  _x__ other:____emergent_______  who verbalized understanding and gave:  ___ verbal  ___ written  ___ verbal and written  ___ telephone  ___ other:________ consent.      Unable to obtain consent; procedure performed on emergent basis.     Other:       PRESEDATION ASSESSMENT: History and Physical has been performed. Patient meds and allergies have been reviewed. Presedation airway examination has been performed and documented. Baseline vital signs, sedation score, oxygenation status, and cardiac rhythm were reviewed. Patient was deemed to be in satisfactory condition to undergo the procedure.         PROCEDURE DETAILS: Timeout performed and correct patient, name, & ID confirmed. Following prep per Pulmonary policy, appropriate sedation was administered. The Bronchoscope was inserted in to oral cavity with bite block in place. Therapeutic aspiration of Tracheobronchial tree was performed.  Airway exam proceeded with findings, technical procedures, and specimen collection as noted below. At the end of exam the scope was withdrawn without incident. Impression and Plan as noted below.           Airway Prep (Place X beside choice below)   1% Transtracheal Lidocaine Anesthetization 7 cc   Patient prepped per Bronchoscopy Lab Policy       Insertion Route (Place X beside choice below)   Nasal   Oral  X  Endotracheal Tube   Tracheostomy    TECHNICAL PROCEDURES: (Place X beside choice below)   Procedures  Description    None     Electrocautery     Cryotherapy     Balloon Dilatation     Bronchography    Stent Placement   x  Therapeutic Aspiration Extensive amounts of bloody mucus plugs extracted    Laser/Argon Plasma    Brachytherapy Catheter Placement    Foreign Body Removal         SPECIMENS (Sites): (Place X beside choice below)  Specimens Description   No Specimens Obtained     Washings    Lavage    Biopsies    Fine Needle Aspirates    Brushings    Sputum    FINDINGS: aspiration of gastric bloody contents  IMPRESSION:POST-PROCEDURE DX: aspiration pneumonitis from blood    RECOMMENDATION/PLAN: continue vent support     Lucie Leather, M.D.  [facility]  Medical Director [facility] Medical Director [facility] Cardio-Pulmonary Department

## 2019-02-21 NOTE — Op Note (Signed)
[facility] Patient Name: Bethany James Procedure Date: 02/21/2019 1:59 PM MRN: [id number] Account #: [id number] Date of Birth: 02/05/1961 Admit Type: Outpatient Age: 58 Room: [facility] ENDO ROOM 4 Gender: Female Note Status: Finalized Procedure:            Upper GI endoscopy Indications:          Acute post hemorrhagic anemia, Hematemesis, Melena Providers:            Boykin Nearing. Toledo MD, MD Complications:        No immediate complications. Procedure:            Pre-Anesthesia Assessment:                       - The risks and benefits of the procedure and the                        sedation options and risks were discussed with the                        patient. All questions were answered and informed                        consent was obtained.                       - Patient identification and proposed procedure were                        verified prior to the procedure by the nurse. The                        procedure was verified in the procedure room.                       - ASA Grade Assessment: IV - A patient with severe                        systemic disease that is a constant threat to life.                       - After reviewing the risks and benefits, the patient                        was deemed in satisfactory condition to undergo the                        procedure.                       - Sedation was administered by Critical care physician.                        Deep sedation was attained.                       After obtaining informed consent, the endoscope was                        passed under direct vision. Throughout the procedure,  the patient's blood pressure, pulse, and oxygen                        saturations were monitored continuously. The Endoscope                        was introduced through the mouth, and advanced to the                        fourth part of duodenum. The upper GI  endoscopy was                        somewhat difficult due to abnormal anatomy. Successful                        completion of the procedure was aided by withdrawing                        the scope and replacing with the therapeutic endoscope.                        The patient tolerated the procedure well. Findings:      The examined esophagus was normal.      There is no endoscopic evidence of bleeding, areas of erosion,       ulcerations or varices in the entire esophagus.      A medium amount of food (residue) was found in the cardia, in the       gastric fundus and in the prepyloric region of the stomach. Lavage of       the area was performed using a moderate amount of sterile water,       resulting in incomplete clearance with fair visualization. Estimated       blood loss was minimal.      Hematin (altered blood/coffee-ground-like material) was found in the       gastric fundus and in the gastric antrum.      Food (residue) was found in the first portion of the duodenum. Lavage of       the area was performed using a moderate amount of sterile water,       resulting in clearance with good visualization.      Three non-bleeding cratered duodenal ulcers with a visible vessel were       found in the second portion of the duodenum and in the third portion of       the duodenum. The largest lesion was 7 mm in largest dimension. Area was       successfully injected with 1 mL of a 1:10,000 solution of epinephrine       for hemostasis. Coagulation for bleeding prevention using bipolar probe       was successful. The interventions performed above included ONLY the       largest ulcer with a visible vessel. The other 2 ulcers in the in the       third and second portions of the duodenum did NOT require endoscopic       hemostasis. Estimated blood loss was minimal.      No actively bleeding was noted during the procedure Impression:           - Normal esophagus.                       -  A medium amount of food (residue) in the stomach.                       - Hematin (altered blood/coffee-ground-like material)                        in the gastric fundus and in the gastric antrum.                       - Retained food in the duodenum.                       - Non-bleeding duodenal ulcers with a visible vessel.                        Injected. Treated with bipolar cautery.                       - No specimens collected. Recommendation:       - NPO.                       - Continue present medications.                       - Repeat upper endoscopy PRN for retreatment.                       - Serum H Pylori IgG.                       - The findings and recommendations were discussed with                        the patient's family.                       - Discussed with Dr. Belia Heman, the attending ICU physician. 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 All rights reserved. The codes documented in this report are  preliminary and upon coder review may  be revised to meet current compliance requirements. Stanton Kidneyeodoro K Toledo MD, MD 02/21/2019 4:08:40 PM This report has been signed electronically. Number of Addenda: 0 Note Initiated On: 02/21/2019 1:59 PM      [facility]

## 2019-02-21 NOTE — Progress Notes (Signed)
CDS contacted due to GCS of 3, no cough, no gag, nonreactive pupils. Spoke with Bethany James, referral # [phone]. Dellia Cloud called back to get further information. Stated that due to pandemic they are not doing site visits as early, but that he would do a chart review and follow along via phone calls. Also stated that he would call back later tonight around [phone].

## 2019-02-21 NOTE — Progress Notes (Signed)
Inpatient Diabetes Program Recommendations  AACE/ADA: New Consensus Statement on Inpatient Glycemic Control (2015)  Target Ranges:  Prepandial:   less than 140 mg/dL      Peak postprandial:   less than 180 mg/dL (1-2 hours)      Critically ill patients:  140 - 180 mg/dL   Lab Results  Component Value Date   GLUCAP 127 (H) 02/21/2019    Diabetes history: DM  Outpatient Diabetes medications: Levemir 15 units q HS, Novolog 5 units tid with meals Current orders for Inpatient glycemic control:  IV insulin- DKA order set  Recommendations: Note events from today.  Patient remains on insulin drip for tx. Of DKA.  Agree with continuation of insulin drip until acidosis cleared and when clinically appropriate.  Will need basal insulin 2 hours prior to d/c of insulin drip. Thanks,   Beryl Meager, RN, BC-ADM Inpatient Diabetes Coordinator Pager [phone] (8a-5p)

## 2019-02-21 NOTE — Progress Notes (Signed)
Pharmacy Electrolyte Monitoring Consult:  Pharmacy consulted to assist in monitoring and replacing electrolytes in this 58 y.o. female admitted on 02/20/2019 with Hematemesis   Labs:  Sodium (mmol/L)  Date Value  02/21/2019 164 (HH)   Potassium (mmol/L)  Date Value  02/21/2019 2.6 (LL)   Magnesium (mg/dL)  Date Value  24/49/7530 1.4 (L)   Phosphorus (mg/dL)  Date Value  03/11/210 1.0 (LL)   Calcium (mg/dL)  Date Value  17/35/6701 6.8 (L)   Albumin (g/dL)  Date Value  41/12/129 4.5    Assessment/Plan: Patient received multiple sodium bicarb amps throughout the day. Sodium is elevated and CO2 is back in normal range. Per previous discussion with MD, will transition patient to LR. Will need to consider adding D5 to MIVF. Patient previously ordered insulin drip for DKA. Will defer to overnight team.   Patient has orders for magnesium 1g IV x 1. Recommend ordering additional magnesium 2g IV to replace for goal magnesium of ~ 2.   Patient currently receiving potassium Q1hr x 4 doses. Will order additional potassium phosphate IV x 1.   Will replace for goal potassium ~ 4, goal magnesium ~ 2, and goal phosphorus > 2.5.   Pharmacy will continue to monitor and adjust per consult.   Marlea Gambill L 02/21/2019 9:20 PM

## 2019-02-21 NOTE — Procedures (Signed)
Endotracheal Intubation: Patient required placement of an artificial airway secondary to Respiratory Failure  Consent: Emergent.   Hand washing performed prior to starting the procedure.   Medications administered for sedation prior to procedure:  Midazolam 4 mg IV,  Vecuronium 10 mg IV, Fentanyl 100 mcg IV.    A time out procedure was called and correct patient, name, & ID confirmed. Needed supplies and equipment were assembled and checked to include ETT, 10 ml syringe, Glidescope, Mac and Miller blades, suction, oxygen and bag mask valve, end tidal CO2 monitor.   Patient was positioned to align the mouth and pharynx to facilitate visualization of the glottis.   Heart rate, SpO2 and blood pressure was continuously monitored during the procedure. Pre-oxygenation was conducted prior to intubation and endotracheal tube was placed through the vocal cords into the trachea.     The artificial airway was placed under direct visualization via glidescope route using a 8.0 ETT on the first attempt.  ETT was secured at 23 cm mark.  Placement was confirmed by auscuitation of lungs with good breath sounds bilaterally and no stomach sounds.  Condensation was noted on endotracheal tube.   Pulse ox 98%.  CO2 detector in place with appropriate color change.   Complications: None .   Operator: Lacey Wallman.   Chest radiograph ordered and pending.   Comments: OGT placed via glidescope.  Johanny Segers David Julianah Marciel, M.D.  [facility]  Medical Director ICU-[facility] [place] Medical Director [facility] Cardio-Pulmonary Department       

## 2019-02-22 ENCOUNTER — Inpatient Hospital Stay: Payer: Medicaid Other

## 2019-02-22 LAB — BASIC METABOLIC PANEL
Anion gap: 7 (ref 5–15)
BUN: 56 mg/dL — ABNORMAL HIGH (ref 6–20)
CO2: 29 mmol/L (ref 22–32)
Calcium: 6.2 mg/dL — CL (ref 8.9–10.3)
Chloride: 119 mmol/L — ABNORMAL HIGH (ref 98–111)
Creatinine, Ser: 2.6 mg/dL — ABNORMAL HIGH (ref 0.44–1.00)
GFR calc Af Amer: 23 mL/min — ABNORMAL LOW (ref >60)
GFR calc non Af Amer: 20 mL/min — ABNORMAL LOW (ref >60)
Glucose, Bld: 78 mg/dL (ref 70–99)
Potassium: 3.3 mmol/L — ABNORMAL LOW (ref 3.5–5.1)
Sodium: 155 mmol/L — ABNORMAL HIGH (ref 135–145)

## 2019-02-22 LAB — HEMOGLOBIN AND HEMATOCRIT, BLOOD
HCT: 29.2 % — ABNORMAL LOW (ref 36.0–46.0)
HCT: 25.9 % — ABNORMAL LOW (ref 36.0–46.0)
Hemoglobin: 10.4 g/dL — ABNORMAL LOW (ref 12.0–15.0)
Hemoglobin: 9.5 g/dL — ABNORMAL LOW (ref 12.0–15.0)

## 2019-02-22 LAB — GLUCOSE, CAPILLARY
Glucose-Capillary: 158 mg/dL — ABNORMAL HIGH (ref 70–99)
Glucose-Capillary: 269 mg/dL — ABNORMAL HIGH (ref 70–99)
Glucose-Capillary: 237 mg/dL — ABNORMAL HIGH (ref 70–99)
Glucose-Capillary: 216 mg/dL — ABNORMAL HIGH (ref 70–99)
Glucose-Capillary: 239 mg/dL — ABNORMAL HIGH (ref 70–99)
Glucose-Capillary: 183 mg/dL — ABNORMAL HIGH (ref 70–99)
Glucose-Capillary: 184 mg/dL — ABNORMAL HIGH (ref 70–99)
Glucose-Capillary: 113 mg/dL — ABNORMAL HIGH (ref 70–99)
Glucose-Capillary: 93 mg/dL (ref 70–99)
Glucose-Capillary: 93 mg/dL (ref 70–99)
Glucose-Capillary: 60 mg/dL — ABNORMAL LOW (ref 70–99)
Glucose-Capillary: 99 mg/dL (ref 70–99)
Glucose-Capillary: 80 mg/dL (ref 70–99)
Glucose-Capillary: 93 mg/dL (ref 70–99)
Glucose-Capillary: 74 mg/dL (ref 70–99)
Glucose-Capillary: 80 mg/dL (ref 70–99)
Glucose-Capillary: 87 mg/dL (ref 70–99)

## 2019-02-22 LAB — CBC WITH DIFFERENTIAL/PLATELET
Abs Immature Granulocytes: 0.17 10*3/uL — ABNORMAL HIGH (ref 0.00–0.07)
Basophils Absolute: 0.1 10*3/uL (ref 0.0–0.1)
Basophils Relative: 0 %
Eosinophils Absolute: 0.3 10*3/uL (ref 0.0–0.5)
Eosinophils Relative: 1 %
HCT: 27.2 % — ABNORMAL LOW (ref 36.0–46.0)
Hemoglobin: 10 g/dL — ABNORMAL LOW (ref 12.0–15.0)
Immature Granulocytes: 1 %
Lymphocytes Relative: 14 %
Lymphs Abs: 3.3 10*3/uL (ref 0.7–4.0)
MCH: 29.9 pg (ref 26.0–34.0)
MCHC: 36.8 g/dL — ABNORMAL HIGH (ref 30.0–36.0)
MCV: 81.2 fL (ref 80.0–100.0)
Monocytes Absolute: 0.3 10*3/uL (ref 0.1–1.0)
Monocytes Relative: 1 %
Neutro Abs: 19.4 10*3/uL — ABNORMAL HIGH (ref 1.7–7.7)
Neutrophils Relative %: 83 %
Platelets: 146 10*3/uL — ABNORMAL LOW (ref 150–400)
RBC: 3.35 MIL/uL — ABNORMAL LOW (ref 3.87–5.11)
RDW: 14.1 % (ref 11.5–15.5)
Smear Review: NORMAL
WBC: 23.4 10*3/uL — ABNORMAL HIGH (ref 4.0–10.5)
nRBC: 0.4 % — ABNORMAL HIGH (ref 0.0–0.2)

## 2019-02-22 LAB — BLOOD GAS, ARTERIAL
Acid-Base Excess: 3.5 mmol/L — ABNORMAL HIGH (ref 0.0–2.0)
Bicarbonate: 29.1 mmol/L — ABNORMAL HIGH (ref 20.0–28.0)
FIO2: 0.6
MECHVT: 400 mL
O2 Saturation: 99.6 %
PEEP: 15 cmH2O
Patient temperature: 37
RATE: 18 resp/min
pCO2 arterial: 47 mmHg (ref 32.0–48.0)
pH, Arterial: 7.4 (ref 7.350–7.450)
pO2, Arterial: 178 mmHg — ABNORMAL HIGH (ref 83.0–108.0)

## 2019-02-22 LAB — URINE CULTURE: Culture: NO GROWTH

## 2019-02-22 LAB — COMPREHENSIVE METABOLIC PANEL
ALT: 43 U/L (ref 0–44)
AST: 93 U/L — ABNORMAL HIGH (ref 15–41)
Albumin: 1.9 g/dL — ABNORMAL LOW (ref 3.5–5.0)
Alkaline Phosphatase: 39 U/L (ref 38–126)
Anion gap: 12 (ref 5–15)
BUN: 56 mg/dL — ABNORMAL HIGH (ref 6–20)
CO2: 30 mmol/L (ref 22–32)
Calcium: 6.2 mg/dL — CL (ref 8.9–10.3)
Chloride: 116 mmol/L — ABNORMAL HIGH (ref 98–111)
Creatinine, Ser: 2.3 mg/dL — ABNORMAL HIGH (ref 0.44–1.00)
GFR calc Af Amer: 26 mL/min — ABNORMAL LOW (ref >60)
GFR calc non Af Amer: 23 mL/min — ABNORMAL LOW (ref >60)
Glucose, Bld: 340 mg/dL — ABNORMAL HIGH (ref 70–99)
Potassium: 3.7 mmol/L (ref 3.5–5.1)
Sodium: 158 mmol/L — ABNORMAL HIGH (ref 135–145)
Total Bilirubin: 1 mg/dL (ref 0.3–1.2)
Total Protein: 3.5 g/dL — ABNORMAL LOW (ref 6.5–8.1)

## 2019-02-22 LAB — TROPONIN I
Troponin I: 0.46 ng/mL (ref <0.03)
Troponin I: 0.42 ng/mL (ref <0.03)

## 2019-02-22 LAB — HIV ANTIBODY (ROUTINE TESTING W REFLEX): HIV Screen 4th Generation wRfx: NONREACTIVE

## 2019-02-22 LAB — HEMOGLOBIN A1C
Hgb A1c MFr Bld: 10.4 % — ABNORMAL HIGH (ref 4.8–5.6)
Mean Plasma Glucose: 252 mg/dL

## 2019-02-22 LAB — PHOSPHORUS: Phosphorus: 4.1 mg/dL (ref 2.5–4.6)

## 2019-02-22 LAB — MAGNESIUM: Magnesium: 1.4 mg/dL — ABNORMAL LOW (ref 1.7–2.4)

## 2019-02-22 MED ORDER — DEXTROSE 50 % IV SOLN
INTRAVENOUS | Status: AC
  Administered 2019-02-22: 16 mL via INTRAVENOUS
  Filled 2019-02-22: qty 50

## 2019-02-22 MED ORDER — MAGNESIUM SULFATE 4 GM/100ML IV SOLN
4.0000 g | Freq: Once | INTRAVENOUS | Status: AC
  Administered 2019-02-22: 4 g via INTRAVENOUS
  Filled 2019-02-22: qty 100

## 2019-02-22 MED ORDER — FOLIC ACID 5 MG/ML IJ SOLN
1.0000 mg | Freq: Every day | INTRAMUSCULAR | Status: DC
  Administered 2019-02-22: 1 mg via INTRAVENOUS
  Filled 2019-02-22 (×2): qty 0.2

## 2019-02-22 MED ORDER — INSULIN REGULAR(HUMAN) IN NACL 100-0.9 UT/100ML-% IV SOLN
INTRAVENOUS | Status: DC
  Administered 2019-02-22: 08:00:00 1.8 [IU]/h via INTRAVENOUS

## 2019-02-22 MED ORDER — DEXTROSE 5 % IV SOLN
INTRAVENOUS | Status: DC
  Administered 2019-02-22 (×2): via INTRAVENOUS

## 2019-02-22 MED ORDER — THIAMINE HCL 100 MG/ML IJ SOLN
100.0000 mg | Freq: Every day | INTRAMUSCULAR | Status: DC
  Administered 2019-02-22: 100 mg via INTRAVENOUS
  Filled 2019-02-22: qty 2

## 2019-02-22 MED ORDER — POTASSIUM CHLORIDE 10 MEQ/50ML IV SOLN
10.0000 meq | INTRAVENOUS | Status: AC
  Administered 2019-02-22 (×3): 10 meq via INTRAVENOUS
  Filled 2019-02-22 (×3): qty 50

## 2019-02-22 MED ORDER — DEXTROSE 50 % IV SOLN
16.0000 mL | Freq: Once | INTRAVENOUS | Status: AC
  Administered 2019-02-22: 16 mL via INTRAVENOUS

## 2019-02-22 MED ORDER — INSULIN ASPART 100 UNIT/ML ~~LOC~~ SOLN
0.0000 [IU] | SUBCUTANEOUS | Status: DC
  Administered 2019-02-22: 8 [IU] via SUBCUTANEOUS
  Administered 2019-02-22 (×2): 5 [IU] via SUBCUTANEOUS
  Filled 2019-02-22 (×3): qty 1

## 2019-02-22 NOTE — Progress Notes (Signed)
CRITICAL CARE NOTE  CC  follow up respiratory failure  SUBJECTIVE Patient remains critically ill Prognosis is guarded On multiple vasopressors Full vent support S/p transfusions HGB up to 10   SIGNIFICANT EVENTS 4/22 admitted for DKA nad severe hemmorhagic shock 4/22 intubated, aspiration of gastric contents and blood #8 ETT and LEFT FEM LINE PLACED 4/23 remains on vasopressors, multiorgan failure   REVIEW OF SYSTEMS  PATIENT IS UNABLE TO PROVIDE COMPLETE REVIEW OF SYSTEMS DUE TO SEVERE CRITICAL ILLNESS   PHYSICAL EXAMINATION:  GENERAL:critically ill appearing, +resp distress HEAD: Normocephalic, atraumatic.  EYES: Pupils equal, round, reactive to light.  No scleral icterus.  MOUTH: Moist mucosal membrane. NECK: Supple. No thyromegaly. No nodules. No JVD.  PULMONARY: +rhonchi, +wheezing CARDIOVASCULAR: S1 and S2. Regular rate and rhythm. No murmurs, rubs, or gallops.  GASTROINTESTINAL: Soft, nontender, -distended. No masses. Positive bowel sounds. No hepatosplenomegaly.  MUSCULOSKELETAL: No swelling, clubbing, or edema.  NEUROLOGIC: obtunded, GCS<8 SKIN:intact,warm,dry    CULTURE RESULTS   Recent Results (from the past 240 hour(s))  MRSA PCR Screening     Status: None   Collection Time: 02/20/19 10:07 PM  Result Value Ref Range Status   MRSA by PCR NEGATIVE NEGATIVE Final    Comment:        The GeneXpert MRSA Assay (FDA approved for NASAL specimens only), is one component of a comprehensive MRSA colonization surveillance program. It is not intended to diagnose MRSA infection nor to guide or monitor treatment for MRSA infections. Performed at [facility], [address] [place], [place] [address]   CULTURE, BLOOD (ROUTINE X 2) w Reflex to ID Panel     Status: None (Preliminary result)   Collection Time: 02/21/19  2:19 AM  Result Value Ref Range Status   Specimen Description BLOOD BLOOD LEFT FOREARM  Final   Special Requests   Final     BOTTLES DRAWN AEROBIC ONLY Blood Culture results may not be optimal due to an inadequate volume of blood received in culture bottles   Culture   Final    NO GROWTH 1 DAY Performed at [facility], [address] [place], [place] [address]    Report Status PENDING  Incomplete  CULTURE, BLOOD (ROUTINE X 2) w Reflex to ID Panel     Status: None (Preliminary result)   Collection Time: 02/21/19  2:19 AM  Result Value Ref Range Status   Specimen Description BLOOD BLOOD LEFT WRIST  Final   Special Requests   Final    BOTTLES DRAWN AEROBIC ONLY Blood Culture results may not be optimal due to an inadequate volume of blood received in culture bottles   Culture   Final    NO GROWTH 1 DAY Performed at [facility], [address] [place], [place] [address]    Report Status PENDING  Incomplete          IMAGING    Dg Abd 1 View  Result Date: 02/21/2019 CLINICAL DATA:  NG tube placement EXAM: ABDOMEN - 1 VIEW COMPARISON:  02/21/2019 FINDINGS: Esophageal tube tip is in the left upper quadrant. Airspace disease at the left base. Mild diffuse gaseous dilatation of the bowel IMPRESSION: Esophageal tube tip is in the left upper quadrant of the abdomen, projecting over the proximal stomach Electronically Signed   By: Jasmine PangKim  Fujinaga M.D.   On: 02/21/2019 22:38   Dg Abd 1 View  Result Date: 02/21/2019 CLINICAL DATA:  OG tube placement EXAM: ABDOMEN - 1 VIEW COMPARISON:  02/21/2019  FINDINGS: Esophageal tube is looped back upon itself over the distal esophagus. Diffuse mild air distention of the bowel. Infiltrate at the left base. IMPRESSION: Esophageal tube looped back upon itself over the distal esophagus. Electronically Signed   By: Jasmine Pang M.D.   On: 02/21/2019 22:37   Dg Abd 1 View  Result Date: 02/21/2019 CLINICAL DATA:  Enteric tube placement. EXAM: ABDOMEN - 1 VIEW COMPARISON:  Abdominal x-ray from yesterday. FINDINGS: Enteric tube looped in the stomach with the tip near  the gastric fundus. Left femoral approach central venous catheter with tip in the left common iliac vein. Paucity of bowel gas. No evidence of obstruction. Possible 3 mm right renal calculus. No acute osseous abnormality. IMPRESSION: 1. Enteric tube looped in the stomach. 2. Left femoral approach central venous catheter tip in the left common iliac vein. 3. No acute findings. Electronically Signed   By: Obie Dredge M.D.   On: 02/21/2019 14:25   Dg Chest Port 1 View  Result Date: 02/22/2019 CLINICAL DATA:  58 year old female with respiratory failure. Aspiration of bloody gastric contents. EXAM: PORTABLE CHEST 1 VIEW COMPARISON:  420 through 20 and earlier. FINDINGS: Stable endotracheal tube tip in good position. Enteric tube courses to the midline, tip not included. Mediastinal contours remain normal. Mildly lower lung volumes. Confluent bilateral perihilar and lower lung airspace opacity with retrocardiac air bronchograms. Increased left lower lobe consolidation since yesterday with some obscuration of the left hemidiaphragm. No superimposed pneumothorax. No pleural effusion is evident. Upper lung pulmonary vascularity appears normal. IMPRESSION: 1. Stable lines and tubes. 2. Confluent bilateral perihilar and lower lung pneumonia with increased left lower lobe consolidation since yesterday. Electronically Signed   By: Odessa Fleming M.D.   On: 02/22/2019 05:51   Dg Chest Port 1 View  Result Date: 02/21/2019 CLINICAL DATA:  Hematemesis. EXAM: PORTABLE CHEST 1 VIEW COMPARISON:  Chest x-ray from same day at 7:16. FINDINGS: Interval placement of an endotracheal tube with the tip 2.0 cm above the carina. Enteric tube coiled within the stomach. The heart size and mediastinal contours are within normal limits. Normal pulmonary vascularity. New patchy perihilar and bilateral lower lobe consolidations. No pleural effusion or pneumothorax. No acute osseous abnormality. IMPRESSION: 1. Appropriately positioned endotracheal  and enteric tubes. 2. New patchy perihilar and bilateral lower lobe consolidations, concerning for aspiration. Electronically Signed   By: Obie Dredge M.D.   On: 02/21/2019 14:22      Indwelling Urinary Catheter continued, requirement due to   Reason to continue Indwelling Urinary Catheter for strict Intake/Output monitoring for hemodynamic instability   Central Line continued, requirement due to   Reason to continue [employer] Monitoring of central venous pressure or other hemodynamic parameters   Ventilator continued, requirement due to, resp failure    Ventilator Sedation RASS 0 to -2     ASSESSMENT AND PLAN SYNOPSIS  Severe ACUTE Hypoxic and Hypercapnic Respiratory Failure s/p aspiration of blood/gastric contents from acute GIB with acute encephalopathy from acidosis and severe hemorrhagic shock  -continue Full MV support -continue Bronchodilator Therapy -Wean Fio2 and PEEP as tolerated    Renal Failure- -follow chem 7 -follow UO -continue Foley Catheter-assess need daily   NEUROLOGY - intubated and sedated - minimal sedation to achieve a RASS goal: -1 Wake up assessment pending   Hypovolumic shock  -use vasopressors to keep MAP>65 Wean as tolerated -follow ABG and LA -follow up cultures -emperic ABX -stress dose steroids -aggressive IV fluid resuscitation  CARDIAC ICU monitoring  ID -continue IV abx as prescibed -follow up cultures   ACUTE GIB DIET-->NPO OCTREOTIDE AND PROTONIX INFUSIONS Follow Gi RECS   ACUTE ANEMIA- TRANSFUSE AS NEEDED DVT PRX with TED/SCD's ONLY   ENDO - ICU hypoglycemic\Hyperglycemia protocol -check FSBS per protocol   ELECTROLYTES -follow labs as needed -replace as needed -pharmacy consultation and following   DVT/GI PRX ordered TRANSFUSIONS AS NEEDED MONITOR FSBS ASSESS the need for LABS as needed   Critical Care Time devoted to patient care services described in this note is 45 minutes.    Overall, patient is critically ill, prognosis is guarded.  Patient with Multiorgan failure and at high risk for cardiac arrest and death.    Lucie Leather, M.D.  [facility]  Medical Director [facility] Medical Director [facility] Cardio-Pulmonary Department

## 2019-02-22 NOTE — Progress Notes (Signed)
Initial Nutrition Assessment  RD working remotely.  DOCUMENTATION CODES:   Not applicable  INTERVENTION:  Patient too unstable for enteral nutrition at this time.  Once appropriate for enteral nutrition, initiate Vital AF 1.2 at trickle rate of 20 mL/hr.  If patient tolerates trickle rate for approximately 24 hours, can advance to goal regimen of Vital AF 1.2 at 40 mL/hr (960 mL goal daily volume). Provides 1152 kcal, 72 grams of protein, 778 mL H2O daily.  If tube feeds are initiated recommend a minimum free water flush of 30 mL Q4hrs to maintain tube patency, and liquid MVI daily per tube to meet 100% RDIs for vitamins/minerals.  Monitor magnesium, potassium, and phosphorus daily for at least 3 days, MD to replete as needed, as pt is at risk for refeeding syndrome given significant weight loss according to weights available in chart.  NUTRITION DIAGNOSIS:   Inadequate oral intake related to inability to eat as evidenced by NPO status.  GOAL:   Provide needs based on ASPEN/SCCM guidelines  MONITOR:   Vent status, Labs, Weight trends, TF tolerance, I & O's  REASON FOR ASSESSMENT:   Malnutrition Screening Tool, Ventilator    ASSESSMENT:   58 year old female with PMHx of DM admitted with DKA, severe hemorrhagic shock, required intubation on 4/22, aspiration PNA, upper GI bleed (duodenal ulcers found on EGD 4/22).   Patient intubated and sedated. On PRVC mode with FiO2 60% and PEEP 15 cmH2O. Abdomen soft per RN documentation. Patient having type 7 BMs per rectal tube. Patient screens positive for MST in setting of reported decreased appetite/intake and weight loss. Per chart patient was 71.2 kg on 04/06/2018, 59 kg on 08/13/2018, and is currently 48.6 kg (107.14 lbs). That is a weight loss of 22.6 kg (31.7% body weight) over 11 months, which is significant for time frame if weight history is accurate.  Enteral Access: 16 Fr. OGT placed 4/22; terminates in proximal stomach per  abdominal x-ray 4/22 (originally was looped in stomach but was then pulled back); 55 cm at corner of mouth  MAP: 67-91 mmHg  Patient is currently intubated on ventilator support Ve: 7.2 L/min Temp (24hrs), Avg:97.6 F (36.4 C), Min:96.3 F (35.7 C), Max:99 F (37.2 C)  Propofol: N/A  Medications reviewed and include: folic acid 1 mg daily IV, pantoprazole, thiamine 100 mg daily IV, ceftriaxone, D5W at 75 mL/hr (90 grams dextrose, 306 kcal daily), fentanyl gtt, regular insulin gtt, Flagyl, norepinephrine gtt at 40 mcg/min, octreotide gtt, pantoprazole, phenylephrine gtt at 5 mcg/min. Patient received blood transfusions yesterday.   Labs reviewed: CBG 183-269, Sodium 158, Chloride 116, BUN 56, Creatinine 2.3, Calcium 6.2 (corrects to 7.88 with albumin 1.9), Magnesium 1.4, Phosphorus 4.1.  I/O: 668 mL UOP yesterday (0.6 mL/kg/hr); 700 mL output of emesis yesterday  Patient is at risk for malnutrition.  NUTRITION - FOCUSED PHYSICAL EXAM:  Unable to complete at this time.  Diet Order:   Diet Order            Diet NPO time specified  Diet effective now             EDUCATION NEEDS:   No education needs have been identified at this time  Skin:  Skin Assessment: Reviewed RN Assessment(ecchymosis)  Last BM:  02/22/2019 - small type 7 per rectal tube placed on 4/22  Height:   Ht Readings from Last 1 Encounters:  02/20/19 4\' 9"  (1.448 m)   Weight:   Wt Readings from Last 1 Encounters:  02/20/19 48.6 kg   Ideal Body Weight:  39.2 kg  BMI:  Body mass index is 23.19 kg/m.  Estimated Nutritional Needs:   Kcal:  1135 (PSU 2003b w/ MSJ 949, Ve 7.2, Tmax 37.2); 1215 (25 kcal/kg)  Protein:  70-85 grams (1.5-1.8 grams/kg)  Fluid:  1.2-1.5 L/day  Helane RimaLeanne Safira Proffit, MS, RD, LDN Office: [phone] Pager: [phone] After Hours/Weekend Pager: [phone]

## 2019-02-22 NOTE — Progress Notes (Signed)
[facility] Gastroenterology Inpatient Progress Note  Subjective: Patient seen for f/u UGI bleed. Per RN, patient still having some dark stool output. Patient still on pressor levophed, but it appears that neo is on hold suggesting some possible improvement. Responding to some vocal stimuli.  Objective: Vital signs in last 24 hours: Temp:  [96.3 F (35.7 C)-99 F (37.2 C)] 97.4 F (36.3 C) (04/23 1230) Pulse Rate:  [25-135] 90 (04/23 1200) Resp:  [12-32] 18 (04/23 1200) BP: (45-158)/(12-121) 95/67 (04/23 1200) SpO2:  [61 %-100 %] 100 % (04/23 1200) FiO2 (%):  [60 %-100 %] 60 % (04/23 1230) Blood pressure 95/67, pulse 90, temperature (!) 97.4 F (36.3 C), temperature source Axillary, resp. rate 18, height _0  (1.448 m), weight 48.6 kg, SpO2 100 %.    Intake/Output from previous day: 04/22 0701 - 04/23 0700 In: 9590.4 [I.V.:5947.3; Blood:772.5; IV MEBRAXENM:0768.0] Out: 8811 [Urine:668; Emesis/NG output:700; Stool:150]  Intake/Output this shift: Total I/O In: 1678.4 [I.V.:1367.8; IV Piggyback:310.6] Out: 75 [Urine:75]   General appearance:  Intubated, arousable. Still appears ill. Resp:  Coarse rhonchi bilaterallly. Cardio:  Slight tachy, no gallop GI: Soft, no masses. BS hypoactive. Extremities:  1+ edema.    Lab Results: Results for orders placed or performed during the hospital encounter of 02/20/19 (from the past 24 hour(s))  Basic metabolic panel     Status: Abnormal   Collection Time: 02/21/19  1:17 PM  Result Value Ref Range   Sodium 165 (HH) 135 - 145 mmol/L   Potassium 2.9 (L) 3.5 - 5.1 mmol/L   Chloride 127 (H) 98 - 111 mmol/L   CO2 29 22 - 32 mmol/L   Glucose, Bld 214 (H) 70 - 99 mg/dL   BUN 54 (H) 6 - 20 mg/dL   Creatinine, Ser 1.41 (H) 0.44 - 1.00 mg/dL   Calcium 7.2 (L) 8.9 - 10.3 mg/dL   GFR calc non Af Amer 41 (L) >60 mL/min   GFR calc Af Amer 48 (L) >60 mL/min   Anion gap 9 5 - 15  ABO/Rh     Status: None   Collection Time: 02/21/19  1:17 PM   Result Value Ref Range   ABO/RH(D)      O POS Performed at [facility], [address] [place], [place] [address]   Blood gas, arterial     Status: Abnormal   Collection Time: 02/21/19  1:40 PM  Result Value Ref Range   FIO2 1.00    Delivery systems VENTILATOR    pH, Arterial 7.51 (H) 7.350 - 7.450   pCO2 arterial 60 (H) 32.0 - 48.0 mmHg   pO2, Arterial 453 (H) 83.0 - 108.0 mmHg   Bicarbonate 47.9 (H) 20.0 - 28.0 mmol/L   Acid-Base Excess 21.1 (H) 0.0 - 2.0 mmol/L   O2 Saturation 100.0 %   Patient temperature 37.0    Collection site RIGHT RADIAL    Sample type ARTERIAL DRAW    Allens test (pass/fail) PASS PASS  Glucose, capillary     Status: Abnormal   Collection Time: 02/21/19  1:45 PM  Result Value Ref Range   Glucose-Capillary 150 (H) 70 - 99 mg/dL  Prepare RBC     Status: None   Collection Time: 02/21/19  1:49 PM  Result Value Ref Range   Order Confirmation      ORDER PROCESSED BY BLOOD BANK Performed at [facility], [address] [place], [place] [address]   Glucose, capillary     Status: Abnormal  Collection Time: 02/21/19  4:18 PM  Result Value Ref Range   Glucose-Capillary 127 (H) 70 - 99 mg/dL  Basic metabolic panel     Status: Abnormal   Collection Time: 02/21/19  5:44 PM  Result Value Ref Range   Sodium 164 (HH) 135 - 145 mmol/L   Potassium 2.6 (LL) 3.5 - 5.1 mmol/L   Chloride 120 (H) 98 - 111 mmol/L   CO2 28 22 - 32 mmol/L   Glucose, Bld 195 (H) 70 - 99 mg/dL   BUN 52 (H) 6 - 20 mg/dL   Creatinine, Ser 1.93 (H) 0.44 - 1.00 mg/dL   Calcium 6.8 (L) 8.9 - 10.3 mg/dL   GFR calc non Af Amer 28 (L) >60 mL/min   GFR calc Af Amer 33 (L) >60 mL/min   Anion gap 16 (H) 5 - 15  Magnesium     Status: Abnormal   Collection Time: 02/21/19  5:44 PM  Result Value Ref Range   Magnesium 1.4 (L) 1.7 - 2.4 mg/dL  Phosphorus     Status: Abnormal   Collection Time: 02/21/19  5:44 PM  Result Value Ref Range   Phosphorus 1.0 (LL) 2.5 - 4.6 mg/dL   Blood gas, arterial     Status: Abnormal (Preliminary result)   Collection Time: 02/21/19  6:48 PM  Result Value Ref Range   FIO2 0.70    Delivery systems VENTILATOR    Mode PRESSURE REGULATED VOLUME CONTROL    VT 400 mL   LHR 18 resp/min   Peep/cpap 15.0 cm H20   pH, Arterial 7.39 7.350 - 7.450   pCO2 arterial 48 32.0 - 48.0 mmHg   pO2, Arterial 173 (H) 83.0 - 108.0 mmHg   Bicarbonate 29.1 (H) 20.0 - 28.0 mmol/L   Acid-Base Excess 3.3 (H) 0.0 - 2.0 mmol/L   O2 Saturation 99.5 %   Patient temperature 37.0    Collection site A-LINE    Sample type ARTERIAL DRAW    Allens test (pass/fail) PASS PASS   Mechanical Rate PENDING   Glucose, capillary     Status: Abnormal   Collection Time: 02/21/19  7:34 PM  Result Value Ref Range   Glucose-Capillary 132 (H) 70 - 99 mg/dL  Glucose, capillary     Status: Abnormal   Collection Time: 02/21/19  9:05 PM  Result Value Ref Range   Glucose-Capillary 158 (H) 70 - 99 mg/dL  Troponin I - Now Then Q6H     Status: Abnormal   Collection Time: 02/21/19  9:56 PM  Result Value Ref Range   Troponin I 0.26 (HH) <0.03 ng/mL  CBC     Status: Abnormal   Collection Time: 02/21/19  9:56 PM  Result Value Ref Range   WBC 15.3 (H) 4.0 - 10.5 K/uL   RBC 3.47 (L) 3.87 - 5.11 MIL/uL   Hemoglobin 10.3 (L) 12.0 - 15.0 g/dL   HCT 28.6 (L) 36.0 - 46.0 %   MCV 82.4 80.0 - 100.0 fL   MCH 29.7 26.0 - 34.0 pg   MCHC 36.0 30.0 - 36.0 g/dL   RDW 13.1 11.5 - 15.5 %   Platelets 144 (L) 150 - 400 K/uL   nRBC 0.1 0.0 - 0.2 %  Glucose, capillary     Status: Abnormal   Collection Time: 02/21/19 11:34 PM  Result Value Ref Range   Glucose-Capillary 241 (H) 70 - 99 mg/dL  Glucose, capillary     Status: Abnormal   Collection Time: 02/22/19  4:04 AM  Result Value Ref Range   Glucose-Capillary 269 (H) 70 - 99 mg/dL  Hemoglobin and hematocrit, blood     Status: Abnormal   Collection Time: 02/22/19  4:24 AM  Result Value Ref Range   Hemoglobin 10.4 (L) 12.0 - 15.0 g/dL    HCT 29.2 (L) 36.0 - 46.0 %  Comprehensive metabolic panel     Status: Abnormal   Collection Time: 02/22/19  4:24 AM  Result Value Ref Range   Sodium 158 (H) 135 - 145 mmol/L   Potassium 3.7 3.5 - 5.1 mmol/L   Chloride 116 (H) 98 - 111 mmol/L   CO2 30 22 - 32 mmol/L   Glucose, Bld 340 (H) 70 - 99 mg/dL   BUN 56 (H) 6 - 20 mg/dL   Creatinine, Ser 2.30 (H) 0.44 - 1.00 mg/dL   Calcium 6.2 (LL) 8.9 - 10.3 mg/dL   Total Protein 3.5 (L) 6.5 - 8.1 g/dL   Albumin 1.9 (L) 3.5 - 5.0 g/dL   AST 93 (H) 15 - 41 U/L   ALT 43 0 - 44 U/L   Alkaline Phosphatase 39 38 - 126 U/L   Total Bilirubin 1.0 0.3 - 1.2 mg/dL   GFR calc non Af Amer 23 (L) >60 mL/min   GFR calc Af Amer 26 (L) >60 mL/min   Anion gap 12 5 - 15  Troponin I - Now Then Q6H     Status: Abnormal   Collection Time: 02/22/19  4:24 AM  Result Value Ref Range   Troponin I 0.46 (HH) <0.03 ng/mL  Magnesium     Status: Abnormal   Collection Time: 02/22/19  4:24 AM  Result Value Ref Range   Magnesium 1.4 (L) 1.7 - 2.4 mg/dL  Blood gas, arterial     Status: Abnormal   Collection Time: 02/22/19  5:00 AM  Result Value Ref Range   FIO2 0.60    Delivery systems VENTILATOR    Mode PRESSURE REGULATED VOLUME CONTROL    VT 400 mL   LHR 18 resp/min   Peep/cpap 15.0 cm H20   pH, Arterial 7.40 7.350 - 7.450   pCO2 arterial 47 32.0 - 48.0 mmHg   pO2, Arterial 178 (H) 83.0 - 108.0 mmHg   Bicarbonate 29.1 (H) 20.0 - 28.0 mmol/L   Acid-Base Excess 3.5 (H) 0.0 - 2.0 mmol/L   O2 Saturation 99.6 %   Patient temperature 37.0    Collection site A-LINE    Sample type ARTERIAL DRAW   Phosphorus     Status: None   Collection Time: 02/22/19  6:00 AM  Result Value Ref Range   Phosphorus 4.1 2.5 - 4.6 mg/dL  Glucose, capillary     Status: Abnormal   Collection Time: 02/22/19  7:20 AM  Result Value Ref Range   Glucose-Capillary 237 (H) 70 - 99 mg/dL  Glucose, capillary     Status: Abnormal   Collection Time: 02/22/19  9:01 AM  Result Value Ref  Range   Glucose-Capillary 216 (H) 70 - 99 mg/dL  Troponin I - Once     Status: Abnormal   Collection Time: 02/22/19 10:03 AM  Result Value Ref Range   Troponin I 0.42 (HH) <0.03 ng/mL  CBC with Differential/Platelet     Status: Abnormal   Collection Time: 02/22/19 10:03 AM  Result Value Ref Range   WBC 23.4 (H) 4.0 - 10.5 K/uL   RBC 3.35 (L) 3.87 - 5.11 MIL/uL   Hemoglobin 10.0 (L) 12.0 - 15.0 g/dL  HCT 27.2 (L) 36.0 - 46.0 %   MCV 81.2 80.0 - 100.0 fL   MCH 29.9 26.0 - 34.0 pg   MCHC 36.8 (H) 30.0 - 36.0 g/dL   RDW 14.1 11.5 - 15.5 %   Platelets 146 (L) 150 - 400 K/uL   nRBC 0.4 (H) 0.0 - 0.2 %   Neutrophils Relative % 83 %   Neutro Abs 19.4 (H) 1.7 - 7.7 K/uL   Lymphocytes Relative 14 %   Lymphs Abs 3.3 0.7 - 4.0 K/uL   Monocytes Relative 1 %   Monocytes Absolute 0.3 0.1 - 1.0 K/uL   Eosinophils Relative 1 %   Eosinophils Absolute 0.3 0.0 - 0.5 K/uL   Basophils Relative 0 %   Basophils Absolute 0.1 0.0 - 0.1 K/uL   WBC Morphology MORPHOLOGY UNREMARKABLE    RBC Morphology MORPHOLOGY UNREMARKABLE    Smear Review Normal platelet morphology    Immature Granulocytes 1 %   Abs Immature Granulocytes 0.17 (H) 0.00 - 0.07 K/uL  Glucose, capillary     Status: Abnormal   Collection Time: 02/22/19 10:07 AM  Result Value Ref Range   Glucose-Capillary 239 (H) 70 - 99 mg/dL  Glucose, capillary     Status: Abnormal   Collection Time: 02/22/19 11:09 AM  Result Value Ref Range   Glucose-Capillary 183 (H) 70 - 99 mg/dL  Glucose, capillary     Status: Abnormal   Collection Time: 02/22/19 12:13 PM  Result Value Ref Range   Glucose-Capillary 184 (H) 70 - 99 mg/dL     Recent Labs    02/21/19 0551  02/21/19 2156 02/22/19 0424 02/22/19 1003  WBC 21.6*  --  15.3*  --  23.4*  HGB 8.5*   < > 10.3* 10.4* 10.0*  HCT 26.4*   < > 28.6* 29.2* 27.2*  PLT 298  --  144*  --  146*   < > = values in this interval not displayed.   BMET Recent Labs    02/21/19 1317 02/21/19 1744  02/22/19 0424  NA 165* 164* 158*  K 2.9* 2.6* 3.7  CL 127* 120* 116*  CO2 _0 GLUCOSE 214* 195* 340*  BUN 54* 52* 56*  CREATININE 1.41* 1.93* 2.30*  CALCIUM 7.2* 6.8* 6.2*   LFT Recent Labs    02/22/19 0424  PROT 3.5*  ALBUMIN 1.9*  AST 93*  ALT 43  ALKPHOS 39  BILITOT 1.0   PT/INR Recent Labs    02/21/19 0713  LABPROT 17.7*  INR 1.5*   Hepatitis Panel No results for input(s): HEPBSAG, HCVAB, HEPAIGM, HEPBIGM in the last 72 hours. C-Diff No results for input(s): CDIFFTOX in the last 72 hours. No results for input(s): CDIFFPCR in the last 72 hours.   Studies/Results: Dg Abd 1 View  Result Date: 02/21/2019 CLINICAL DATA:  NG tube placement EXAM: ABDOMEN - 1 VIEW COMPARISON:  02/21/2019 FINDINGS: Esophageal tube tip is in the left upper quadrant. Airspace disease at the left base. Mild diffuse gaseous dilatation of the bowel IMPRESSION: Esophageal tube tip is in the left upper quadrant of the abdomen, projecting over the proximal stomach Electronically Signed   By: Donavan Foil M.D.   On: 02/21/2019 22:38   Dg Abd 1 View  Result Date: 02/21/2019 CLINICAL DATA:  OG tube placement EXAM: ABDOMEN - 1 VIEW COMPARISON:  02/21/2019 FINDINGS: Esophageal tube is looped back upon itself over the distal esophagus. Diffuse mild air distention of the bowel. Infiltrate at the left base.  IMPRESSION: Esophageal tube looped back upon itself over the distal esophagus. Electronically Signed   By: Donavan Foil M.D.   On: 02/21/2019 22:37   Dg Abd 1 View  Result Date: 02/21/2019 CLINICAL DATA:  Enteric tube placement. EXAM: ABDOMEN - 1 VIEW COMPARISON:  Abdominal x-ray from yesterday. FINDINGS: Enteric tube looped in the stomach with the tip near the gastric fundus. Left femoral approach central venous catheter with tip in the left common iliac vein. Paucity of bowel gas. No evidence of obstruction. Possible 3 mm right renal calculus. No acute osseous abnormality. IMPRESSION: 1.  Enteric tube looped in the stomach. 2. Left femoral approach central venous catheter tip in the left common iliac vein. 3. No acute findings. Electronically Signed   By: Titus Dubin M.D.   On: 02/21/2019 14:25   Dg Chest Port 1 View  Result Date: 02/22/2019 CLINICAL DATA:  58 year old female with respiratory failure. Aspiration of bloody gastric contents. EXAM: PORTABLE CHEST 1 VIEW COMPARISON:  420 through 20 and earlier. FINDINGS: Stable endotracheal tube tip in good position. Enteric tube courses to the midline, tip not included. Mediastinal contours remain normal. Mildly lower lung volumes. Confluent bilateral perihilar and lower lung airspace opacity with retrocardiac air bronchograms. Increased left lower lobe consolidation since yesterday with some obscuration of the left hemidiaphragm. No superimposed pneumothorax. No pleural effusion is evident. Upper lung pulmonary vascularity appears normal. IMPRESSION: 1. Stable lines and tubes. 2. Confluent bilateral perihilar and lower lung pneumonia with increased left lower lobe consolidation since yesterday. Electronically Signed   By: Genevie Ann M.D.   On: 02/22/2019 05:51   Dg Chest Port 1 View  Result Date: 02/21/2019 CLINICAL DATA:  Hematemesis. EXAM: PORTABLE CHEST 1 VIEW COMPARISON:  Chest x-ray from same day at 7:16. FINDINGS: Interval placement of an endotracheal tube with the tip 2.0 cm above the carina. Enteric tube coiled within the stomach. The heart size and mediastinal contours are within normal limits. Normal pulmonary vascularity. New patchy perihilar and bilateral lower lobe consolidations. No pleural effusion or pneumothorax. No acute osseous abnormality. IMPRESSION: 1. Appropriately positioned endotracheal and enteric tubes. 2. New patchy perihilar and bilateral lower lobe consolidations, concerning for aspiration. Electronically Signed   By: Titus Dubin M.D.   On: 02/21/2019 14:22   Dg Chest Port 1 View  Result Date:  02/21/2019 CLINICAL DATA:  Leukocytosis. EXAM: PORTABLE CHEST 1 VIEW COMPARISON:  Radiographs of February 20, 2019. FINDINGS: The heart size and mediastinal contours are within normal limits. Both lungs are clear. The visualized skeletal structures are unremarkable. IMPRESSION: No active disease. Electronically Signed   By: Marijo Conception M.D.   On: 02/21/2019 08:17   Dg Abdomen Acute W/chest  Result Date: 02/20/2019 CLINICAL DATA:  Abdominal pain EXAM: DG ABDOMEN ACUTE W/ 1V CHEST COMPARISON:  None. FINDINGS: Moderate stool burden in the colon. The bowel gas pattern is normal. There is no evidence of free intraperitoneal air. No suspicious radio-opaque calculi or other significant radiographic abnormality is seen. Heart size and mediastinal contours are within normal limits. Both lungs are clear. IMPRESSION: Moderate stool burden.  No acute findings. Electronically Signed   By: Rolm Baptise M.D.   On: 02/20/2019 20:05    Scheduled Inpatient Medications:   . chlorhexidine gluconate (MEDLINE KIT)  15 mL Mouth Rinse BID  . fentaNYL (SUBLIMAZE) injection  50 mcg Intravenous Once  . folic acid  1 mg Intravenous Daily  . hydrocortisone sod succinate (SOLU-CORTEF) inj  50 mg Intravenous Q6H  .  mouth rinse  15 mL Mouth Rinse 10 times per day  . [START ON 02/24/2019] pantoprazole  40 mg Intravenous Q12H  . thiamine injection  100 mg Intravenous Daily    Continuous Inpatient Infusions:   . sodium chloride Stopped (02/22/19 1223)  . cefTRIAXone (ROCEPHIN)  IV Stopped (02/22/19 1037)  . dextrose 75 mL/hr at 02/22/19 1230  . fentaNYL infusion INTRAVENOUS 50 mcg/hr (02/22/19 1230)  . insulin 6.2 mL/hr at 02/22/19 1230  . metronidazole 100 mL/hr at 02/22/19 1230  . norepinephrine (LEVOPHED) Adult infusion 40 mcg/min (02/22/19 1230)  . octreotide  (SANDOSTATIN)    IV infusion 50 mcg/hr (02/22/19 1230)  . pantoprozole (PROTONIX) infusion 8 mg/hr (02/22/19 1230)  . phenylephrine (NEO-SYNEPHRINE) Adult  infusion 5 mcg/min (02/22/19 1230)    PRN Inpatient Medications:  sodium chloride, fentaNYL, midazolam    Assessment:  1. UGI bleed - presumably from duodenal ulcers. Appears stable at present. 2. Aspiration pneumonia with respiratory failure. 3. DKA  4. Hypovolemic shock - still on pressor, marginally improving.  Plan:  1. Continue IV protonix gtt, agree with sandostatin also. 2. Serial H/H. 3. Rescope for any overt rebleeding. 4. Following closely.   Teodoro K. Alice Reichert, M.D. 02/22/2019, 12:43 PM

## 2019-02-22 NOTE — Progress Notes (Signed)
[place] at [facility]                                                                                                                                                                                  Patient Demographics   Bethany James, is a 58 y.o. female, DOB - 04/29/1961, XTG:[id number]  Admit date - 02/20/2019   Admitting Physician Lance Coon, MD  Outpatient Primary MD for the patient is System, Pcp Not In   LOS - 2  Subjective: Yesterday's events noted, patient had a large emesis subsequently requiring intubation had a EGD patient currently on pressors and intubated  Review of Systems:   CONSTITUTIONAL: Intubated Vitals:   Vitals:   02/22/19 0753 02/22/19 0800 02/22/19 0815 02/22/19 0830  BP: 98/63 99/69 100/67 99/72  Pulse: 91 87 86 90  Resp: 18 18 18 18   Temp:      TempSrc:      SpO2: 100% 100% 100% 100%  Weight:      Height:        Wt Readings from Last 3 Encounters:  02/20/19 48.6 kg  08/13/18 59 kg  04/06/18 71.2 kg     Intake/Output Summary (Last 24 hours) at 02/22/2019 1026 Last data filed at 02/22/2019 0752 Gross per 24 hour  Intake 8467.43 ml  Output 1543 ml  Net 6924.43 ml    Physical Exam:   GENERAL: Critically ill-appearing.  Intubated HEAD, EYES, EARS, NOSE AND THROAT: Atraumatic, normocephalic. . Pupils equal and reactive to light. Sclerae anicteric. No conjunctival injection. No oro-pharyngeal erythema.  NECK: Supple. There is no jugular venous distention. No bruits, no lymphadenopathy, no thyromegaly.  HEART: Regular rate and rhythm,. No murmurs, no rubs, no clicks.  LUNGS: Clear to auscultation bilaterally. No rales or rhonchi. No wheezes.  ABDOMEN: Soft, flat, nontender, nondistended. Has good bowel sounds. No hepatosplenomegaly appreciated.  EXTREMITIES: No evidence of any cyanosis, clubbing, or peripheral edema.  +2 pedal and radial pulses bilaterally.  NEUROLOGIC: Sedated SKIN: Moist and warm with no rashes  appreciated.  Psych: Not anxious, depressed LN: No inguinal LN enlargement    Antibiotics   Anti-infectives (From admission, onward)   Start     Dose/Rate Route Frequency Ordered Stop   02/21/19 1930  metroNIDAZOLE (FLAGYL) IVPB 500 mg     500 mg 100 mL/hr over 60 Minutes Intravenous Every 6 hours 02/21/19 1923     02/21/19 1400  cefTRIAXone (ROCEPHIN) 2 g in sodium chloride 0.9 % 100 mL IVPB     2 g 200 mL/hr over 30 Minutes Intravenous Daily 02/21/19 1324 02/28/19 0959   02/21/19 1400  erythromycin 250 mg in sodium chloride 0.9 %  100 mL IVPB     250 mg 100 mL/hr over 60 Minutes Intravenous STAT 02/21/19 1335 02/21/19 1538   02/21/19 0145  ceFEPIme (MAXIPIME) 2 g in sodium chloride 0.9 % 100 mL IVPB  Status:  Discontinued     2 g 200 mL/hr over 30 Minutes Intravenous Every 24 hours 02/21/19 0141 02/21/19 0652      Medications   Scheduled Meds: . chlorhexidine gluconate (MEDLINE KIT)  15 mL Mouth Rinse BID  . fentaNYL (SUBLIMAZE) injection  50 mcg Intravenous Once  . hydrocortisone sod succinate (SOLU-CORTEF) inj  50 mg Intravenous Q6H  . mouth rinse  15 mL Mouth Rinse 10 times per day  . [START ON 02/24/2019] pantoprazole  40 mg Intravenous Q12H   Continuous Infusions: . sodium chloride 250 mL (02/22/19 0801)  . cefTRIAXone (ROCEPHIN)  IV 2 g (02/22/19 1007)  . dextrose 75 mL/hr at 02/22/19 0852  . fentaNYL infusion INTRAVENOUS Stopped (02/22/19 0730)  . insulin 1.8 Units/hr (02/22/19 0759)  . metronidazole Stopped (02/22/19 0721)  . norepinephrine (LEVOPHED) Adult infusion 40 mcg/min (02/22/19 0752)  . octreotide  (SANDOSTATIN)    IV infusion 50 mcg/hr (02/22/19 0752)  . pantoprozole (PROTONIX) infusion 8 mg/hr (02/22/19 1013)  . phenylephrine (NEO-SYNEPHRINE) Adult infusion 70 mcg/min (02/22/19 0752)  . vasopressin (PITRESSIN) infusion - *FOR SHOCK* Stopped (02/21/19 2027)   PRN Meds:.sodium chloride, fentaNYL, midazolam   Data Review:   Micro Results Recent  Results (from the past 240 hour(s))  MRSA PCR Screening     Status: None   Collection Time: 02/20/19 10:07 PM  Result Value Ref Range Status   MRSA by PCR NEGATIVE NEGATIVE Final    Comment:        The GeneXpert MRSA Assay (FDA approved for NASAL specimens only), is one component of a comprehensive MRSA colonization surveillance program. It is not intended to diagnose MRSA infection nor to guide or monitor treatment for MRSA infections. Performed at [facility], [address], [place], [place] [address]   Urine Culture     Status: None   Collection Time: 02/21/19  1:44 AM  Result Value Ref Range Status   Specimen Description   Final    URINE, RANDOM Performed at [facility], [address] [place], [place] [address]    Special Requests   Final    NONE Performed at [facility], [address] [place], [place] [address]    Culture   Final    NO GROWTH Performed at [facility], [place] [address] [place], [place] [address]    Report Status 02/22/2019 FINAL  Final  CULTURE, BLOOD (ROUTINE X 2) w Reflex to ID Panel     Status: None (Preliminary result)   Collection Time: 02/21/19  2:19 AM  Result Value Ref Range Status   Specimen Description BLOOD BLOOD LEFT FOREARM  Final   Special Requests   Final    BOTTLES DRAWN AEROBIC ONLY Blood Culture results may not be optimal due to an inadequate volume of blood received in culture bottles   Culture   Final    NO GROWTH 1 DAY Performed at [facility], [address] [place], [place] [address]    Report Status PENDING  Incomplete  CULTURE, BLOOD (ROUTINE X 2) w Reflex to ID Panel     Status: None (Preliminary result)   Collection Time: 02/21/19  2:19 AM  Result Value Ref Range Status   Specimen Description BLOOD BLOOD LEFT WRIST  Final   Special Requests   Final    BOTTLES DRAWN AEROBIC ONLY Blood Culture results may not be optimal due to an inadequate volume of blood received in  culture bottles   Culture   Final    NO GROWTH 1 DAY Performed at [facility], [address] [place], [place] [address]    Report Status PENDING  Incomplete    Radiology Reports Dg Abd 1 View  Result Date: 02/21/2019 CLINICAL DATA:  NG tube placement EXAM: ABDOMEN - 1 VIEW COMPARISON:  02/21/2019 FINDINGS: Esophageal tube tip is in the left upper quadrant. Airspace disease at the left base. Mild diffuse gaseous dilatation of the bowel IMPRESSION: Esophageal tube tip is in the left upper quadrant of the abdomen, projecting over the proximal stomach Electronically Signed   By: Donavan Foil M.D.   On: 02/21/2019 22:38   Dg Abd 1 View  Result Date: 02/21/2019 CLINICAL DATA:  OG tube placement EXAM: ABDOMEN - 1 VIEW COMPARISON:  02/21/2019 FINDINGS: Esophageal tube is looped back upon itself over the distal esophagus. Diffuse mild air distention of the bowel. Infiltrate at the left base. IMPRESSION: Esophageal tube looped back upon itself over the distal esophagus. Electronically Signed   By: Donavan Foil M.D.   On: 02/21/2019 22:37   Dg Abd 1 View  Result Date: 02/21/2019 CLINICAL DATA:  Enteric tube placement. EXAM: ABDOMEN - 1 VIEW COMPARISON:  Abdominal x-ray from yesterday. FINDINGS: Enteric tube looped in the stomach with the tip near the gastric fundus. Left femoral approach central venous catheter with tip in the left common iliac vein. Paucity of bowel gas. No evidence of obstruction. Possible 3 mm right renal calculus. No acute osseous abnormality. IMPRESSION: 1. Enteric tube looped in the stomach. 2. Left femoral approach central venous catheter tip in the left common iliac vein. 3. No acute findings. Electronically Signed   By: Titus Dubin M.D.   On: 02/21/2019 14:25   Dg Chest Port 1 View  Result Date: 02/22/2019 CLINICAL DATA:  58 year old female with respiratory failure. Aspiration of bloody gastric contents. EXAM: PORTABLE CHEST 1 VIEW COMPARISON:  420 through 20  and earlier. FINDINGS: Stable endotracheal tube tip in good position. Enteric tube courses to the midline, tip not included. Mediastinal contours remain normal. Mildly lower lung volumes. Confluent bilateral perihilar and lower lung airspace opacity with retrocardiac air bronchograms. Increased left lower lobe consolidation since yesterday with some obscuration of the left hemidiaphragm. No superimposed pneumothorax. No pleural effusion is evident. Upper lung pulmonary vascularity appears normal. IMPRESSION: 1. Stable lines and tubes. 2. Confluent bilateral perihilar and lower lung pneumonia with increased left lower lobe consolidation since yesterday. Electronically Signed   By: Genevie Ann M.D.   On: 02/22/2019 05:51   Dg Chest Port 1 View  Result Date: 02/21/2019 CLINICAL DATA:  Hematemesis. EXAM: PORTABLE CHEST 1 VIEW COMPARISON:  Chest x-ray from same day at 7:16. FINDINGS: Interval placement of an endotracheal tube with the tip 2.0 cm above the carina. Enteric tube coiled within the stomach. The heart size and mediastinal contours are within normal limits. Normal pulmonary vascularity. New patchy perihilar and bilateral lower lobe consolidations. No pleural effusion or pneumothorax. No acute osseous abnormality. IMPRESSION: 1. Appropriately positioned endotracheal and enteric tubes. 2. New patchy perihilar and bilateral lower lobe consolidations, concerning for aspiration. Electronically Signed   By: Titus Dubin M.D.   On: 02/21/2019 14:22   Dg Chest Port 1 View  Result Date: 02/21/2019 CLINICAL DATA:  Leukocytosis. EXAM: PORTABLE CHEST 1 VIEW COMPARISON:  Radiographs of February 20, 2019. FINDINGS: The heart size and mediastinal contours are within normal limits. Both lungs are clear. The visualized skeletal structures are unremarkable. IMPRESSION: No active disease. Electronically Signed   By: Marijo Conception M.D.   On: 02/21/2019 08:17   Dg Abdomen Acute W/chest  Result Date: 02/20/2019 CLINICAL  DATA:  Abdominal pain EXAM: DG ABDOMEN ACUTE W/ 1V CHEST COMPARISON:  None. FINDINGS: Moderate stool burden in the colon. The bowel gas pattern is normal. There is no evidence of free intraperitoneal air. No suspicious radio-opaque calculi or other significant radiographic abnormality is seen. Heart size and mediastinal contours are within normal limits. Both lungs are clear. IMPRESSION: Moderate stool burden.  No acute findings. Electronically Signed   By: Rolm Baptise M.D.   On: 02/20/2019 20:05     CBC Recent Labs  Lab 02/20/19 1913 02/20/19 2233 02/21/19 0551 02/21/19 1158 02/21/19 2156 02/22/19 0424 02/22/19 1003  WBC 27.7* 27.3* 21.6*  --  15.3*  --  PENDING  HGB 14.0 11.2* 8.5* 4.6* 10.3* 10.4* 10.0*  HCT 44.5 36.8 26.4* 13.6* 28.6* 29.2* 27.2*  PLT 445* 377 298  --  144*  --  146*  MCV 94.5 97.4 92.3  --  82.4  --  81.2  MCH 29.7 29.6 29.7  --  29.7  --  29.9  MCHC 31.5 30.4 32.2  --  36.0  --  36.8*  RDW 13.2 13.2 13.1  --  13.1  --  14.1  LYMPHSABS  --   --  2.5  --   --   --  PENDING  MONOABS  --   --  1.0  --   --   --  PENDING  EOSABS  --   --  0.0  --   --   --  PENDING  BASOSABS  --   --  0.0  --   --   --  PENDING    Chemistries  Recent Labs  Lab 02/20/19 1913 02/20/19 2233 02/21/19 0221 02/21/19 0551 02/21/19 1317 02/21/19 1744 02/22/19 0424  NA 135 141 146* 148* 165* 164* 158*  K 6.5* 6.4* 4.4 4.1 2.9* 2.6* 3.7  CL 98 111 122* 127* 127* 120* 116*  CO2 9* <7* 8* 10* 29 28 30   GLUCOSE 866* 705* 477* 333* 214* 195* 340*  BUN 34* 34* 39* 45* 54* 52* 56*  CREATININE 2.76* 2.15* 2.09* 1.76* 1.41* 1.93* 2.30*  CALCIUM 9.2 8.1* 7.9* 8.1* 7.2* 6.8* 6.2*  MG  --  2.6*  --   --   --  1.4* 1.4*  AST 18  --   --   --   --   --  93*  ALT 11  --   --   --   --   --  43  ALKPHOS 113  --   --   --   --   --  39  BILITOT 1.1  --   --   --   --   --  1.0    ------------------------------------------------------------------------------------------------------------------ estimated creatinine clearance is 18.1 mL/min (A) (by C-G formula based on SCr of 2.3 mg/dL (H)). ------------------------------------------------------------------------------------------------------------------ Recent Labs    02/21/19 0221  HGBA1C 10.4*   ------------------------------------------------------------------------------------------------------------------ No results for input(s): CHOL, HDL, LDLCALC, TRIG, CHOLHDL, LDLDIRECT in the last 72 hours. ------------------------------------------------------------------------------------------------------------------ No results for input(s): TSH, T4TOTAL, T3FREE, THYROIDAB in the last 72 hours.  Invalid input(s): FREET3 ------------------------------------------------------------------------------------------------------------------ No results  for input(s): VITAMINB12, FOLATE, FERRITIN, TIBC, IRON, RETICCTPCT in the last 72 hours.  Coagulation profile Recent Labs  Lab 02/21/19 0713  INR 1.5*    No results for input(s): DDIMER in the last 72 hours.  Cardiac Enzymes Recent Labs  Lab 02/21/19 2156 02/22/19 0424  TROPONINI 0.26* 0.46*   ------------------------------------------------------------------------------------------------------------------ Invalid input(s): POCBNP    Assessment & Plan  Patient is 58 year old admitted with nausea and vomiting  DKA (diabetic ketoacidoses) (HCC) -continue insulin therapy   Upper GI bleed status post EGD continue Protonix and octreotide  Hypotension related to likely upper GI bleed status post transfusion continue pressors wean as tolerated  AKI (acute kidney injury) ([place]) improved with IV hydration-aggressive IV fluids as above, avoid nephrotoxins and monitor  Leukocytosis possible reactive  Anemia follow CBC  Miscellaneous SCDs for DVT  prophylaxis      Code Status Orders  (From admission, onward)         Start     Ordered   02/20/19 2207  Full code  Continuous     02/20/19 2206        Code Status History    This patient has a current code status but no historical code status.    Advance Directive Documentation     Most Recent Value  Type of Advance Directive  Healthcare Power of Attorney [Daughter- Tasha]  Pre-existing out of facility DNR order (yellow form or pink MOST form)  -  "MOST" Form in Place?  -           Consults pulm ccm  DVT Prophylaxis scd's  Lab Results  Component Value Date   PLT 146 (L) 02/22/2019     Time Spent in minutes  44mn spent   SDustin FlockM.D on 02/22/2019 at 10:26 AM  Between 7am to 6pm - Pager - [phone]  After 6pm go to [email or web address] - [employer]   Office  [phone]

## 2019-02-23 LAB — CBC
HCT: 22 % — ABNORMAL LOW (ref 36.0–46.0)
Hemoglobin: 7.9 g/dL — ABNORMAL LOW (ref 12.0–15.0)
MCH: 29.6 pg (ref 26.0–34.0)
MCHC: 35.9 g/dL (ref 30.0–36.0)
MCV: 82.4 fL (ref 80.0–100.0)
Platelets: 95 10*3/uL — ABNORMAL LOW (ref 150–400)
RBC: 2.67 MIL/uL — ABNORMAL LOW (ref 3.87–5.11)
RDW: 14.9 % (ref 11.5–15.5)
WBC: 23.8 10*3/uL — ABNORMAL HIGH (ref 4.0–10.5)
nRBC: 0.5 % — ABNORMAL HIGH (ref 0.0–0.2)

## 2019-02-23 LAB — BASIC METABOLIC PANEL
Anion gap: 7 (ref 5–15)
Anion gap: 6 (ref 5–15)
BUN: 51 mg/dL — ABNORMAL HIGH (ref 6–20)
BUN: 46 mg/dL — ABNORMAL HIGH (ref 6–20)
CO2: 29 mmol/L (ref 22–32)
CO2: 31 mmol/L (ref 22–32)
Calcium: 6.3 mg/dL — CL (ref 8.9–10.3)
Calcium: 6.6 mg/dL — ABNORMAL LOW (ref 8.9–10.3)
Chloride: 116 mmol/L — ABNORMAL HIGH (ref 98–111)
Chloride: 113 mmol/L — ABNORMAL HIGH (ref 98–111)
Creatinine, Ser: 2.71 mg/dL — ABNORMAL HIGH (ref 0.44–1.00)
Creatinine, Ser: 2.67 mg/dL — ABNORMAL HIGH (ref 0.44–1.00)
GFR calc Af Amer: 22 mL/min — ABNORMAL LOW (ref >60)
GFR calc Af Amer: 22 mL/min — ABNORMAL LOW (ref >60)
GFR calc non Af Amer: 19 mL/min — ABNORMAL LOW (ref >60)
GFR calc non Af Amer: 19 mL/min — ABNORMAL LOW (ref >60)
Glucose, Bld: 178 mg/dL — ABNORMAL HIGH (ref 70–99)
Glucose, Bld: 158 mg/dL — ABNORMAL HIGH (ref 70–99)
Potassium: 3.9 mmol/L (ref 3.5–5.1)
Potassium: 3.6 mmol/L (ref 3.5–5.1)
Sodium: 152 mmol/L — ABNORMAL HIGH (ref 135–145)
Sodium: 150 mmol/L — ABNORMAL HIGH (ref 135–145)

## 2019-02-23 LAB — HEMOGLOBIN AND HEMATOCRIT, BLOOD
HCT: 19.9 % — ABNORMAL LOW (ref 36.0–46.0)
HCT: 20.7 % — ABNORMAL LOW (ref 36.0–46.0)
HCT: 23 % — ABNORMAL LOW (ref 36.0–46.0)
Hemoglobin: 7.1 g/dL — ABNORMAL LOW (ref 12.0–15.0)
Hemoglobin: 7.1 g/dL — ABNORMAL LOW (ref 12.0–15.0)
Hemoglobin: 8.3 g/dL — ABNORMAL LOW (ref 12.0–15.0)

## 2019-02-23 LAB — ALBUMIN: Albumin: 1.7 g/dL — ABNORMAL LOW (ref 3.5–5.0)

## 2019-02-23 LAB — GLUCOSE, CAPILLARY
Glucose-Capillary: 129 mg/dL — ABNORMAL HIGH (ref 70–99)
Glucose-Capillary: 136 mg/dL — ABNORMAL HIGH (ref 70–99)
Glucose-Capillary: 137 mg/dL — ABNORMAL HIGH (ref 70–99)
Glucose-Capillary: 158 mg/dL — ABNORMAL HIGH (ref 70–99)
Glucose-Capillary: 160 mg/dL — ABNORMAL HIGH (ref 70–99)
Glucose-Capillary: 192 mg/dL — ABNORMAL HIGH (ref 70–99)
Glucose-Capillary: 95 mg/dL (ref 70–99)

## 2019-02-23 LAB — PROCALCITONIN: Procalcitonin: 4.34 ng/mL

## 2019-02-23 LAB — PHOSPHORUS: Phosphorus: 2.5 mg/dL (ref 2.5–4.6)

## 2019-02-23 LAB — MAGNESIUM: Magnesium: 2.1 mg/dL (ref 1.7–2.4)

## 2019-02-23 MED ORDER — ADULT MULTIVITAMIN LIQUID CH
15.0000 mL | Freq: Every day | ORAL | Status: DC
  Administered 2019-02-23 – 2019-02-24 (×2): 15 mL
  Filled 2019-02-23 (×4): qty 15

## 2019-02-23 MED ORDER — FOLIC ACID 1 MG PO TABS
1.0000 mg | ORAL_TABLET | Freq: Every day | ORAL | Status: DC
  Administered 2019-02-23 – 2019-02-28 (×5): 1 mg
  Filled 2019-02-23 (×5): qty 1

## 2019-02-23 MED ORDER — SODIUM CHLORIDE 0.9% FLUSH
10.0000 mL | Freq: Two times a day (BID) | INTRAVENOUS | Status: DC
  Administered 2019-02-23 – 2019-02-25 (×5): 10 mL
  Administered 2019-02-26: 10:00:00 40 mL
  Administered 2019-02-26 – 2019-02-27 (×2): 10 mL
  Administered 2019-02-27: 30 mL

## 2019-02-23 MED ORDER — QUETIAPINE FUMARATE 25 MG PO TABS
25.0000 mg | ORAL_TABLET | Freq: Every day | ORAL | Status: DC
  Administered 2019-02-23 – 2019-02-27 (×3): 25 mg
  Filled 2019-02-23 (×4): qty 1

## 2019-02-23 MED ORDER — VITAL AF 1.2 CAL PO LIQD
1000.0000 mL | ORAL | Status: DC
  Administered 2019-02-23: 1000 mL

## 2019-02-23 MED ORDER — INSULIN ASPART 100 UNIT/ML ~~LOC~~ SOLN
0.0000 [IU] | SUBCUTANEOUS | Status: DC
  Administered 2019-02-23: 3 [IU] via SUBCUTANEOUS
  Administered 2019-02-23: 2 [IU] via SUBCUTANEOUS
  Filled 2019-02-23 (×2): qty 1

## 2019-02-23 MED ORDER — VITAMIN B-1 100 MG PO TABS
100.0000 mg | ORAL_TABLET | Freq: Every day | ORAL | Status: DC
  Administered 2019-02-23 – 2019-02-28 (×5): 100 mg
  Filled 2019-02-23 (×5): qty 1

## 2019-02-23 MED ORDER — SODIUM CHLORIDE 0.9 % IV SOLN
3.0000 g | Freq: Two times a day (BID) | INTRAVENOUS | Status: AC
  Administered 2019-02-23 – 2019-02-25 (×6): 3 g via INTRAVENOUS
  Filled 2019-02-23 (×6): qty 3

## 2019-02-23 MED ORDER — INSULIN ASPART 100 UNIT/ML ~~LOC~~ SOLN
0.0000 [IU] | SUBCUTANEOUS | Status: DC
  Administered 2019-02-23: 1 [IU] via SUBCUTANEOUS
  Administered 2019-02-23: 2 [IU] via SUBCUTANEOUS
  Administered 2019-02-23: 1 [IU] via SUBCUTANEOUS
  Administered 2019-02-24: 2 [IU] via SUBCUTANEOUS
  Administered 2019-02-24: 1 [IU] via SUBCUTANEOUS
  Administered 2019-02-24: 3 [IU] via SUBCUTANEOUS
  Administered 2019-02-25 – 2019-02-26 (×4): 1 [IU] via SUBCUTANEOUS
  Administered 2019-02-26: 2 [IU] via SUBCUTANEOUS
  Administered 2019-02-26 (×2): 1 [IU] via SUBCUTANEOUS
  Administered 2019-02-27: 3 [IU] via SUBCUTANEOUS
  Administered 2019-02-27 (×2): 5 [IU] via SUBCUTANEOUS
  Administered 2019-02-28: 2 [IU] via SUBCUTANEOUS
  Filled 2019-02-23 (×16): qty 1

## 2019-02-23 MED ORDER — SODIUM CHLORIDE 0.9 % IV SOLN
200.0000 mg | Freq: Three times a day (TID) | INTRAVENOUS | Status: AC
  Administered 2019-02-23 – 2019-02-25 (×8): 200 mg via INTRAVENOUS
  Filled 2019-02-23 (×10): qty 4

## 2019-02-23 MED ORDER — HYDROCORTISONE NA SUCCINATE PF 100 MG IJ SOLR
50.0000 mg | Freq: Two times a day (BID) | INTRAMUSCULAR | Status: AC
  Administered 2019-02-23 – 2019-02-24 (×3): 50 mg via INTRAVENOUS
  Filled 2019-02-23 (×3): qty 2

## 2019-02-23 MED ORDER — POTASSIUM CL IN DEXTROSE 5% 20 MEQ/L IV SOLN
20.0000 meq | INTRAVENOUS | Status: DC
  Administered 2019-02-23 – 2019-02-26 (×7): 20 meq via INTRAVENOUS
  Filled 2019-02-23 (×9): qty 1000

## 2019-02-23 MED ORDER — PANTOPRAZOLE SODIUM 40 MG IV SOLR
40.0000 mg | Freq: Two times a day (BID) | INTRAVENOUS | Status: DC
  Administered 2019-02-23 – 2019-02-28 (×10): 40 mg via INTRAVENOUS
  Filled 2019-02-23 (×10): qty 40

## 2019-02-23 MED ORDER — QUETIAPINE FUMARATE 25 MG PO TABS
25.0000 mg | ORAL_TABLET | Freq: Once | ORAL | Status: AC
  Administered 2019-02-23: 25 mg
  Filled 2019-02-23: qty 1

## 2019-02-23 MED ORDER — SODIUM CHLORIDE 0.9% FLUSH: 10.0000 mL | INTRAVENOUS | Status: DC | PRN

## 2019-02-23 MED ORDER — INSULIN GLARGINE 100 UNIT/ML ~~LOC~~ SOLN
6.0000 [IU] | Freq: Every day | SUBCUTANEOUS | Status: DC
  Administered 2019-02-23 – 2019-02-28 (×6): 6 [IU] via SUBCUTANEOUS
  Filled 2019-02-23 (×7): qty 0.06

## 2019-02-23 NOTE — Progress Notes (Signed)
CRITICAL CARE NOTE  CC  Follow up severe resp failure  SUBJECTIVE Remains critically ill; On vasopressors Full vent support hgb stable No more signs of bleeding    SIGNIFICANT EVENTS 4/22 admitted for DKA nad severe hemmorhagic shock 4/22 intubated, aspiration of gastric contents and blood #8 ETT and LEFT FEM LINE PLACED 4/23 remains on vasopressors, multiorgan failure 4/24 remains on vasopressors, multiorgan failure  REVIEW OF SYSTEMS  PATIENT IS UNABLE TO PROVIDE COMPLETE REVIEW OF SYSTEM S DUE TO SEVERE CRITICAL ILLNESS AND ENCEPHALOPATHY   PHYSICAL EXAMINATION:  GENERAL:critically ill appearing, +resp distress HEAD: Normocephalic, atraumatic.  EYES: Pupils equal, round, reactive to light.  No scleral icterus.  MOUTH: Moist mucosal membrane. NECK: Supple. No thyromegaly. No nodules. No JVD.  PULMONARY: +rhonchi, +wheezing CARDIOVASCULAR: S1 and S2. Regular rate and rhythm. No murmurs, rubs, or gallops.  GASTROINTESTINAL: Soft, nontender, -distended. No masses. Positive bowel sounds. No hepatosplenomegaly.  MUSCULOSKELETAL: No swelling, clubbing, or edema.  NEUROLOGIC: obtunded SKIN:intact,warm,dry      CULTURE RESULTS   Recent Results (from the past 240 hour(s))  MRSA PCR Screening     Status: None   Collection Time: 02/20/19 10:07 PM  Result Value Ref Range Status   MRSA by PCR NEGATIVE NEGATIVE Final    Comment:        The GeneXpert MRSA Assay (FDA approved for NASAL specimens only), is one component of a comprehensive MRSA colonization surveillance program. It is not intended to diagnose MRSA infection nor to guide or monitor treatment for MRSA infections. Performed at [facility], [address] [place], [place] [address]   Urine Culture     Status: None   Collection Time: 02/21/19  1:44 AM  Result Value Ref Range Status   Specimen Description   Final    URINE, RANDOM Performed at [facility], [address]  [place], [place] [address]    Special Requests   Final    NONE Performed at [facility], [address] [place], [place] [address]    Culture   Final    NO GROWTH Performed at [facility], [place] [address] [place], [place] [address]    Report Status 02/22/2019 FINAL  Final  CULTURE, BLOOD (ROUTINE X 2) w Reflex to ID Panel     Status: None (Preliminary result)   Collection Time: 02/21/19  2:19 AM  Result Value Ref Range Status   Specimen Description BLOOD BLOOD LEFT FOREARM  Final   Special Requests   Final    BOTTLES DRAWN AEROBIC ONLY Blood Culture results may not be optimal due to an inadequate volume of blood received in culture bottles   Culture   Final    NO GROWTH 2 DAYS Performed at [facility], [address], [place], [place] [address]    Report Status PENDING  Incomplete  CULTURE, BLOOD (ROUTINE X 2) w Reflex to ID Panel     Status: None (Preliminary result)   Collection Time: 02/21/19  2:19 AM  Result Value Ref Range Status   Specimen Description BLOOD BLOOD LEFT WRIST  Final   Special Requests   Final    BOTTLES DRAWN AEROBIC ONLY Blood Culture results may not be optimal due to an inadequate volume of blood received in culture bottles   Culture   Final    NO GROWTH 2 DAYS Performed at [facility], [address] [place], [place] [address]    Report Status PENDING  Incomplete  Indwelling Urinary Catheter continued, requirement due to   Reason to continue Indwelling Urinary Catheter for strict Intake/Output monitoring for hemodynamic instability   Central Line continued, requirement due to   Reason to continue [employer] Monitoring of central venous pressure or other hemodynamic parameters   Ventilator continued, requirement due to, resp failure    Ventilator Sedation RASS 0 to -2       ASSESSMENT AND PLAN SYNOPSIS  Severe ACUTE Hypoxic and Hypercapnic Respiratory Failure s/p aspiration of  blood/gastric contents from acute GIB with acute encephalopathy from acidosis and severe hemorrhagic shock  Severe ACUTE Hypoxic and Hypercapnic Respiratory Failure -continue Mechanical Ventilator support -continue Bronchodilator Therapy -Wean Fio2 and PEEP as tolerated -VAP/VENT bundle implementation -will perform SAT/SBT when respiratory parameters are met   ACUTE KIDNEY INJURY/Renal Failure -follow chem 7 -follow UO -continue Foley Catheter-assess need    NEUROLOGY - intubated and sedated - minimal sedation to achieve a RASS goal: -1   Hypovolumic shock Will give more IVF's today Wean off pressors as tolerated Stress dose steroids   ACUTE GIB DIET-->NPO OCTREOTIDE AND PROTONIX INFUSIONS Follow Gi RECS   ACUTE ANEMIA- TRANSFUSE AS NEEDED DVT PRX with TED/SCD's ONLY  ENDO - ICU hypoglycemic\Hyperglycemia protocol -check FSBS per protocol  ELECTROLYTES -follow labs as needed -replace as needed -pharmacy consultation and following    DVT/GI PRX ordered TRANSFUSIONS AS NEEDED MONITOR FSBS ASSESS the need for LABS as needed     Critical Care Time devoted to patient care services described in this note is 32 minutes.   Overall, patient is critically ill, prognosis is guarded.  Patient with Multiorgan failure and at high risk for cardiac arrest and death.    Corrin Parker, M.D.  [facility]  Medical Director [place] Director [facility] Cardio-Pulmonary Department

## 2019-02-23 NOTE — Progress Notes (Signed)
Inpatient Diabetes Program Recommendations  AACE/ADA: New Consensus Statement on Inpatient Glycemic Control (2015)  Target Ranges:  Prepandial:   less than 140 mg/dL      Peak postprandial:   less than 180 mg/dL (1-2 hours)      Critically ill patients:  140 - 180 mg/dL   Lab Results  Component Value Date   GLUCAP 158 (H) 02/23/2019   HGBA1C 10.4 (H) 02/21/2019    Review of Glycemic Control Results for CHEVELLA, RUBI (MRN [id number]) as of 02/23/2019 08:35  Ref. Range 02/22/2019 22:54 02/22/2019 23:59 02/23/2019 03:43 02/23/2019 07:47  Glucose-Capillary Latest Ref Range: 70 - 99 mg/dL 87 95 938 (H) 182 (H)  Diabetes history:DM Outpatient Diabetes medications:Levemir 15 units q HS, Novolog 5 units tid with meals Current orders for Inpatient glycemic control: Novolog moderate q 4 hours Inpatient Diabetes Program Recommendations:   Please consider adding Lantus 6 units daily.  Also may need to reduce correction to sensitive q 4 hours.   Thanks,  Beryl Meager, RN, BC-ADM Inpatient Diabetes Coordinator Pager [phone] (8a-5p)

## 2019-02-23 NOTE — Progress Notes (Signed)
[place] [facility]                                                                                                                                                                                  Patient Demographics   Bethany James, is a 58 y.o. female, DOB - February 21, 1961, MWU:[id number]  Admit date - 02/20/2019   Admitting Physician Lance Coon, MD  Outpatient Primary MD for the patient is System, Pcp Not In   LOS - 3  Subjective: Patient remains intubated Review of Systems:   CONSTITUTIONAL: Intubated Vitals:   Vitals:   02/23/19 0800 02/23/19 0900 02/23/19 1000 02/23/19 1100  BP: 110/71 (!) 88/56 (!) 85/55 (!) 85/64  Pulse: 91 75 68 94  Resp: (!) 22 18 18  (!) 22  Temp: (!) 97.5 F (36.4 C)     TempSrc: Axillary     SpO2: 97% 100% 100% 96%  Weight:      Height:        Wt Readings from Last 3 Encounters:  02/20/19 48.6 kg  08/13/18 59 kg  04/06/18 71.2 kg     Intake/Output Summary (Last 24 hours) at 02/23/2019 1201 Last data filed at 02/23/2019 0700 Gross per 24 hour  Intake 3586.71 ml  Output 1100 ml  Net 2486.71 ml    Physical Exam:   GENERAL: Critically ill-appearing.  Intubated HEAD, EYES, EARS, NOSE AND THROAT: Atraumatic, normocephalic. . Pupils equal and reactive to light. Sclerae anicteric. No conjunctival injection. No oro-pharyngeal erythema.  NECK: Supple. There is no jugular venous distention. No bruits, no lymphadenopathy, no thyromegaly.  HEART: Regular rate and rhythm,. No murmurs, no rubs, no clicks.  LUNGS: Clear to auscultation bilaterally. No rales or rhonchi. No wheezes.  ABDOMEN: Soft, flat, nontender, nondistended. Has good bowel sounds. No hepatosplenomegaly appreciated.  EXTREMITIES: No evidence of any cyanosis, clubbing, or peripheral edema.  +2 pedal and radial pulses bilaterally.  NEUROLOGIC: Sedated SKIN: Moist and warm with no rashes appreciated.  Psych: Not anxious, depressed LN: No inguinal LN  enlargement    Antibiotics   Anti-infectives (From admission, onward)   Start     Dose/Rate Route Frequency Ordered Stop   02/23/19 1400  erythromycin 200 mg in sodium chloride 0.9 % 100 mL IVPB     200 mg 100 mL/hr over 60 Minutes Intravenous Every 8 hours 02/23/19 1041 02/26/19 0559   02/23/19 1200  Ampicillin-Sulbactam (UNASYN) 3 g in sodium chloride 0.9 % 100 mL IVPB     3 g 200 mL/hr over 30 Minutes Intravenous 2 times daily 02/23/19 1037 02/26/19 0959   02/21/19 1930  metroNIDAZOLE (FLAGYL) IVPB 500 mg  Status:  Discontinued     500 mg 100 mL/hr over 60 Minutes Intravenous Every 6 hours 02/21/19 1923 02/23/19 1037   02/21/19 1400  cefTRIAXone (ROCEPHIN) 2 g in sodium chloride 0.9 % 100 mL IVPB  Status:  Discontinued     2 g 200 mL/hr over 30 Minutes Intravenous Daily 02/21/19 1324 02/23/19 1037   02/21/19 1400  erythromycin 250 mg in sodium chloride 0.9 % 100 mL IVPB     250 mg 100 mL/hr over 60 Minutes Intravenous STAT 02/21/19 1335 02/21/19 1538   02/21/19 0145  ceFEPIme (MAXIPIME) 2 g in sodium chloride 0.9 % 100 mL IVPB  Status:  Discontinued     2 g 200 mL/hr over 30 Minutes Intravenous Every 24 hours 02/21/19 0141 02/21/19 0652      Medications   Scheduled Meds: . chlorhexidine gluconate (MEDLINE KIT)  15 mL Mouth Rinse BID  . folic acid  1 mg Per Tube Daily  . hydrocortisone sod succinate (SOLU-CORTEF) inj  50 mg Intravenous Q12H  . insulin aspart  0-9 Units Subcutaneous Q4H  . insulin glargine  6 Units Subcutaneous Daily  . mouth rinse  15 mL Mouth Rinse 10 times per day  . multivitamin  15 mL Per Tube Daily  . pantoprazole (PROTONIX) IV  40 mg Intravenous Q12H  . QUEtiapine  25 mg Per Tube QHS  . sodium chloride flush  10-40 mL Intracatheter Q12H  . thiamine  100 mg Per Tube Daily   Continuous Infusions: . sodium chloride Stopped (02/22/19 1223)  . ampicillin-sulbactam (UNASYN) IV    . dextrose 100 mL/hr at 02/23/19 0800  . erythromycin    . feeding  supplement (VITAL AF 1.2 CAL) 1,000 mL (02/23/19 1142)  . fentaNYL infusion INTRAVENOUS 75 mcg/hr (02/23/19 0549)   PRN Meds:.sodium chloride, fentaNYL, midazolam, sodium chloride flush   Data Review:   Micro Results Recent Results (from the past 240 hour(s))  MRSA PCR Screening     Status: None   Collection Time: 02/20/19 10:07 PM  Result Value Ref Range Status   MRSA by PCR NEGATIVE NEGATIVE Final    Comment:        The GeneXpert MRSA Assay (FDA approved for NASAL specimens only), is one component of a comprehensive MRSA colonization surveillance program. It is not intended to diagnose MRSA infection nor to guide or monitor treatment for MRSA infections. Performed at [facility], [address] [place], [place] [address]   Urine Culture     Status: None   Collection Time: 02/21/19  1:44 AM  Result Value Ref Range Status   Specimen Description   Final    URINE, RANDOM Performed at [facility], [address] [place], [place] [address]    Special Requests   Final    NONE Performed at [facility], [address], [place], [place] [address]    Culture   Final    NO GROWTH Performed at [facility], [place] [address], [place], [place] [address]    Report Status 02/22/2019 FINAL  Final  CULTURE, BLOOD (ROUTINE X 2) w Reflex to ID Panel     Status: None (Preliminary result)   Collection Time: 02/21/19  2:19 AM  Result Value Ref Range Status   Specimen Description BLOOD BLOOD LEFT FOREARM  Final   Special Requests   Final    BOTTLES DRAWN AEROBIC ONLY Blood Culture results may not be optimal due to an inadequate volume of blood received in culture  bottles   Culture   Final    NO GROWTH 2 DAYS Performed at [facility], [address] [place], [place] [address]    Report Status PENDING  Incomplete  CULTURE, BLOOD (ROUTINE X 2) w Reflex to ID Panel     Status: None (Preliminary result)   Collection Time: 02/21/19  2:19 AM   Result Value Ref Range Status   Specimen Description BLOOD BLOOD LEFT WRIST  Final   Special Requests   Final    BOTTLES DRAWN AEROBIC ONLY Blood Culture results may not be optimal due to an inadequate volume of blood received in culture bottles   Culture   Final    NO GROWTH 2 DAYS Performed at [facility], [address], [place], [place] [address]    Report Status PENDING  Incomplete    Radiology Reports Dg Abd 1 View  Result Date: 02/21/2019 CLINICAL DATA:  NG tube placement EXAM: ABDOMEN - 1 VIEW COMPARISON:  02/21/2019 FINDINGS: Esophageal tube tip is in the left upper quadrant. Airspace disease at the left base. Mild diffuse gaseous dilatation of the bowel IMPRESSION: Esophageal tube tip is in the left upper quadrant of the abdomen, projecting over the proximal stomach Electronically Signed   By: Donavan Foil M.D.   On: 02/21/2019 22:38   Dg Abd 1 View  Result Date: 02/21/2019 CLINICAL DATA:  OG tube placement EXAM: ABDOMEN - 1 VIEW COMPARISON:  02/21/2019 FINDINGS: Esophageal tube is looped back upon itself over the distal esophagus. Diffuse mild air distention of the bowel. Infiltrate at the left base. IMPRESSION: Esophageal tube looped back upon itself over the distal esophagus. Electronically Signed   By: Donavan Foil M.D.   On: 02/21/2019 22:37   Dg Abd 1 View  Result Date: 02/21/2019 CLINICAL DATA:  Enteric tube placement. EXAM: ABDOMEN - 1 VIEW COMPARISON:  Abdominal x-ray from yesterday. FINDINGS: Enteric tube looped in the stomach with the tip near the gastric fundus. Left femoral approach central venous catheter with tip in the left common iliac vein. Paucity of bowel gas. No evidence of obstruction. Possible 3 mm right renal calculus. No acute osseous abnormality. IMPRESSION: 1. Enteric tube looped in the stomach. 2. Left femoral approach central venous catheter tip in the left common iliac vein. 3. No acute findings. Electronically Signed   By: Titus Dubin  M.D.   On: 02/21/2019 14:25   Dg Chest Port 1 View  Result Date: 02/22/2019 CLINICAL DATA:  58 year old female with respiratory failure. Aspiration of bloody gastric contents. EXAM: PORTABLE CHEST 1 VIEW COMPARISON:  420 through 20 and earlier. FINDINGS: Stable endotracheal tube tip in good position. Enteric tube courses to the midline, tip not included. Mediastinal contours remain normal. Mildly lower lung volumes. Confluent bilateral perihilar and lower lung airspace opacity with retrocardiac air bronchograms. Increased left lower lobe consolidation since yesterday with some obscuration of the left hemidiaphragm. No superimposed pneumothorax. No pleural effusion is evident. Upper lung pulmonary vascularity appears normal. IMPRESSION: 1. Stable lines and tubes. 2. Confluent bilateral perihilar and lower lung pneumonia with increased left lower lobe consolidation since yesterday. Electronically Signed   By: Genevie Ann M.D.   On: 02/22/2019 05:51   Dg Chest Port 1 View  Result Date: 02/21/2019 CLINICAL DATA:  Hematemesis. EXAM: PORTABLE CHEST 1 VIEW COMPARISON:  Chest x-ray from same day at 7:16. FINDINGS: Interval placement of an endotracheal tube with the tip 2.0 cm above the carina. Enteric tube coiled within the stomach. The  heart size and mediastinal contours are within normal limits. Normal pulmonary vascularity. New patchy perihilar and bilateral lower lobe consolidations. No pleural effusion or pneumothorax. No acute osseous abnormality. IMPRESSION: 1. Appropriately positioned endotracheal and enteric tubes. 2. New patchy perihilar and bilateral lower lobe consolidations, concerning for aspiration. Electronically Signed   By: Titus Dubin M.D.   On: 02/21/2019 14:22   Dg Chest Port 1 View  Result Date: 02/21/2019 CLINICAL DATA:  Leukocytosis. EXAM: PORTABLE CHEST 1 VIEW COMPARISON:  Radiographs of February 20, 2019. FINDINGS: The heart size and mediastinal contours are within normal limits. Both  lungs are clear. The visualized skeletal structures are unremarkable. IMPRESSION: No active disease. Electronically Signed   By: Marijo Conception M.D.   On: 02/21/2019 08:17   Dg Abdomen Acute W/chest  Result Date: 02/20/2019 CLINICAL DATA:  Abdominal pain EXAM: DG ABDOMEN ACUTE W/ 1V CHEST COMPARISON:  None. FINDINGS: Moderate stool burden in the colon. The bowel gas pattern is normal. There is no evidence of free intraperitoneal air. No suspicious radio-opaque calculi or other significant radiographic abnormality is seen. Heart size and mediastinal contours are within normal limits. Both lungs are clear. IMPRESSION: Moderate stool burden.  No acute findings. Electronically Signed   By: Rolm Baptise M.D.   On: 02/20/2019 20:05     CBC Recent Labs  Lab 02/20/19 2233 02/21/19 0551 02/21/19 2156 02/22/19 0424 02/22/19 1003 02/22/19 1649 02/23/19 0020 02/23/19 0533  WBC 27.3* 21.6* 15.3*  --  23.4*  --   --  23.8*  HGB 11.2* 8.5* 10.3* 10.4* 10.0* 9.5* 8.3* 7.9*  HCT 36.8 26.4* 28.6* 29.2* 27.2* 25.9* 23.0* 22.0*  PLT 377 298 144*  --  146*  --   --  95*  MCV 97.4 92.3 82.4  --  81.2  --   --  82.4  MCH 29.6 29.7 29.7  --  29.9  --   --  29.6  MCHC 30.4 32.2 36.0  --  36.8*  --   --  35.9  RDW 13.2 13.1 13.1  --  14.1  --   --  14.9  LYMPHSABS  --  2.5  --   --  3.3  --   --   --   MONOABS  --  1.0  --   --  0.3  --   --   --   EOSABS  --  0.0  --   --  0.3  --   --   --   BASOSABS  --  0.0  --   --  0.1  --   --   --     Chemistries  Recent Labs  Lab 02/20/19 1913 02/20/19 2233  02/21/19 1317 02/21/19 1744 02/22/19 0424 02/22/19 1649 02/23/19 0533  NA 135 141   < > 165* 164* 158* 155* 152*  K 6.5* 6.4*   < > 2.9* 2.6* 3.7 3.3* 3.9  CL 98 111   < > 127* 120* 116* 119* 116*  CO2 9* <7*   < > 29 28 30 29 29   GLUCOSE 866* 705*   < > 214* 195* 340* 78 178*  BUN 34* 34*   < > 54* 52* 56* 56* 51*  CREATININE 2.76* 2.15*   < > 1.41* 1.93* 2.30* 2.60* 2.71*  CALCIUM 9.2 8.1*   <  > 7.2* 6.8* 6.2* 6.2* 6.3*  MG  --  2.6*  --   --  1.4* 1.4*  --  2.1  AST 18  --   --   --   --  93*  --   --   ALT 11  --   --   --   --  43  --   --   ALKPHOS 113  --   --   --   --  39  --   --   BILITOT 1.1  --   --   --   --  1.0  --   --    < > = values in this interval not displayed.   ------------------------------------------------------------------------------------------------------------------ estimated creatinine clearance is 15.4 mL/min (A) (by C-G formula based on SCr of 2.71 mg/dL (H)). ------------------------------------------------------------------------------------------------------------------ Recent Labs    02/21/19 0221  HGBA1C 10.4*   ------------------------------------------------------------------------------------------------------------------ No results for input(s): CHOL, HDL, LDLCALC, TRIG, CHOLHDL, LDLDIRECT in the last 72 hours. ------------------------------------------------------------------------------------------------------------------ No results for input(s): TSH, T4TOTAL, T3FREE, THYROIDAB in the last 72 hours.  Invalid input(s): FREET3 ------------------------------------------------------------------------------------------------------------------ No results for input(s): VITAMINB12, FOLATE, FERRITIN, TIBC, IRON, RETICCTPCT in the last 72 hours.  Coagulation profile Recent Labs  Lab 02/21/19 0713  INR 1.5*    No results for input(s): DDIMER in the last 72 hours.  Cardiac Enzymes Recent Labs  Lab 02/21/19 2156 02/22/19 0424 02/22/19 1003  TROPONINI 0.26* 0.46* 0.42*   ------------------------------------------------------------------------------------------------------------------ Invalid input(s): POCBNP    Assessment & Plan  Patient is 57 year old admitted with nausea and vomiting  #1 DKA (diabetic ketoacidoses) ([place]) -resolved continue Lantus and sliding scale insulin   #2 upper GI bleed status post EGD continue  Protonix IV twice daily octreotide has been discontinued  #3 hypotension related to likely upper GI bleed status post transfusion continue pressors wean as tolerated Continue stress dose steroids and antibiotics  #4 AKI (acute kidney injury) ([place])  Currently stable no significant change compared to yesterday  #5 leukocytosis possible reactive  #6 anemia due to upper GI bleed follow CBC may need further transfusions   miscellaneous SCDs for DVT prophylaxis      Code Status Orders  (From admission, onward)         Start     Ordered   02/20/19 2207  Full code  Continuous     02/20/19 2206        Code Status History    This patient has a current code status but no historical code status.    Advance Directive Documentation     Most Recent Value  Type of Advance Directive  Healthcare Power of Attorney [Daughter- Tasha]  Pre-existing out of facility DNR order (yellow form or pink MOST form)  -  "MOST" Form in Place?  -           Consults pulm ccm  DVT Prophylaxis scd's  Lab Results  Component Value Date   PLT 95 (L) 02/23/2019     Time Spent in minutes  58mn spent   SDustin FlockM.D on 02/23/2019 at 12:01 PM  Between 7am to 6pm - Pager - [phone]  After 6pm go to [email or web address] - [employer]   Office  [phone]

## 2019-02-23 NOTE — Progress Notes (Addendum)
GI Inpatient Follow-up Note  Subjective:  Patient seen for f/u UGI bleed. Patient remains critically ill and on vasopressors with full ventilator support. Hemoglobin 7.9 this morning (down from 9.5 yesterday) with no signs of recurrent GI bleeding. Per nursing, stool output has decreased overnight.  Scheduled Inpatient Medications:  . chlorhexidine gluconate (MEDLINE KIT)  15 mL Mouth Rinse BID  . folic acid  1 mg Per Tube Daily  . hydrocortisone sod succinate (SOLU-CORTEF) inj  50 mg Intravenous Q12H  . insulin aspart  0-9 Units Subcutaneous Q4H  . insulin glargine  6 Units Subcutaneous Daily  . mouth rinse  15 mL Mouth Rinse 10 times per day  . multivitamin  15 mL Per Tube Daily  . pantoprazole (PROTONIX) IV  40 mg Intravenous Q12H  . QUEtiapine  25 mg Per Tube QHS  . QUEtiapine  25 mg Per Tube Once  . sodium chloride flush  10-40 mL Intracatheter Q12H  . thiamine  100 mg Per Tube Daily    Continuous Inpatient Infusions:   . sodium chloride Stopped (02/22/19 1223)  . ampicillin-sulbactam (UNASYN) IV    . dextrose 100 mL/hr at 02/23/19 0800  . erythromycin    . fentaNYL infusion INTRAVENOUS 75 mcg/hr (02/23/19 0549)    PRN Inpatient Medications:  sodium chloride, fentaNYL, midazolam, sodium chloride flush  Review of Systems: Unable to obtain due to patient's mental status  Physical Examination: BP (!) 88/56   Pulse 75   Temp (!) 97.5 F (36.4 C) (Axillary)   Resp 18   Ht 4' 9" (1.448 m)   Wt 48.6 kg   SpO2 100%   BMI 23.19 kg/m  Gen: Critically ill appearing, + moderate distress HEENT: Normocephalic, atraumatic  Neck: supple, no JVD or thyromegaly Chest: +rhonchi, + wheezing CV: RRR, no m/g/c/r Abd: distended abdomen, nontender, hypoactive bowel sounds; no HSM, guarding, ridigity, or rebound tenderness Ext: no edema, well perfused with 2+ pulses, Skin: no rash or lesions noted Lymph: no LAD  Data: Lab Results  Component Value Date   WBC 23.8 (H) 02/23/2019    HGB 7.9 (L) 02/23/2019   HCT 22.0 (L) 02/23/2019   MCV 82.4 02/23/2019   PLT 95 (L) 02/23/2019   Recent Labs  Lab 02/22/19 1649 02/23/19 0020 02/23/19 0533  HGB 9.5* 8.3* 7.9*   Lab Results  Component Value Date   NA 152 (H) 02/23/2019   K 3.9 02/23/2019   CL 116 (H) 02/23/2019   CO2 29 02/23/2019   BUN 51 (H) 02/23/2019   CREATININE 2.71 (H) 02/23/2019   Lab Results  Component Value Date   ALT 43 02/22/2019   AST 93 (H) 02/22/2019   ALKPHOS 39 02/22/2019   BILITOT 1.0 02/22/2019   Recent Labs  Lab 02/21/19 0713  APTT 32  INR 1.5*     Assessment:  1. UGI bleed - presumably from duodenal ulcers. Hemoglobin is trending down (today 7.9) with no signs of recurrent GI bleeding. Still having dark stool output. 2. Aspiration pneumonia with respiratory failure. On Unasyn. 3. DKA  4. Hypovolemic shock - still on pressors, multiorgan failure  Plan:  1. Continue IV protonix. Octreotide has been discontinued.  2. Serial H/H. 3. Rescope for any overt rebleeding. 4. Following closely.   Please call with questions or concerns.    Octavia Bruckner, PA-C [facility] [phone] (Cell)

## 2019-02-24 ENCOUNTER — Encounter
Admission: EM | Disposition: A | Discharge status: Home / Self Care | Payer: Self-pay | Source: Home / Self Care | Attending: Internal Medicine

## 2019-02-24 ENCOUNTER — Encounter: Payer: Self-pay | Admitting: Anesthesiology

## 2019-02-24 HISTORY — PX: ESOPHAGOGASTRODUODENOSCOPY (EGD) WITH PROPOFOL: SHX5813

## 2019-02-24 LAB — CBC
HCT: 18.9 % — ABNORMAL LOW (ref 36.0–46.0)
HCT: 18.7 % — ABNORMAL LOW (ref 36.0–46.0)
HCT: 24.2 % — ABNORMAL LOW (ref 36.0–46.0)
Hemoglobin: 6.6 g/dL — ABNORMAL LOW (ref 12.0–15.0)
Hemoglobin: 6.5 g/dL — ABNORMAL LOW (ref 12.0–15.0)
Hemoglobin: 8.5 g/dL — ABNORMAL LOW (ref 12.0–15.0)
MCH: 29.7 pg (ref 26.0–34.0)
MCH: 29.8 pg (ref 26.0–34.0)
MCH: 30 pg (ref 26.0–34.0)
MCHC: 34.9 g/dL (ref 30.0–36.0)
MCHC: 34.8 g/dL (ref 30.0–36.0)
MCHC: 35.1 g/dL (ref 30.0–36.0)
MCV: 85.1 fL (ref 80.0–100.0)
MCV: 85.8 fL (ref 80.0–100.0)
MCV: 85.5 fL (ref 80.0–100.0)
Platelets: 65 10*3/uL — ABNORMAL LOW (ref 150–400)
Platelets: 70 10*3/uL — ABNORMAL LOW (ref 150–400)
Platelets: 80 10*3/uL — ABNORMAL LOW (ref 150–400)
RBC: 2.22 MIL/uL — ABNORMAL LOW (ref 3.87–5.11)
RBC: 2.18 MIL/uL — ABNORMAL LOW (ref 3.87–5.11)
RBC: 2.83 MIL/uL — ABNORMAL LOW (ref 3.87–5.11)
RDW: 15.2 % (ref 11.5–15.5)
RDW: 15.3 % (ref 11.5–15.5)
RDW: 14.5 % (ref 11.5–15.5)
WBC: 18.1 10*3/uL — ABNORMAL HIGH (ref 4.0–10.5)
WBC: 17.7 10*3/uL — ABNORMAL HIGH (ref 4.0–10.5)
WBC: 25.9 10*3/uL — ABNORMAL HIGH (ref 4.0–10.5)
nRBC: 0.3 % — ABNORMAL HIGH (ref 0.0–0.2)
nRBC: 0.2 % (ref 0.0–0.2)
nRBC: 0.2 % (ref 0.0–0.2)

## 2019-02-24 LAB — BASIC METABOLIC PANEL
Anion gap: 5 (ref 5–15)
Anion gap: 3 — ABNORMAL LOW (ref 5–15)
Anion gap: 5 (ref 5–15)
BUN: 41 mg/dL — ABNORMAL HIGH (ref 6–20)
BUN: 40 mg/dL — ABNORMAL HIGH (ref 6–20)
BUN: 33 mg/dL — ABNORMAL HIGH (ref 6–20)
CO2: 29 mmol/L (ref 22–32)
CO2: 31 mmol/L (ref 22–32)
CO2: 31 mmol/L (ref 22–32)
Calcium: 6.4 mg/dL — CL (ref 8.9–10.3)
Calcium: 6.5 mg/dL — ABNORMAL LOW (ref 8.9–10.3)
Calcium: 7.3 mg/dL — ABNORMAL LOW (ref 8.9–10.3)
Chloride: 112 mmol/L — ABNORMAL HIGH (ref 98–111)
Chloride: 112 mmol/L — ABNORMAL HIGH (ref 98–111)
Chloride: 108 mmol/L (ref 98–111)
Creatinine, Ser: 2.63 mg/dL — ABNORMAL HIGH (ref 0.44–1.00)
Creatinine, Ser: 2.39 mg/dL — ABNORMAL HIGH (ref 0.44–1.00)
Creatinine, Ser: 2.23 mg/dL — ABNORMAL HIGH (ref 0.44–1.00)
GFR calc Af Amer: 22 mL/min — ABNORMAL LOW (ref >60)
GFR calc Af Amer: 25 mL/min — ABNORMAL LOW (ref >60)
GFR calc Af Amer: 27 mL/min — ABNORMAL LOW (ref >60)
GFR calc non Af Amer: 19 mL/min — ABNORMAL LOW (ref >60)
GFR calc non Af Amer: 22 mL/min — ABNORMAL LOW (ref >60)
GFR calc non Af Amer: 24 mL/min — ABNORMAL LOW (ref >60)
Glucose, Bld: 237 mg/dL — ABNORMAL HIGH (ref 70–99)
Glucose, Bld: 214 mg/dL — ABNORMAL HIGH (ref 70–99)
Glucose, Bld: 109 mg/dL — ABNORMAL HIGH (ref 70–99)
Potassium: 3.6 mmol/L (ref 3.5–5.1)
Potassium: 3.3 mmol/L — ABNORMAL LOW (ref 3.5–5.1)
Potassium: 3.3 mmol/L — ABNORMAL LOW (ref 3.5–5.1)
Sodium: 146 mmol/L — ABNORMAL HIGH (ref 135–145)
Sodium: 146 mmol/L — ABNORMAL HIGH (ref 135–145)
Sodium: 144 mmol/L (ref 135–145)

## 2019-02-24 LAB — GLUCOSE, CAPILLARY
Glucose-Capillary: 221 mg/dL — ABNORMAL HIGH (ref 70–99)
Glucose-Capillary: 135 mg/dL — ABNORMAL HIGH (ref 70–99)
Glucose-Capillary: 98 mg/dL (ref 70–99)
Glucose-Capillary: 81 mg/dL (ref 70–99)
Glucose-Capillary: 89 mg/dL (ref 70–99)

## 2019-02-24 LAB — HEMOGLOBIN AND HEMATOCRIT, BLOOD
HCT: 23.9 % — ABNORMAL LOW (ref 36.0–46.0)
Hemoglobin: 8.5 g/dL — ABNORMAL LOW (ref 12.0–15.0)

## 2019-02-24 LAB — PHOSPHORUS
Phosphorus: 2.6 mg/dL (ref 2.5–4.6)
Phosphorus: 2.5 mg/dL (ref 2.5–4.6)

## 2019-02-24 LAB — MAGNESIUM
Magnesium: 1.8 mg/dL (ref 1.7–2.4)
Magnesium: 1.8 mg/dL (ref 1.7–2.4)
Magnesium: 1.8 mg/dL (ref 1.7–2.4)

## 2019-02-24 LAB — PREPARE RBC (CROSSMATCH)

## 2019-02-24 SURGERY — ESOPHAGOGASTRODUODENOSCOPY (EGD) WITH PROPOFOL
Anesthesia: General

## 2019-02-24 MED ORDER — POTASSIUM CHLORIDE 20 MEQ PO PACK
20.0000 meq | PACK | Freq: Once | ORAL | Status: AC
  Administered 2019-02-24: 20 meq
  Filled 2019-02-24: qty 1

## 2019-02-24 MED ORDER — EPINEPHRINE 1 MG/10ML IJ SOSY
PREFILLED_SYRINGE | INTRAMUSCULAR | Status: AC
  Filled 2019-02-24: qty 10

## 2019-02-24 MED ORDER — SODIUM CHLORIDE 0.9% IV SOLUTION
Freq: Once | INTRAVENOUS | Status: AC
  Administered 2019-02-24: 06:00:00 via INTRAVENOUS

## 2019-02-24 MED ORDER — SODIUM CHLORIDE (PF) 0.9 % IJ SOLN
PREFILLED_SYRINGE | INTRAMUSCULAR | Status: DC | PRN
  Administered 2019-02-24: 17:00:00 2 mL

## 2019-02-24 MED ORDER — PROPOFOL 1000 MG/100ML IV EMUL: 5.0000 ug/kg/min | INTRAVENOUS | Status: DC

## 2019-02-24 MED ORDER — PROPOFOL 1000 MG/100ML IV EMUL
INTRAVENOUS | Status: AC
  Filled 2019-02-24: qty 100

## 2019-02-24 NOTE — Progress Notes (Signed)
[facility] Gastroenterology Inpatient Progress Note  Subjective: Patient seen for f/u UGI bleed. Patient appears to continue having black stools and slorequiring another unit of prbc transfusion. More alert. Improved hemodynamically, no longer requiring pressors.   Objective: Vital signs in last 24 hours: Temp:  [98.3 F (36.8 C)-98.9 F (37.2 C)] 98.5 F (36.9 C) (04/25 1200) Pulse Rate:  [62-102] 69 (04/25 1400) Resp:  [16-28] 18 (04/25 1400) BP: (82-126)/(47-73) 104/62 (04/25 1400) SpO2:  [89 %-100 %] 100 % (04/25 1400) FiO2 (%):  [40 %] 40 % (04/25 1200) Blood pressure 104/62, pulse 69, temperature 98.5 F (36.9 C), temperature source Oral, resp. rate 18, height 4' 9"  (1.448 m), weight 48.6 kg, SpO2 100 %.    Intake/Output from previous day: 04/24 0701 - 04/25 0700 In: 3853.7 [I.V.:2873.4; NG/GT:380.3; IV Piggyback:600] Out: 2400 [Urine:2350; Stool:50]  Intake/Output this shift: Total I/O In: 401.8 [I.V.:21.4; Blood:247.9; NG/GT:45.7; IV Piggyback:86.8] Out: 495 [Urine:295; Stool:200]   General appearance:  Arousable, opens eyes spontaneously, intubated on ventilatory support. Resp:  Coarse  Cardio: RRR GI:  Soft, moderately distened. BS+. Extremities: Trace edema.   Lab Results: Results for orders placed or performed during the hospital encounter of 02/20/19 (from the past 24 hour(s))  Hemoglobin and hematocrit, blood     Status: Abnormal   Collection Time: 02/23/19  3:41 PM  Result Value Ref Range   Hemoglobin 7.1 (L) 12.0 - 15.0 g/dL   HCT 20.7 (L) 36.0 - 51.0 %  Basic metabolic panel     Status: Abnormal   Collection Time: 02/23/19  3:41 PM  Result Value Ref Range   Sodium 150 (H) 135 - 145 mmol/L   Potassium 3.6 3.5 - 5.1 mmol/L   Chloride 113 (H) 98 - 111 mmol/L   CO2 31 22 - 32 mmol/L   Glucose, Bld 158 (H) 70 - 99 mg/dL   BUN 46 (H) 6 - 20 mg/dL   Creatinine, Ser 2.67 (H) 0.44 - 1.00 mg/dL   Calcium 6.6 (L) 8.9 - 10.3 mg/dL   GFR calc non Af  Amer 19 (L) >60 mL/min   GFR calc Af Amer 22 (L) >60 mL/min   Anion gap 6 5 - 15  Glucose, capillary     Status: Abnormal   Collection Time: 02/23/19  4:40 PM  Result Value Ref Range   Glucose-Capillary 129 (H) 70 - 99 mg/dL  Hemoglobin and hematocrit, blood     Status: Abnormal   Collection Time: 02/23/19  7:21 PM  Result Value Ref Range   Hemoglobin 7.1 (L) 12.0 - 15.0 g/dL   HCT 19.9 (L) 36.0 - 46.0 %  Glucose, capillary     Status: Abnormal   Collection Time: 02/23/19  7:28 PM  Result Value Ref Range   Glucose-Capillary 160 (H) 70 - 99 mg/dL  Glucose, capillary     Status: Abnormal   Collection Time: 02/23/19 11:34 PM  Result Value Ref Range   Glucose-Capillary 192 (H) 70 - 99 mg/dL  CBC     Status: Abnormal   Collection Time: 02/24/19 12:47 AM  Result Value Ref Range   WBC 18.1 (H) 4.0 - 10.5 K/uL   RBC 2.22 (L) 3.87 - 5.11 MIL/uL   Hemoglobin 6.6 (L) 12.0 - 15.0 g/dL   HCT 18.9 (L) 36.0 - 46.0 %   MCV 85.1 80.0 - 100.0 fL   MCH 29.7 26.0 - 34.0 pg   MCHC 34.9 30.0 - 36.0 g/dL   RDW 15.2 11.5 - 15.5 %  Platelets 65 (L) 150 - 400 K/uL   nRBC 0.3 (H) 0.0 - 0.2 %  Magnesium     Status: None   Collection Time: 02/24/19 12:47 AM  Result Value Ref Range   Magnesium 1.8 1.7 - 2.4 mg/dL  Phosphorus     Status: None   Collection Time: 02/24/19 12:47 AM  Result Value Ref Range   Phosphorus 2.6 2.5 - 4.6 mg/dL  Basic metabolic panel     Status: Abnormal   Collection Time: 02/24/19 12:47 AM  Result Value Ref Range   Sodium 146 (H) 135 - 145 mmol/L   Potassium 3.6 3.5 - 5.1 mmol/L   Chloride 112 (H) 98 - 111 mmol/L   CO2 29 22 - 32 mmol/L   Glucose, Bld 237 (H) 70 - 99 mg/dL   BUN 41 (H) 6 - 20 mg/dL   Creatinine, Ser 2.63 (H) 0.44 - 1.00 mg/dL   Calcium 6.4 (LL) 8.9 - 10.3 mg/dL   GFR calc non Af Amer 19 (L) >60 mL/min   GFR calc Af Amer 22 (L) >60 mL/min   Anion gap 5 5 - 15  Glucose, capillary     Status: Abnormal   Collection Time: 02/24/19  3:06 AM  Result Value  Ref Range   Glucose-Capillary 221 (H) 70 - 99 mg/dL  Basic metabolic panel     Status: Abnormal   Collection Time: 02/24/19  4:53 AM  Result Value Ref Range   Sodium 146 (H) 135 - 145 mmol/L   Potassium 3.3 (L) 3.5 - 5.1 mmol/L   Chloride 112 (H) 98 - 111 mmol/L   CO2 31 22 - 32 mmol/L   Glucose, Bld 214 (H) 70 - 99 mg/dL   BUN 40 (H) 6 - 20 mg/dL   Creatinine, Ser 2.39 (H) 0.44 - 1.00 mg/dL   Calcium 6.5 (L) 8.9 - 10.3 mg/dL   GFR calc non Af Amer 22 (L) >60 mL/min   GFR calc Af Amer 25 (L) >60 mL/min   Anion gap 3 (L) 5 - 15  Magnesium     Status: None   Collection Time: 02/24/19  4:53 AM  Result Value Ref Range   Magnesium 1.8 1.7 - 2.4 mg/dL  Phosphorus     Status: None   Collection Time: 02/24/19  4:53 AM  Result Value Ref Range   Phosphorus 2.5 2.5 - 4.6 mg/dL  CBC     Status: Abnormal   Collection Time: 02/24/19  4:53 AM  Result Value Ref Range   WBC 17.7 (H) 4.0 - 10.5 K/uL   RBC 2.18 (L) 3.87 - 5.11 MIL/uL   Hemoglobin 6.5 (L) 12.0 - 15.0 g/dL   HCT 18.7 (L) 36.0 - 46.0 %   MCV 85.8 80.0 - 100.0 fL   MCH 29.8 26.0 - 34.0 pg   MCHC 34.8 30.0 - 36.0 g/dL   RDW 15.3 11.5 - 15.5 %   Platelets 70 (L) 150 - 400 K/uL   nRBC 0.2 0.0 - 0.2 %  Prepare RBC     Status: None   Collection Time: 02/24/19  5:33 AM  Result Value Ref Range   Order Confirmation      ORDER PROCESSED BY BLOOD BANK Performed at [facility], [address] [place], [place] [address]   Glucose, capillary     Status: Abnormal   Collection Time: 02/24/19  7:21 AM  Result Value Ref Range   Glucose-Capillary 135 (H) 70 - 99 mg/dL  Hemoglobin and  hematocrit, blood     Status: Abnormal   Collection Time: 02/24/19  9:05 AM  Result Value Ref Range   Hemoglobin 8.5 (L) 12.0 - 15.0 g/dL   HCT 23.9 (L) 36.0 - 46.0 %  Glucose, capillary     Status: None   Collection Time: 02/24/19 12:09 PM  Result Value Ref Range   Glucose-Capillary 98 70 - 99 mg/dL     Recent Labs    02/23/19 0533   02/24/19 0047 02/24/19 0453 02/24/19 0905  WBC 23.8*  --  18.1* 17.7*  --   HGB 7.9*   < > 6.6* 6.5* 8.5*  HCT 22.0*   < > 18.9* 18.7* 23.9*  PLT 95*  --  65* 70*  --    < > = values in this interval not displayed.   BMET Recent Labs    02/23/19 1541 02/24/19 0047 02/24/19 0453  NA 150* 146* 146*  K 3.6 3.6 3.3*  CL 113* 112* 112*  CO2 31 29 31   GLUCOSE 158* 237* 214*  BUN 46* 41* 40*  CREATININE 2.67* 2.63* 2.39*  CALCIUM 6.6* 6.4* 6.5*   LFT Recent Labs    02/22/19 0424 02/23/19 0533  PROT 3.5*  --   ALBUMIN 1.9* 1.7*  AST 93*  --   ALT 43  --   ALKPHOS 39  --   BILITOT 1.0  --    PT/INR No results for input(s): LABPROT, INR in the last 72 hours. Hepatitis Panel No results for input(s): HEPBSAG, HCVAB, HEPAIGM, HEPBIGM in the last 72 hours. C-Diff No results for input(s): CDIFFTOX in the last 72 hours. No results for input(s): CDIFFPCR in the last 72 hours.   Studies/Results: No results found.  Scheduled Inpatient Medications:   . chlorhexidine gluconate (MEDLINE KIT)  15 mL Mouth Rinse BID  . folic acid  1 mg Per Tube Daily  . hydrocortisone sod succinate (SOLU-CORTEF) inj  50 mg Intravenous Q12H  . insulin aspart  0-9 Units Subcutaneous Q4H  . insulin glargine  6 Units Subcutaneous Daily  . mouth rinse  15 mL Mouth Rinse 10 times per day  . multivitamin  15 mL Per Tube Daily  . pantoprazole (PROTONIX) IV  40 mg Intravenous Q12H  . QUEtiapine  25 mg Per Tube QHS  . sodium chloride flush  10-40 mL Intracatheter Q12H  . thiamine  100 mg Per Tube Daily    Continuous Inpatient Infusions:   . sodium chloride Stopped (02/22/19 1223)  . ampicillin-sulbactam (UNASYN) IV 3 g (02/24/19 1241)  . dextrose 5 % with KCl 20 mEq / L 20 mEq (02/24/19 1403)  . erythromycin 200 mg (02/24/19 1406)  . feeding supplement (VITAL AF 1.2 CAL) 1,000 mL (02/23/19 1142)  . fentaNYL infusion INTRAVENOUS 50 mcg/hr (02/24/19 0800)    PRN Inpatient Medications:  sodium  chloride, fentaNYL, midazolam, sodium chloride flush   Assessment:  1. Recurrent GI bleeding.  2. Anemia secondary to GI blood loss. 3. Respiratory failure, on ventilatory support. W/ aspiration pneumonia. 4. DKA  Plan:  1. Repeat EGD. I discussed the procedure again with the patient's NOK and daughter, Linus Orn. The patient's daughter understands the nature of the planned procedure, indications, risks, alternatives and potential complications including but not limited to bleeding, infection, perforation, damage to internal organs and possible oversedation/side effects from anesthesia. The patient's daughter agrees and gives consent to proceed.  Please refer to procedure notes for findings, recommendations and patient disposition/instructions.   K. Alice Reichert, M.D.  02/24/2019, 3:00 PM

## 2019-02-24 NOTE — Progress Notes (Signed)
CRITICAL CARE NOTE  CC  follow up respiratory failure  SUBJECTIVE Patient remains critically ill Prognosis is guarded Full vent support HGB dropped again Still with GIB-giving blood transfusions     SIGNIFICANT EVENTS 4/22 admitted for DKA nad severe hemmorhagic shock 4/22 intubated, aspiration of gastric contents and blood #8 ETT and LEFT FEM LINE PLACED 4/22 severe small NON bleeding doudenal ulcers s/p EGD 4/23 remains on vasopressors, multiorgan failure 4/24 remains on vasopressors, multiorgan failure  REVIEW OF SYSTEMS  PATIENT IS UNABLE TO PROVIDE COMPLETE REVIEW OF SYSTEMS DUE TO SEVERE CRITICAL ILLNESS     BP (!) 87/54   Pulse 67   Temp 98.3 F (36.8 C) (Oral)   Resp (!) 21   Ht 4' 9"  (1.448 m)   Wt 48.6 kg   SpO2 100%   BMI 23.19 kg/m    Vent Mode: PRVC FiO2 (%):  [40 %] 40 % Set Rate:  [18 bmp] 18 bmp Vt Set:  [400 mL] 400 mL PEEP:  [5 cmH20] 5 cmH20 Plateau Pressure:  [19 cmH20] 19 cmH20    PHYSICAL EXAMINATION:  GENERAL:critically ill appearing, +resp distress HEAD: Normocephalic, atraumatic.  EYES: Pupils equal, round, reactive to light.  No scleral icterus.  MOUTH: Moist mucosal membrane. NECK: Supple. No thyromegaly. No nodules. No JVD.  PULMONARY: +rhonchi, +wheezing CARDIOVASCULAR: S1 and S2. Regular rate and rhythm. No murmurs, rubs, or gallops.  GASTROINTESTINAL: Soft, nontender, -distended. No masses. Positive bowel sounds. No hepatosplenomegaly.  MUSCULOSKELETAL: No swelling, clubbing, or edema.  NEUROLOGIC: obtunded, GCS<8 SKIN:intact,warm,dry    COVID-19 NEGATIVE: Acute COVID-19 infection ruled out by PCR. Consider antibody titers when test available.    CULTURE RESULTS   Recent Results (from the past 240 hour(s))  MRSA PCR Screening     Status: None   Collection Time: 02/20/19 10:07 PM  Result Value Ref Range Status   MRSA by PCR NEGATIVE NEGATIVE Final    Comment:        The GeneXpert MRSA Assay (FDA approved for  NASAL specimens only), is one component of a comprehensive MRSA colonization surveillance program. It is not intended to diagnose MRSA infection nor to guide or monitor treatment for MRSA infections. Performed at [facility], [address], [place], [place] [address]   Urine Culture     Status: None   Collection Time: 02/21/19  1:44 AM  Result Value Ref Range Status   Specimen Description   Final    URINE, RANDOM Performed at [facility], [address], [place], [place] [address]    Special Requests   Final    NONE Performed at [facility], [address] [place], [place] [address]    Culture   Final    NO GROWTH Performed at [facility], [place] [address], [place], [place] [address]    Report Status 02/22/2019 FINAL  Final  CULTURE, BLOOD (ROUTINE X 2) w Reflex to ID Panel     Status: None (Preliminary result)   Collection Time: 02/21/19  2:19 AM  Result Value Ref Range Status   Specimen Description BLOOD BLOOD LEFT FOREARM  Final   Special Requests   Final    BOTTLES DRAWN AEROBIC ONLY Blood Culture results may not be optimal due to an inadequate volume of blood received in culture bottles   Culture   Final    NO GROWTH 3 DAYS Performed at [facility], [address], [place], [place] [address]    Report Status PENDING  Incomplete  CULTURE, BLOOD (ROUTINE X 2) w Reflex to ID Panel     Status: None (Preliminary result)   Collection Time: 02/21/19  2:19 AM  Result Value Ref Range Status   Specimen Description BLOOD BLOOD LEFT WRIST  Final   Special Requests   Final    BOTTLES DRAWN AEROBIC ONLY Blood Culture results may not be optimal due to an inadequate volume of blood received in culture bottles   Culture   Final    NO GROWTH 3 DAYS Performed at [facility], [address] [place], [place] [address]    Report Status PENDING  Incomplete        CBC    Component Value Date/Time   WBC 17.7 (H)  02/24/2019 0453   RBC 2.18 (L) 02/24/2019 0453   HGB 6.5 (L) 02/24/2019 0453   HCT 18.7 (L) 02/24/2019 0453   PLT 70 (L) 02/24/2019 0453   MCV 85.8 02/24/2019 0453   MCH 29.8 02/24/2019 0453   MCHC 34.8 02/24/2019 0453   RDW 15.3 02/24/2019 0453   LYMPHSABS 3.3 02/22/2019 1003   MONOABS 0.3 02/22/2019 1003   EOSABS 0.3 02/22/2019 1003   BASOSABS 0.1 02/22/2019 1003    BMP Latest Ref Rng & Units 02/24/2019 02/24/2019 02/23/2019  Glucose 70 - 99 mg/dL 214(H) 237(H) 158(H)  BUN 6 - 20 mg/dL 40(H) 41(H) 46(H)  Creatinine 0.44 - 1.00 mg/dL 2.39(H) 2.63(H) 2.67(H)  Sodium 135 - 145 mmol/L 146(H) 146(H) 150(H)  Potassium 3.5 - 5.1 mmol/L 3.3(L) 3.6 3.6  Chloride 98 - 111 mmol/L 112(H) 112(H) 113(H)  CO2 22 - 32 mmol/L 31 29 31   Calcium 8.9 - 10.3 mg/dL 6.5(L) 6.4(LL) 6.6(L)        Indwelling Urinary Catheter continued, requirement due to   Reason to continue Indwelling Urinary Catheter for strict Intake/Output monitoring for hemodynamic instability   Central Line continued, requirement due to   Reason to continue [employer] Monitoring of central venous pressure or other hemodynamic parameters   Ventilator continued, requirement due to, resp failure    Ventilator Sedation RASS 0 to -2     ASSESSMENT AND PLAN SYNOPSIS  Severe ACUTE Hypoxic and Hypercapnic Respiratory Failures/p aspiration of blood/gastric contents from acute GIB with acute encephalopathy from acidosis and severe hemorrhagic shock   Severe ACUTE Hypoxic and Hypercapnic Respiratory Failure -continue Full MV support -continue Bronchodilator Therapy -Wean Fio2 and PEEP as tolerated -will perform SAT/SBT when respiratory parameters are met  ACUTE KIDNEY INJURY/Renal Failure -follow chem 7 -follow UO -continue Foley Catheter-assess need   NEUROLOGY - intubated and sedated - minimal sedation to achieve a RASS goal: -1 Wake up assessment pending   HYPOVOLUMIC SHOCK -use vasopressors to keep  MAP>65 -follow ABG and LA -follow up cultures -consider stress dose steroids -aggressive IV fluid resuscitation  CARDIAC ICU monitoring   ACUTE GIB-several NON bleeding duodenal ulcers Protonix BID Follow up Gi Recs Dr Alice Reichert notified of active GIB   GI/Nutrition DIET-->TF on hold Constipation protocol as indicated  ENDO - ICU hypoglycemic\Hyperglycemia protocol -check FSBS per protocol   ELECTROLYTES -follow labs as needed -replace as needed -pharmacy consultation and following   DVT/GI PRX ordered TRANSFUSIONS AS NEEDED MONITOR FSBS ASSESS the need for LABS as needed     COVID-19 NEGATIVE: Acute COVID-19 infection ruled out by PCR. Consider antibody titers when test available.    Critical Care Time devoted to patient care services described in this note is 36  minutes.   Overall, patient  is critically ill, prognosis is guarded.  Patient with Multiorgan failure and at high risk for cardiac arrest and death.    Corrin Parker, M.D.  [facility]  Medical Director [place] Director [facility] Cardio-Pulmonary Department

## 2019-02-24 NOTE — Op Note (Signed)
[facility] Gastroenterology Patient Name: Bethany James Procedure Date: 02/24/2019 3:26 PM MRN: [id number] Account #: [id number] Date of Birth: 1961-04-24 Admit Type: Inpatient Age: 58 Room: ICU Bed 19 Gender: Female Note Status: Finalized Procedure:            Upper GI endoscopy Indications:          Iron deficiency anemia secondary to chronic blood loss,                        Melena, This is a repeat procedure. Last EGD was                        performed on 02/22/2019. This is a repeat endoscopy for                        recurrent bleeding. The initial therapeutic endoscopy                        was performed on 02/21/2019. Providers:            Boykin Nearing. Norma Fredrickson MD, MD Referring MD:         No Local Md, MD (Referring MD) Medicines:            Propofol drip administered by Intensivist, DR. Belia Heman. Complications:        No immediate complications. Procedure:            Pre-Anesthesia Assessment:                       - The risks and benefits of the procedure and the                        sedation options and risks were discussed with the                        patient. All questions were answered and informed                        consent was obtained.                       - Patient identification and proposed procedure were                        verified prior to the procedure by the nurse. The                        procedure was verified in the procedure room.                       - ASA Grade Assessment: III - A patient with severe                        systemic disease.                       - After reviewing the risks and benefits, the patient                        was deemed in satisfactory condition to undergo the  procedure.                       - Airway Examination: orotracheal intubation.                       After obtaining informed consent, the endoscope was                        passed under direct vision. Throughout  the procedure,                        the patient's blood pressure, pulse, and oxygen                        saturations were monitored continuously. The Endoscope                        was introduced through the mouth, and advanced to the                        third part of duodenum. The upper GI endoscopy was                        accomplished without difficulty. The patient tolerated                        the procedure well. Findings:      No gross lesions were noted in the entire esophagus.      One oozing linear gastric ulcer with pigmented material was found in the       cardia. The lesion was 15 mm in largest dimension. Area was successfully       injected with 2 mL of a 1:10,000 solution of epinephrine for hemostasis.       Coagulation for hemostasis using bipolar probe was successful. Estimated       blood loss was minimal.      A 1 cm hiatal hernia was present.      A deformity was found at the pylorus.      Two non-bleeding cratered duodenal ulcers with no stigmata of bleeding       were found in the second portion of the duodenum. The largest lesion was       6 mm in largest dimension.      The exam was otherwise without abnormality.      A single 7 mm pedunculated polyp with no bleeding and no stigmata of       recent bleeding was found in the gastric body. Polypectomy was not       attempted due to recent GI bleeding in alternate UGI location. Impression:           - No gross lesions in esophagus.                       - Oozing gastric ulcer with pigmented material.                        Injected. Treated with bipolar cautery.                       - 1 cm hiatal hernia.                       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                       - Non-bleeding duodenal ulcers with no stigmata of                        bleeding.                       - The examination was otherwise normal.                       - No specimens collected. Recommendation:       -  Return patient to ICU for ongoing care. Procedure Code(s):    --- Professional ---                       [phone], Esophagogastroduodenoscopy, flexible, transoral;                        with control of bleeding, any method Diagnosis Code(s):    --- Professional ---                       K92.1, Melena (includes Hematochezia)                       D50.0, Iron deficiency anemia secondary to blood loss                        (chronic)                       K26.9, Duodenal ulcer, unspecified as acute or chronic,                        without hemorrhage or perforation                       K31.89, Other diseases of stomach and duodenum                       K44.9, Diaphragmatic hernia without obstruction or                        gangrene                       K25.4, Chronic or unspecified gastric ulcer with                        hemorrhage CPT copyright 2019 [employer]. All rights reserved. The codes documented in this report are preliminary and upon coder review may  be revised to meet current compliance requirements. Stanton Kidney MD, MD 02/24/2019 4:57:35 PM This report has been signed electronically. Number of Addenda: 0 Note Initiated On: 02/24/2019 3:26 PM      [facility]

## 2019-02-24 NOTE — Progress Notes (Signed)
[place] at [facility]                                                                                                                                                                                  Patient Demographics   Bethany James, is a 58 y.o. female, DOB - [birth year], TGG:[id number]  Admit date - 02/20/2019   Admitting Physician Lance Coon, MD  Outpatient Primary MD for the patient is System, Pcp Not In   LOS - 4  Subjective: Patient remains intubated, sedated.  Hemoglobin dropped again, getting blood transfusion.   Review of Systems:   CONSTITUTIONAL: Intubated Vitals:   Vitals:   02/24/19 0900 02/24/19 1000 02/24/19 1100 02/24/19 1200  BP:  (!) 88/58 (!) 85/57 106/64  Pulse: 68 63 62 67  Resp: 18 18 19 18   Temp:    98.5 F (36.9 C)  TempSrc:    Oral  SpO2: 100% 100% 100% 100%  Weight:      Height:        Wt Readings from Last 3 Encounters:  02/20/19 48.6 kg  08/13/18 59 kg  04/06/18 71.2 kg     Intake/Output Summary (Last 24 hours) at 02/24/2019 1324 Last data filed at 02/24/2019 1200 Gross per 24 hour  Intake 4148.72 ml  Output 2320 ml  Net 1828.72 ml    Physical Exam:   GENERAL: Critically ill-appearing.  Intubated HEAD, EYES, EARS, NOSE AND THROAT: Atraumatic, normocephalic. . Pupils equal and reactive to light. Sclerae anicteric. No conjunctival injection. No oro-pharyngeal erythema.  NECK: Supple. There is no jugular venous distention. No bruits, no lymphadenopathy, no thyromegaly.  HEART: Regular rate and rhythm,. No murmurs, no rubs, no clicks.  LUNGS: Clear to auscultation bilaterally. No rales or rhonchi. No wheezes.  ABDOMEN: Soft, flat, nontender, nondistended. Has good bowel sounds. No hepatosplenomegaly appreciated.  EXTREMITIES: No evidence of any cyanosis, clubbing, or peripheral edema.  +2 pedal and radial pulses bilaterally.  NEUROLOGIC: Sedated SKIN: Moist and warm with no rashes appreciated.  Psych: Not  anxious, depressed LN: No inguinal LN enlargement    Antibiotics   Anti-infectives (From admission, onward)   Start     Dose/Rate Route Frequency Ordered Stop   02/23/19 1400  erythromycin 200 mg in sodium chloride 0.9 % 100 mL IVPB     200 mg 100 mL/hr over 60 Minutes Intravenous Every 8 hours 02/23/19 1041 02/26/19 0559   02/23/19 1200  Ampicillin-Sulbactam (UNASYN) 3 g in sodium chloride 0.9 % 100 mL IVPB     3 g 200 mL/hr over 30 Minutes Intravenous 2 times daily 02/23/19 1037 02/26/19 0959   02/21/19 1930  metroNIDAZOLE (FLAGYL)  IVPB 500 mg  Status:  Discontinued     500 mg 100 mL/hr over 60 Minutes Intravenous Every 6 hours 02/21/19 1923 02/23/19 1037   02/21/19 1400  cefTRIAXone (ROCEPHIN) 2 g in sodium chloride 0.9 % 100 mL IVPB  Status:  Discontinued     2 g 200 mL/hr over 30 Minutes Intravenous Daily 02/21/19 1324 02/23/19 1037   02/21/19 1400  erythromycin 250 mg in sodium chloride 0.9 % 100 mL IVPB     250 mg 100 mL/hr over 60 Minutes Intravenous STAT 02/21/19 1335 02/21/19 1538   02/21/19 0145  ceFEPIme (MAXIPIME) 2 g in sodium chloride 0.9 % 100 mL IVPB  Status:  Discontinued     2 g 200 mL/hr over 30 Minutes Intravenous Every 24 hours 02/21/19 0141 02/21/19 0652      Medications   Scheduled Meds: . chlorhexidine gluconate (MEDLINE KIT)  15 mL Mouth Rinse BID  . folic acid  1 mg Per Tube Daily  . hydrocortisone sod succinate (SOLU-CORTEF) inj  50 mg Intravenous Q12H  . insulin aspart  0-9 Units Subcutaneous Q4H  . insulin glargine  6 Units Subcutaneous Daily  . mouth rinse  15 mL Mouth Rinse 10 times per day  . multivitamin  15 mL Per Tube Daily  . pantoprazole (PROTONIX) IV  40 mg Intravenous Q12H  . QUEtiapine  25 mg Per Tube QHS  . sodium chloride flush  10-40 mL Intracatheter Q12H  . thiamine  100 mg Per Tube Daily   Continuous Infusions: . sodium chloride Stopped (02/22/19 1223)  . ampicillin-sulbactam (UNASYN) IV 3 g (02/24/19 1241)  . dextrose 5 %  with KCl 20 mEq / L 20 mEq (02/24/19 0309)  . erythromycin 200 mg (02/24/19 1108)  . feeding supplement (VITAL AF 1.2 CAL) 1,000 mL (02/23/19 1142)  . fentaNYL infusion INTRAVENOUS 50 mcg/hr (02/24/19 0800)   PRN Meds:.sodium chloride, fentaNYL, midazolam, sodium chloride flush   Data Review:   Micro Results Recent Results (from the past 240 hour(s))  MRSA PCR Screening     Status: None   Collection Time: 02/20/19 10:07 PM  Result Value Ref Range Status   MRSA by PCR NEGATIVE NEGATIVE Final    Comment:        The GeneXpert MRSA Assay (FDA approved for NASAL specimens only), is one component of a comprehensive MRSA colonization surveillance program. It is not intended to diagnose MRSA infection nor to guide or monitor treatment for MRSA infections. Performed at [facility], [address], [place], [place] [address]   Urine Culture     Status: None   Collection Time: 02/21/19  1:44 AM  Result Value Ref Range Status   Specimen Description   Final    URINE, RANDOM Performed at [facility], [address] [place],  [address]    Special Requests   Final    NONE Performed at [facility], [address] [place],  [address]    Culture   Final    NO GROWTH Performed at [facility], [place] [address], [place],  [address]    Report Status 02/22/2019 FINAL  Final  CULTURE, BLOOD (ROUTINE X 2) w Reflex to ID Panel     Status: None (Preliminary result)   Collection Time: 02/21/19  2:19 AM  Result Value Ref Range Status   Specimen Description BLOOD BLOOD LEFT FOREARM  Final   Special Requests   Final    BOTTLES DRAWN AEROBIC ONLY  Blood Culture results may not be optimal due to an inadequate volume of blood received in culture bottles   Culture   Final    NO GROWTH 3 DAYS Performed at [facility], [address] [place], [place] [address]    Report Status PENDING  Incomplete  CULTURE, BLOOD (ROUTINE X 2) w  Reflex to ID Panel     Status: None (Preliminary result)   Collection Time: 02/21/19  2:19 AM  Result Value Ref Range Status   Specimen Description BLOOD BLOOD LEFT WRIST  Final   Special Requests   Final    BOTTLES DRAWN AEROBIC ONLY Blood Culture results may not be optimal due to an inadequate volume of blood received in culture bottles   Culture   Final    NO GROWTH 3 DAYS Performed at [facility], [address] [place], [place] [address]    Report Status PENDING  Incomplete    Radiology Reports Dg Abd 1 View  Result Date: 02/21/2019 CLINICAL DATA:  NG tube placement EXAM: ABDOMEN - 1 VIEW COMPARISON:  02/21/2019 FINDINGS: Esophageal tube tip is in the left upper quadrant. Airspace disease at the left base. Mild diffuse gaseous dilatation of the bowel IMPRESSION: Esophageal tube tip is in the left upper quadrant of the abdomen, projecting over the proximal stomach Electronically Signed   By: Donavan Foil M.D.   On: 02/21/2019 22:38   Dg Abd 1 View  Result Date: 02/21/2019 CLINICAL DATA:  OG tube placement EXAM: ABDOMEN - 1 VIEW COMPARISON:  02/21/2019 FINDINGS: Esophageal tube is looped back upon itself over the distal esophagus. Diffuse mild air distention of the bowel. Infiltrate at the left base. IMPRESSION: Esophageal tube looped back upon itself over the distal esophagus. Electronically Signed   By: Donavan Foil M.D.   On: 02/21/2019 22:37   Dg Abd 1 View  Result Date: 02/21/2019 CLINICAL DATA:  Enteric tube placement. EXAM: ABDOMEN - 1 VIEW COMPARISON:  Abdominal x-ray from yesterday. FINDINGS: Enteric tube looped in the stomach with the tip near the gastric fundus. Left femoral approach central venous catheter with tip in the left common iliac vein. Paucity of bowel gas. No evidence of obstruction. Possible 3 mm right renal calculus. No acute osseous abnormality. IMPRESSION: 1. Enteric tube looped in the stomach. 2. Left femoral approach central venous catheter tip in  the left common iliac vein. 3. No acute findings. Electronically Signed   By: Titus Dubin M.D.   On: 02/21/2019 14:25   Dg Chest Port 1 View  Result Date: 02/22/2019 CLINICAL DATA:  58 year old female with respiratory failure. Aspiration of bloody gastric contents. EXAM: PORTABLE CHEST 1 VIEW COMPARISON:  420 through 20 and earlier. FINDINGS: Stable endotracheal tube tip in good position. Enteric tube courses to the midline, tip not included. Mediastinal contours remain normal. Mildly lower lung volumes. Confluent bilateral perihilar and lower lung airspace opacity with retrocardiac air bronchograms. Increased left lower lobe consolidation since yesterday with some obscuration of the left hemidiaphragm. No superimposed pneumothorax. No pleural effusion is evident. Upper lung pulmonary vascularity appears normal. IMPRESSION: 1. Stable lines and tubes. 2. Confluent bilateral perihilar and lower lung pneumonia with increased left lower lobe consolidation since yesterday. Electronically Signed   By: Genevie Ann M.D.   On: 02/22/2019 05:51   Dg Chest Port 1 View  Result Date: 02/21/2019 CLINICAL DATA:  Hematemesis. EXAM: PORTABLE CHEST 1 VIEW COMPARISON:  Chest x-ray from same day at 7:16. FINDINGS: Interval placement of an  endotracheal tube with the tip 2.0 cm above the carina. Enteric tube coiled within the stomach. The heart size and mediastinal contours are within normal limits. Normal pulmonary vascularity. New patchy perihilar and bilateral lower lobe consolidations. No pleural effusion or pneumothorax. No acute osseous abnormality. IMPRESSION: 1. Appropriately positioned endotracheal and enteric tubes. 2. New patchy perihilar and bilateral lower lobe consolidations, concerning for aspiration. Electronically Signed   By: Titus Dubin M.D.   On: 02/21/2019 14:22   Dg Chest Port 1 View  Result Date: 02/21/2019 CLINICAL DATA:  Leukocytosis. EXAM: PORTABLE CHEST 1 VIEW COMPARISON:  Radiographs of February 20, 2019. FINDINGS: The heart size and mediastinal contours are within normal limits. Both lungs are clear. The visualized skeletal structures are unremarkable. IMPRESSION: No active disease. Electronically Signed   By: Marijo Conception M.D.   On: 02/21/2019 08:17   Dg Abdomen Acute W/chest  Result Date: 02/20/2019 CLINICAL DATA:  Abdominal pain EXAM: DG ABDOMEN ACUTE W/ 1V CHEST COMPARISON:  None. FINDINGS: Moderate stool burden in the colon. The bowel gas pattern is normal. There is no evidence of free intraperitoneal air. No suspicious radio-opaque calculi or other significant radiographic abnormality is seen. Heart size and mediastinal contours are within normal limits. Both lungs are clear. IMPRESSION: Moderate stool burden.  No acute findings. Electronically Signed   By: Rolm Baptise M.D.   On: 02/20/2019 20:05     CBC Recent Labs  Lab 02/21/19 0551 02/21/19 2156  02/22/19 1003  02/23/19 0533 02/23/19 1541 02/23/19 1921 02/24/19 0047 02/24/19 0453 02/24/19 0905  WBC 21.6* 15.3*  --  23.4*  --  23.8*  --   --  18.1* 17.7*  --   HGB 8.5* 10.3*   < > 10.0*   < > 7.9* 7.1* 7.1* 6.6* 6.5* 8.5*  HCT 26.4* 28.6*   < > 27.2*   < > 22.0* 20.7* 19.9* 18.9* 18.7* 23.9*  PLT 298 144*  --  146*  --  95*  --   --  65* 70*  --   MCV 92.3 82.4  --  81.2  --  82.4  --   --  85.1 85.8  --   MCH 29.7 29.7  --  29.9  --  29.6  --   --  29.7 29.8  --   MCHC 32.2 36.0  --  36.8*  --  35.9  --   --  34.9 34.8  --   RDW 13.1 13.1  --  14.1  --  14.9  --   --  15.2 15.3  --   LYMPHSABS 2.5  --   --  3.3  --   --   --   --   --   --   --   MONOABS 1.0  --   --  0.3  --   --   --   --   --   --   --   EOSABS 0.0  --   --  0.3  --   --   --   --   --   --   --   BASOSABS 0.0  --   --  0.1  --   --   --   --   --   --   --    < > = values in this interval not displayed.    [place]  Lab 02/20/19 1913  02/21/19 1744 02/22/19 0424 02/22/19 1649 02/23/19 0533  02/23/19 1541  02/24/19 0047 02/24/19 0453  NA 135   < > 164* 158* 155* 152* 150* 146* 146*  K 6.5*   < > 2.6* 3.7 3.3* 3.9 3.6 3.6 3.3*  CL 98   < > 120* 116* 119* 116* 113* 112* 112*  CO2 9*   < > 28 30 29 29 31 29 31   GLUCOSE 866*   < > 195* 340* 78 178* 158* 237* 214*  BUN 34*   < > 52* 56* 56* 51* 46* 41* 40*  CREATININE 2.76*   < > 1.93* 2.30* 2.60* 2.71* 2.67* 2.63* 2.39*  CALCIUM 9.2   < > 6.8* 6.2* 6.2* 6.3* 6.6* 6.4* 6.5*  MG  --    < > 1.4* 1.4*  --  2.1  --  1.8 1.8  AST 18  --   --  93*  --   --   --   --   --   ALT 11  --   --  43  --   --   --   --   --   ALKPHOS 113  --   --  39  --   --   --   --   --   BILITOT 1.1  --   --  1.0  --   --   --   --   --    < > = values in this interval not displayed.   ------------------------------------------------------------------------------------------------------------------ estimated creatinine clearance is 17.5 mL/min (A) (by C-G formula based on SCr of 2.39 mg/dL (H)). ------------------------------------------------------------------------------------------------------------------ No results for input(s): HGBA1C in the last 72 hours. ------------------------------------------------------------------------------------------------------------------ No results for input(s): CHOL, HDL, LDLCALC, TRIG, CHOLHDL, LDLDIRECT in the last 72 hours. ------------------------------------------------------------------------------------------------------------------ No results for input(s): TSH, T4TOTAL, T3FREE, THYROIDAB in the last 72 hours.  Invalid input(s): FREET3 ------------------------------------------------------------------------------------------------------------------ No results for input(s): VITAMINB12, FOLATE, FERRITIN, TIBC, IRON, RETICCTPCT in the last 72 hours.  Coagulation profile Recent Labs  Lab 02/21/19 0713  INR 1.5*    No results for input(s): DDIMER in the last 72 hours.  Cardiac Enzymes Recent Labs  Lab 02/21/19 2156  02/22/19 0424 02/22/19 1003  TROPONINI 0.26* 0.46* 0.42*   ------------------------------------------------------------------------------------------------------------------ Invalid input(s): POCBNP    Assessment & Plan  Patient is 58 year old admitted with nausea and vomiting  #1 DKA (diabetic ketoacidoses) ([place]) -resolved continue Lantus and sliding scale insulin   #2. upper GI bleed status post EGD showing severe small nonbleeding duodenal ulcers, continue PPIs. #3 hypotension related to likely upper GI bleed status post transfusion continue pressors wean as tolerated Continue stress dose steroids and antibiotics  #4 AKI (acute kidney injury) ([place])  Currently stable no significant change compared to yesterday  #5 leukocytosis possible reactive better slightly today  #6. anemia due to upper GI bleed IMA globin dropped again to 6.5 today, transfuse 1  Unit packed RBC, 7. hypokalemia, replace potassium.\  #8. hypovolemic shock, use vasopressors, stress dose steroids, and continue IV fluids, n.p.o., Dr. tolerated has been notified by intensivist regarding ongoing blood loss  miscellaneous SCDs for DVT prophylaxis COVID-19 has been ruled out.     Code Status Orders  (From admission, onward)         Start     Ordered   02/20/19 2207  Full code  Continuous     02/20/19 2206        Code Status History    This patient has a current code status but no historical code status.  Advance Directive Documentation     Most Recent Value  Type of Advance Directive  Healthcare Power of Attorney [Daughter- Tasha]  Pre-existing out of facility DNR order (yellow form or pink MOST form)  -  "MOST" Form in Place?  -           Consults pulm ccm  DVT Prophylaxis scd's  Lab Results  Component Value Date   PLT 70 (L) 02/24/2019     Time Spent in minutes  19mn spent   SEpifanio LeschesM.D on 02/24/2019 at 1:24 PM  Between 7am to 6pm - Pager -  [phone]  After 6pm go to [email or web address] - [employer]   Office  [phone]

## 2019-02-24 NOTE — Progress Notes (Signed)
Hgb 6.6 NP Tukov notified.

## 2019-02-25 LAB — BPAM RBC
Blood Product Expiration Date: 202004242359
Blood Product Expiration Date: 202004242359
Blood Product Expiration Date: 202004272359
ISSUE DATE / TIME: 202004221624
ISSUE DATE / TIME: 202004221845
ISSUE DATE / TIME: 202004250609
Unit Type and Rh: 5100
Unit Type and Rh: 5100
Unit Type and Rh: 5100

## 2019-02-25 LAB — TYPE AND SCREEN
ABO/RH(D): O POS
Antibody Screen: NEGATIVE
Unit division: 0
Unit division: 0
Unit division: 0

## 2019-02-25 LAB — CBC
HCT: 22.9 % — ABNORMAL LOW (ref 36.0–46.0)
HCT: 26.9 % — ABNORMAL LOW (ref 36.0–46.0)
Hemoglobin: 8.1 g/dL — ABNORMAL LOW (ref 12.0–15.0)
Hemoglobin: 9.2 g/dL — ABNORMAL LOW (ref 12.0–15.0)
MCH: 30.6 pg (ref 26.0–34.0)
MCH: 29.9 pg (ref 26.0–34.0)
MCHC: 35.4 g/dL (ref 30.0–36.0)
MCHC: 34.2 g/dL (ref 30.0–36.0)
MCV: 86.4 fL (ref 80.0–100.0)
MCV: 87.3 fL (ref 80.0–100.0)
Platelets: 81 10*3/uL — ABNORMAL LOW (ref 150–400)
Platelets: 106 10*3/uL — ABNORMAL LOW (ref 150–400)
RBC: 2.65 MIL/uL — ABNORMAL LOW (ref 3.87–5.11)
RBC: 3.08 MIL/uL — ABNORMAL LOW (ref 3.87–5.11)
RDW: 14.6 % (ref 11.5–15.5)
RDW: 14.5 % (ref 11.5–15.5)
WBC: 24 10*3/uL — ABNORMAL HIGH (ref 4.0–10.5)
WBC: 24.1 10*3/uL — ABNORMAL HIGH (ref 4.0–10.5)
nRBC: 0.2 % (ref 0.0–0.2)
nRBC: 0.1 % (ref 0.0–0.2)

## 2019-02-25 LAB — BASIC METABOLIC PANEL
Anion gap: 4 — ABNORMAL LOW (ref 5–15)
BUN: 29 mg/dL — ABNORMAL HIGH (ref 6–20)
CO2: 31 mmol/L (ref 22–32)
Calcium: 7.3 mg/dL — ABNORMAL LOW (ref 8.9–10.3)
Chloride: 110 mmol/L (ref 98–111)
Creatinine, Ser: 2.15 mg/dL — ABNORMAL HIGH (ref 0.44–1.00)
GFR calc Af Amer: 29 mL/min — ABNORMAL LOW (ref >60)
GFR calc non Af Amer: 25 mL/min — ABNORMAL LOW (ref >60)
Glucose, Bld: 150 mg/dL — ABNORMAL HIGH (ref 70–99)
Potassium: 3.8 mmol/L (ref 3.5–5.1)
Sodium: 145 mmol/L (ref 135–145)

## 2019-02-25 LAB — GLUCOSE, CAPILLARY
Glucose-Capillary: 101 mg/dL — ABNORMAL HIGH (ref 70–99)
Glucose-Capillary: 114 mg/dL — ABNORMAL HIGH (ref 70–99)
Glucose-Capillary: 132 mg/dL — ABNORMAL HIGH (ref 70–99)
Glucose-Capillary: 139 mg/dL — ABNORMAL HIGH (ref 70–99)
Glucose-Capillary: 85 mg/dL (ref 70–99)
Glucose-Capillary: 99 mg/dL (ref 70–99)

## 2019-02-25 MED ORDER — METRONIDAZOLE IN NACL 5-0.79 MG/ML-% IV SOLN
500.0000 mg | Freq: Three times a day (TID) | INTRAVENOUS | Status: DC
  Administered 2019-02-25: 500 mg via INTRAVENOUS
  Filled 2019-02-25 (×2): qty 100

## 2019-02-25 MED ORDER — ORAL CARE MOUTH RINSE
15.0000 mL | Freq: Two times a day (BID) | OROMUCOSAL | Status: DC
  Administered 2019-02-25 – 2019-02-28 (×6): 15 mL via OROMUCOSAL

## 2019-02-25 MED ORDER — METRONIDAZOLE IN NACL 5-0.79 MG/ML-% IV SOLN
500.0000 mg | Freq: Three times a day (TID) | INTRAVENOUS | Status: DC
  Administered 2019-02-25 – 2019-02-27 (×7): 500 mg via INTRAVENOUS
  Filled 2019-02-25 (×9): qty 100

## 2019-02-25 MED ORDER — POTASSIUM CHLORIDE 10 MEQ/100ML IV SOLN
10.0000 meq | INTRAVENOUS | Status: AC
  Administered 2019-02-25 (×4): 10 meq via INTRAVENOUS
  Filled 2019-02-25 (×4): qty 100

## 2019-02-25 MED ORDER — POTASSIUM CHLORIDE 20 MEQ PO PACK: 60.0000 meq | PACK | Freq: Once | ORAL | Status: DC

## 2019-02-25 NOTE — Progress Notes (Signed)
Cuff leak noted  Patient extubated to [place] 4lpm @ 0915.  Patient tolerated well.  Saturations 97%.

## 2019-02-25 NOTE — Progress Notes (Addendum)
[facility]                                                                                                                                                                                  Patient Demographics   Bethany James, is a 58 y.o. female, DOB - 1961-04-11, ZDG:[id number]  Admit date - 02/20/2019   Admitting Physician Oralia Manis, MD  Outpatient Primary MD for the patient is System, Pcp Not In   LOS - 5  Subjective: extubated, on 4 L of oxygen, so far she is doing good..   Review of Systems:   CONSTITUTIONAL: Intubated Vitals:   Vitals:   02/25/19 0900 02/25/19 1000 02/25/19 1100 02/25/19 1200  BP: 134/73 119/68 118/75 121/67  Pulse: 74 80 88 65  Resp: 20 20 (!) 31 15  Temp:    98.2 F (36.8 C)  TempSrc:    Oral  SpO2: 100% 97% 99% 95%  Weight:      Height:        Wt Readings from Last 3 Encounters:  02/20/19 48.6 kg  08/13/18 59 kg  04/06/18 71.2 kg     Intake/Output Summary (Last 24 hours) at 02/25/2019 1351 Last data filed at 02/25/2019 1200 Gross per 24 hour  Intake 4619.06 ml  Output 2600 ml  Net 2019.06 ml    Physical Exam:   GENERAL: Critically ill-appearing.  Extubated HEAD, EYES, EARS, NOSE AND THROAT: Atraumatic, normocephalic. . Pupils equal and reactive to light. Sclerae anicteric. No conjunctival injection. No oro-pharyngeal erythema.  NECK: Supple. There is no jugular venous distention. No bruits, no lymphadenopathy, no thyromegaly.  HEART: Regular rate and rhythm,. No murmurs, no rubs, no clicks.  LUNGS: Clear to auscultation bilaterally. No rales or rhonchi. No wheezes.  ABDOMEN: Soft, flat, nontender, nondistended. Has good bowel sounds. No hepatosplenomegaly appreciated.  EXTREMITIES: No evidence of any cyanosis, clubbing, or peripheral edema.  +2 pedal and radial pulses bilaterally.  NEUROLOGIC: Alert, slow to respond, no gross neurological deficit is observed. SKIN: Moist and warm with no rashes  appreciated.  Psych: Not anxious, depressed LN: No inguinal LN enlargement    Antibiotics   Anti-infectives (From admission, onward)   Start     Dose/Rate Route Frequency Ordered Stop   02/25/19 1015  metroNIDAZOLE (FLAGYL) IVPB 500 mg     500 mg 100 mL/hr over 60 Minutes Intravenous Every 8 hours 02/25/19 0436     02/25/19 0200  metroNIDAZOLE (FLAGYL) IVPB 500 mg  Status:  Discontinued     500 mg 100 mL/hr over 60 Minutes Intravenous Every 8 hours 02/25/19 0130 02/25/19 0436   02/23/19 1400  erythromycin 200  mg in sodium chloride 0.9 % 100 mL IVPB     200 mg 100 mL/hr over 60 Minutes Intravenous Every 8 hours 02/23/19 1041 02/26/19 0559   02/23/19 1200  Ampicillin-Sulbactam (UNASYN) 3 g in sodium chloride 0.9 % 100 mL IVPB     3 g 200 mL/hr over 30 Minutes Intravenous 2 times daily 02/23/19 1037 02/26/19 0959   02/21/19 1930  metroNIDAZOLE (FLAGYL) IVPB 500 mg  Status:  Discontinued     500 mg 100 mL/hr over 60 Minutes Intravenous Every 6 hours 02/21/19 1923 02/23/19 1037   02/21/19 1400  cefTRIAXone (ROCEPHIN) 2 g in sodium chloride 0.9 % 100 mL IVPB  Status:  Discontinued     2 g 200 mL/hr over 30 Minutes Intravenous Daily 02/21/19 1324 02/23/19 1037   02/21/19 1400  erythromycin 250 mg in sodium chloride 0.9 % 100 mL IVPB     250 mg 100 mL/hr over 60 Minutes Intravenous STAT 02/21/19 1335 02/21/19 1538   02/21/19 0145  ceFEPIme (MAXIPIME) 2 g in sodium chloride 0.9 % 100 mL IVPB  Status:  Discontinued     2 g 200 mL/hr over 30 Minutes Intravenous Every 24 hours 02/21/19 0141 02/21/19 0652      Medications   Scheduled Meds: . folic acid  1 mg Per Tube Daily  . insulin aspart  0-9 Units Subcutaneous Q4H  . insulin glargine  6 Units Subcutaneous Daily  . mouth rinse  15 mL Mouth Rinse BID  . multivitamin  15 mL Per Tube Daily  . pantoprazole (PROTONIX) IV  40 mg Intravenous Q12H  . QUEtiapine  25 mg Per Tube QHS  . sodium chloride flush  10-40 mL Intracatheter Q12H  .  thiamine  100 mg Per Tube Daily   Continuous Infusions: . sodium chloride Stopped (02/22/19 1223)  . ampicillin-sulbactam (UNASYN) IV Stopped (02/25/19 1016)  . dextrose 5 % with KCl 20 mEq / L 100 mL/hr at 02/25/19 1200  . erythromycin Stopped (02/25/19 0807)  . fentaNYL infusion INTRAVENOUS Stopped (02/25/19 0839)  . metronidazole Stopped (02/25/19 1136)   PRN Meds:.sodium chloride, fentaNYL, midazolam, sodium chloride flush   Data Review:   Micro Results Recent Results (from the past 240 hour(s))  MRSA PCR Screening     Status: None   Collection Time: 02/20/19 10:07 PM  Result Value Ref Range Status   MRSA by PCR NEGATIVE NEGATIVE Final    Comment:        The GeneXpert MRSA Assay (FDA approved for NASAL specimens only), is one component of a comprehensive MRSA colonization surveillance program. It is not intended to diagnose MRSA infection nor to guide or monitor treatment for MRSA infections. Performed at [facility], [address] [place], [place] [address]   Urine Culture     Status: None   Collection Time: 02/21/19  1:44 AM  Result Value Ref Range Status   Specimen Description   Final    URINE, RANDOM Performed at [facility], [address], [place], [place] [address]    Special Requests   Final    NONE Performed at [facility], [address], [place], [place] [address]    Culture   Final    NO GROWTH Performed at [facility], [address], [place], [place] [address]    Report Status 02/22/2019 FINAL  Final  CULTURE, BLOOD (ROUTINE X 2) w Reflex to ID Panel     Status: None (Preliminary result)  Collection Time: 02/21/19  2:19 AM  Result Value Ref Range Status   Specimen Description BLOOD BLOOD LEFT FOREARM  Final   Special Requests   Final    BOTTLES DRAWN AEROBIC ONLY Blood Culture results may not be optimal due to an inadequate volume of blood received in culture bottles   Culture   Final    NO GROWTH 4  DAYS Performed at [facility], [address] [place], [place] [address]    Report Status PENDING  Incomplete  CULTURE, BLOOD (ROUTINE X 2) w Reflex to ID Panel     Status: None (Preliminary result)   Collection Time: 02/21/19  2:19 AM  Result Value Ref Range Status   Specimen Description BLOOD BLOOD LEFT WRIST  Final   Special Requests   Final    BOTTLES DRAWN AEROBIC ONLY Blood Culture results may not be optimal due to an inadequate volume of blood received in culture bottles   Culture   Final    NO GROWTH 4 DAYS Performed at [facility], [address], [place], [place] [address]    Report Status PENDING  Incomplete    Radiology Reports Dg Abd 1 View  Result Date: 02/21/2019 CLINICAL DATA:  NG tube placement EXAM: ABDOMEN - 1 VIEW COMPARISON:  02/21/2019 FINDINGS: Esophageal tube tip is in the left upper quadrant. Airspace disease at the left base. Mild diffuse gaseous dilatation of the bowel IMPRESSION: Esophageal tube tip is in the left upper quadrant of the abdomen, projecting over the proximal stomach Electronically Signed   By: Jasmine Pang M.D.   On: 02/21/2019 22:38   Dg Abd 1 View  Result Date: 02/21/2019 CLINICAL DATA:  OG tube placement EXAM: ABDOMEN - 1 VIEW COMPARISON:  02/21/2019 FINDINGS: Esophageal tube is looped back upon itself over the distal esophagus. Diffuse mild air distention of the bowel. Infiltrate at the left base. IMPRESSION: Esophageal tube looped back upon itself over the distal esophagus. Electronically Signed   By: Jasmine Pang M.D.   On: 02/21/2019 22:37   Dg Abd 1 View  Result Date: 02/21/2019 CLINICAL DATA:  Enteric tube placement. EXAM: ABDOMEN - 1 VIEW COMPARISON:  Abdominal x-ray from yesterday. FINDINGS: Enteric tube looped in the stomach with the tip near the gastric fundus. Left femoral approach central venous catheter with tip in the left common iliac vein. Paucity of bowel gas. No evidence of obstruction. Possible 3 mm  right renal calculus. No acute osseous abnormality. IMPRESSION: 1. Enteric tube looped in the stomach. 2. Left femoral approach central venous catheter tip in the left common iliac vein. 3. No acute findings. Electronically Signed   By: Obie Dredge M.D.   On: 02/21/2019 14:25   Dg Chest Port 1 View  Result Date: 02/22/2019 CLINICAL DATA:  58 year old female with respiratory failure. Aspiration of bloody gastric contents. EXAM: PORTABLE CHEST 1 VIEW COMPARISON:  420 through 20 and earlier. FINDINGS: Stable endotracheal tube tip in good position. Enteric tube courses to the midline, tip not included. Mediastinal contours remain normal. Mildly lower lung volumes. Confluent bilateral perihilar and lower lung airspace opacity with retrocardiac air bronchograms. Increased left lower lobe consolidation since yesterday with some obscuration of the left hemidiaphragm. No superimposed pneumothorax. No pleural effusion is evident. Upper lung pulmonary vascularity appears normal. IMPRESSION: 1. Stable lines and tubes. 2. Confluent bilateral perihilar and lower lung pneumonia with increased left lower lobe consolidation since yesterday. Electronically Signed   By: Odessa Fleming M.D.   On:  02/22/2019 05:51   Dg Chest Port 1 View  Result Date: 02/21/2019 CLINICAL DATA:  Hematemesis. EXAM: PORTABLE CHEST 1 VIEW COMPARISON:  Chest x-ray from same day at 7:16. FINDINGS: Interval placement of an endotracheal tube with the tip 2.0 cm above the carina. Enteric tube coiled within the stomach. The heart size and mediastinal contours are within normal limits. Normal pulmonary vascularity. New patchy perihilar and bilateral lower lobe consolidations. No pleural effusion or pneumothorax. No acute osseous abnormality. IMPRESSION: 1. Appropriately positioned endotracheal and enteric tubes. 2. New patchy perihilar and bilateral lower lobe consolidations, concerning for aspiration. Electronically Signed   By: Obie DredgeWilliam T Derry M.D.   On:  02/21/2019 14:22   Dg Chest Port 1 View  Result Date: 02/21/2019 CLINICAL DATA:  Leukocytosis. EXAM: PORTABLE CHEST 1 VIEW COMPARISON:  Radiographs of February 20, 2019. FINDINGS: The heart size and mediastinal contours are within normal limits. Both lungs are clear. The visualized skeletal structures are unremarkable. IMPRESSION: No active disease. Electronically Signed   By: Lupita RaiderJames  Erven Jr M.D.   On: 02/21/2019 08:17   Dg Abdomen Acute W/chest  Result Date: 02/20/2019 CLINICAL DATA:  Abdominal pain EXAM: DG ABDOMEN ACUTE W/ 1V CHEST COMPARISON:  None. FINDINGS: Moderate stool burden in the colon. The bowel gas pattern is normal. There is no evidence of free intraperitoneal air. No suspicious radio-opaque calculi or other significant radiographic abnormality is seen. Heart size and mediastinal contours are within normal limits. Both lungs are clear. IMPRESSION: Moderate stool burden.  No acute findings. Electronically Signed   By: Charlett NoseKevin  Dover M.D.   On: 02/20/2019 20:05     CBC Recent Labs  Lab 02/21/19 0551  02/22/19 1003  02/23/19 0533  02/24/19 0047 02/24/19 0453 02/24/19 0905 02/24/19 2051 02/25/19 0700  WBC 21.6*   < > 23.4*  --  23.8*  --  18.1* 17.7*  --  25.9* 24.0*  HGB 8.5*   < > 10.0*   < > 7.9*   < > 6.6* 6.5* 8.5* 8.5* 8.1*  HCT 26.4*   < > 27.2*   < > 22.0*   < > 18.9* 18.7* 23.9* 24.2* 22.9*  PLT 298   < > 146*  --  95*  --  65* 70*  --  80* 81*  MCV 92.3   < > 81.2  --  82.4  --  85.1 85.8  --  85.5 86.4  MCH 29.7   < > 29.9  --  29.6  --  29.7 29.8  --  30.0 30.6  MCHC 32.2   < > 36.8*  --  35.9  --  34.9 34.8  --  35.1 35.4  RDW 13.1   < > 14.1  --  14.9  --  15.2 15.3  --  14.5 14.6  LYMPHSABS 2.5  --  3.3  --   --   --   --   --   --   --   --   MONOABS 1.0  --  0.3  --   --   --   --   --   --   --   --   EOSABS 0.0  --  0.3  --   --   --   --   --   --   --   --   BASOSABS 0.0  --  0.1  --   --   --   --   --   --   --   --    < > =  values in this interval not  displayed.    Chemistries  Recent Labs  Lab 02/20/19 1913  02/22/19 0424  02/23/19 0533 02/23/19 1541 02/24/19 0047 02/24/19 0453 02/24/19 2204 02/25/19 0536  NA 135   < > 158*   < > 152* 150* 146* 146* 144 145  K 6.5*   < > 3.7   < > 3.9 3.6 3.6 3.3* 3.3* 3.8  CL 98   < > 116*   < > 116* 113* 112* 112* 108 110  CO2 9*   < > 30   < > GLUCOSE 866*   < > 340*   < > 178* 158* 237* 214* 109* 150*  BUN 34*   < > 56*   < > 51* 46* 41* 40* 33* 29*  CREATININE 2.76*   < > 2.30*   < > 2.71* 2.67* 2.63* 2.39* 2.23* 2.15*  CALCIUM 9.2   < > 6.2*   < > 6.3* 6.6* 6.4* 6.5* 7.3* 7.3*  MG  --    < > 1.4*  --  2.1  --  1.8 1.8 1.8  --   AST 18  --  93*  --   --   --   --   --   --   --   ALT 11  --  43  --   --   --   --   --   --   --   ALKPHOS 113  --  39  --   --   --   --   --   --   --   BILITOT 1.1  --  1.0  --   --   --   --   --   --   --    < > = values in this interval not displayed.   ------------------------------------------------------------------------------------------------------------------ estimated creatinine clearance is 19.4 mL/min (A) (by C-G formula based on SCr of 2.15 mg/dL (H)). ------------------------------------------------------------------------------------------------------------------ No results for input(s): HGBA1C in the last 72 hours. ------------------------------------------------------------------------------------------------------------------ No results for input(s): CHOL, HDL, LDLCALC, TRIG, CHOLHDL, LDLDIRECT in the last 72 hours. ------------------------------------------------------------------------------------------------------------------ No results for input(s): TSH, T4TOTAL, T3FREE, THYROIDAB in the last 72 hours.  Invalid input(s): FREET3 ------------------------------------------------------------------------------------------------------------------ No results for input(s): VITAMINB12, FOLATE, FERRITIN, TIBC, IRON,  RETICCTPCT in the last 72 hours.  Coagulation profile Recent Labs  Lab 02/21/19 0713  INR 1.5*    No results for input(s): DDIMER in the last 72 hours.  Cardiac Enzymes Recent Labs  Lab 02/21/19 2156 02/22/19 0424 02/22/19 1003  TROPONINI 0.26* 0.46* 0.42*   ------------------------------------------------------------------------------------------------------------------ Invalid input(s): POCBNP    Assessment & Plan  Patient is 58 year old admitted with nausea and vomiting  #1 DKA (diabetic ketoacidoses) (HCC) -resolved continue Lantus and sliding scale insulin   #2. upper GI bleed .  Due to duodenal, gastric ulcers, status post EGD x2.  Bleeding resolved, advance to carb modified diet.  Seen by gastroenterology.  Continue PPIs.   #3 hypotension related to likely upper GI bleed status post transfusion, hypotension improved. Continue stress dose steroids and antibiotics  #4 AKI (acute kidney injury) (HCC)  Slowly improving.   #5 leukocytosis possible reactive better slightly today  #6. anemia due to upper GI bleed; that is post blood transfusion, endoscopies, no hemoglobin stable.    7. hypokalemia, ; replaced, improved #8. hypovolemic shock, use vasopressors, stress dose steroids, and continue IV fluids, n.p.o., Dr. tolerated has been notified by intensivist  regarding ongoing blood loss  miscellaneous SCDs for DVT prophylaxis COVID-19 has been ruled out.     Code Status Orders  (From admission, onward)         Start     Ordered   02/20/19 2207  Full code  Continuous     02/20/19 2206        Code Status History    This patient has a current code status but no historical code status.    Advance Directive Documentation     Most Recent Value  Type of Advance Directive  Healthcare Power of Attorney [Daughter- Tasha]  Pre-existing out of facility DNR order (yellow form or pink MOST form)  -  "MOST" Form in Place?  -           Consults pulm  ccm  DVT Prophylaxis scd's  Lab Results  Component Value Date   PLT 81 (L) 02/25/2019     Time Spent in minutes  spent   Katha Hamming M.D on 02/25/2019 at 1:51 PM  Between 7am to 6pm - Pager - [phone]  After 6pm go to [email or web address] - Social research officer, government  [employer]   Office  [phone]

## 2019-02-25 NOTE — Progress Notes (Signed)
CRITICAL CARE NOTE  SYNOPSIS  Severe ACUTE Hypoxic and Hypercapnic Respiratory Failures/p aspiration of blood/gastric contents from acute GIB with acute encephalopathy from acidosis and severe hemorrhagic shock  CC  follow up respiratory failure  SUBJECTIVE Patient remains critically ill Prognosis is guarded Remains on vent   second EGD 4/25 One oozing linear gastric ulcer with pigmented material was found in the cardia. The lesion was 15 mm in largest dimension.  Area was successfully  injected with 2 mL of a 1:10,000 solution of epinephrine for hemostasis.  Coagulation for hemostasis using bipolar probe was successful.    SIGNIFICANT EVENTS 4/22 admitted for DKA nad severe hemmorhagic shock 4/22 intubated, aspiration of gastric contents and blood #8 ETT and LEFT FEM LINE PLACED 4/22 severe small NON bleeding doudenal ulcers s/p EGD 4/23 remains on vasopressors, multiorgan failure 4/24 remains on vasopressors, multiorgan failure 4/25 oozing linear gastric ulcer-injected and cauterized  REVIEW OF SYSTEMS  PATIENT IS UNABLE TO PROVIDE COMPLETE REVIEW OF SYSTEMS DUE TO SEVERE CRITICAL ILLNESS   PHYSICAL EXAMINATION:  GENERAL:critically ill appearing, +resp distress HEAD: Normocephalic, atraumatic.  EYES: Pupils equal, round, reactive to light.  No scleral icterus.  MOUTH: Moist mucosal membrane. NECK: Supple. No thyromegaly. No nodules. No JVD.  PULMONARY: +rhonchi, +wheezing CARDIOVASCULAR: S1 and S2. Regular rate and rhythm. No murmurs, rubs, or gallops.  GASTROINTESTINAL: Soft, nontender, -distended. No masses. Positive bowel sounds. No hepatosplenomegaly.  MUSCULOSKELETAL: No swelling, clubbing, or edema.  NEUROLOGIC: obtunded, GCS<8 SKIN:intact,warm,dry  BP 134/73   Pulse 74   Temp 97.9 F (36.6 C) (Oral)   Resp 20   Ht 4' 9"  (1.448 m)   Wt 48.6 kg   SpO2 100%   BMI 23.19 kg/m   Vent Mode: PRVC FiO2 (%):  [40 %] 40 % Set Rate:  [18 bmp] 18 bmp Vt Set:   [400 mL] 400 mL PEEP:  [5 cmH20] 5 cmH20 Plateau Pressure:  [12 cmH20-22 cmH20] 22 cmH20   CULTURE RESULTS   Recent Results (from the past 240 hour(s))  MRSA PCR Screening     Status: None   Collection Time: 02/20/19 10:07 PM  Result Value Ref Range Status   MRSA by PCR NEGATIVE NEGATIVE Final    Comment:        The GeneXpert MRSA Assay (FDA approved for NASAL specimens only), is one component of a comprehensive MRSA colonization surveillance program. It is not intended to diagnose MRSA infection nor to guide or monitor treatment for MRSA infections. Performed at [facility], [address], [place], [place] [address]   Urine Culture     Status: None   Collection Time: 02/21/19  1:44 AM  Result Value Ref Range Status   Specimen Description   Final    URINE, RANDOM Performed at [facility], [address], [place], [place] [address]    Special Requests   Final    NONE Performed at [facility], [address], [place], [place] [address]    Culture   Final    NO GROWTH Performed at [facility], [place] [address] [place], [place] [address]    Report Status 02/22/2019 FINAL  Final  CULTURE, BLOOD (ROUTINE X 2) w Reflex to ID Panel     Status: None (Preliminary result)   Collection Time: 02/21/19  2:19 AM  Result Value Ref Range Status   Specimen Description BLOOD BLOOD LEFT FOREARM  Final   Special Requests   Final    BOTTLES DRAWN  AEROBIC ONLY Blood Culture results may not be optimal due to an inadequate volume of blood received in culture bottles   Culture   Final    NO GROWTH 4 DAYS Performed at [facility] [address], [place], [place] [address]    Report Status PENDING  Incomplete  CULTURE, BLOOD (ROUTINE X 2) w Reflex to ID Panel     Status: None (Preliminary result)   Collection Time: 02/21/19  2:19 AM  Result Value Ref Range Status   Specimen Description BLOOD BLOOD LEFT WRIST  Final   Special Requests    Final    BOTTLES DRAWN AEROBIC ONLY Blood Culture results may not be optimal due to an inadequate volume of blood received in culture bottles   Culture   Final    NO GROWTH 4 DAYS Performed at [facility], [address], [place], [place] [address]    Report Status PENDING  Incomplete           Indwelling Urinary Catheter continued, requirement due to   Reason to continue Indwelling Urinary Catheter for strict Intake/Output monitoring for hemodynamic instability   Central Line continued, requirement due to   Reason to continue [employer] Monitoring of central venous pressure or other hemodynamic parameters   Ventilator continued, requirement due to, resp failure    Ventilator Sedation RASS 0 to -2     ASSESSMENT AND PLAN SYNOPSIS   Severe ACUTE Hypoxic and Hypercapnic Respiratory Failure -continue Full MV support -continue Bronchodilator Therapy -Wean Fio2 and PEEP as tolerated -will perform SAT/SBT when respiratory parameters are met Plan for SAT/SBT today Plan for SAT  ACUTE KIDNEY INJURY/Renal Failure -follow chem 7 -follow UO -continue Foley Catheter-assess need    NEUROLOGY - intubated and sedated - minimal sedation to achieve a RASS goal: -1 Wake up assessment pending   SHOCK-HYPOVOLUMIC -use vasopressors to keep MAP>65 if needed -aggressive IV fluid resuscitation  CARDIAC ICU monitoring  ID -continue IV abx as prescibed -follow up cultures  GI GI PROPHYLAXIS as indicated  NUTRITIONAL STATUS DIET-->NPO Constipation protocol as indicated  ENDO - ICU hypoglycemic\Hyperglycemia protocol -check FSBS per protocol   ELECTROLYTES -follow labs as needed -replace as needed -pharmacy consultation and following   DVT/GI PRX ordered TRANSFUSIONS AS NEEDED MONITOR FSBS ASSESS the need for LABS as needed   Critical Care Time devoted to patient care services described in this note is 32 minutes.   Overall, patient is  critically ill, prognosis is guarded.     Corrin Parker, M.D.  [facility]  Medical Director [place] Director [facility] Cardio-Pulmonary Department

## 2019-02-25 NOTE — Progress Notes (Signed)
[facility]  Subjective: Patient seen for f/u UGI bleed from Gastric ulcer, duodenal ulcers. Now extubated, alert though seems moderately confused. Denies pain.  Objective: Vital signs in last 24 hours: Temp:  [97.9 F (36.6 C)-98.3 F (36.8 C)] 98.1 F (36.7 C) (04/26 1600) Pulse Rate:  [52-93] 72 (04/26 1800) Resp:  [9-31] 19 (04/26 1800) BP: (76-134)/(49-79) 118/60 (04/26 1800) SpO2:  [95 %-100 %] 100 % (04/26 1800) FiO2 (%):  [40 %] 40 % (04/26 0840) Blood pressure 118/60, pulse 72, temperature 98.1 F (36.7 C), temperature source Oral, resp. rate 19, height 4\' 9"  (1.448 m), weight 48.6 kg, SpO2 100 %.    Intake/Output from previous day: 04/25 0701 - 04/26 0700 In: 3789.1 [I.V.:2700.1; Blood:247.9; NG/GT:45.7; IV Piggyback:645.4] Out: 9604 [VWUJW:[id number] [Urine:1445; Stool:250]  Intake/Output this shift: No intake/output data recorded.   General appearance: Alert, NAD. Resp:  Coarse BS's, no wheezes. Cardio:  RRR GI:  Soft, BS+ , nt Extremities:  Trace edema.   Lab Results: Results for orders placed or performed during the hospital encounter of 02/20/19 (from the past 24 hour(s))  Glucose, capillary     Status: None   Collection Time: 02/24/19  8:01 PM  Result Value Ref Range   Glucose-Capillary 89 70 - 99 mg/dL  CBC     Status: Abnormal   Collection Time: 02/24/19  8:51 PM  Result Value Ref Range   WBC 25.9 (H) 4.0 - 10.5 K/uL   RBC 2.83 (L) 3.87 - 5.11 MIL/uL   Hemoglobin 8.5 (L) 12.0 - 15.0 g/dL   HCT 47.824.2 (L) 29.536.0 - 62.146.0 %   MCV 85.5 80.0 - 100.0 fL   MCH 30.0 26.0 - 34.0 pg   MCHC 35.1 30.0 - 36.0 g/dL   RDW 30.814.5 65.711.5 - 84.615.5 %   Platelets 80 (L) 150 - 400 K/uL   nRBC 0.2 0.0 - 0.2 %  Basic metabolic panel     Status: Abnormal   Collection Time: 02/24/19 10:04 PM  Result Value Ref Range   Sodium 144 135 - 145 mmol/L   Potassium 3.3 (L) 3.5 - 5.1 mmol/L   Chloride 108 98 - 111 mmol/L   CO2 31 22 - 32 mmol/L   Glucose,  Bld 109 (H) 70 - 99 mg/dL   BUN 33 (H) 6 - 20 mg/dL   Creatinine, Ser 9.622.23 (H) 0.44 - 1.00 mg/dL   Calcium 7.3 (L) 8.9 - 10.3 mg/dL   GFR calc non Af Amer 24 (L) >60 mL/min   GFR calc Af Amer 27 (L) >60 mL/min   Anion gap 5 5 - 15  Magnesium     Status: None   Collection Time: 02/24/19 10:04 PM  Result Value Ref Range   Magnesium 1.8 1.7 - 2.4 mg/dL  Glucose, capillary     Status: None   Collection Time: 02/25/19 12:19 AM  Result Value Ref Range   Glucose-Capillary 99 70 - 99 mg/dL  Glucose, capillary     Status: Abnormal   Collection Time: 02/25/19  4:01 AM  Result Value Ref Range   Glucose-Capillary 139 (H) 70 - 99 mg/dL  Basic metabolic panel     Status: Abnormal   Collection Time: 02/25/19  5:36 AM  Result Value Ref Range   Sodium 145 135 - 145 mmol/L   Potassium 3.8 3.5 - 5.1 mmol/L   Chloride 110 98 - 111 mmol/L   CO2 31 22 - 32 mmol/L   Glucose, Bld 150 (H) 70 -  99 mg/dL   BUN 29 (H) 6 - 20 mg/dL   Creatinine, Ser 4.98 (H) 0.44 - 1.00 mg/dL   Calcium 7.3 (L) 8.9 - 10.3 mg/dL   GFR calc non Af Amer 25 (L) >60 mL/min   GFR calc Af Amer 29 (L) >60 mL/min   Anion gap 4 (L) 5 - 15  CBC     Status: Abnormal   Collection Time: 02/25/19  7:00 AM  Result Value Ref Range   WBC 24.0 (H) 4.0 - 10.5 K/uL   RBC 2.65 (L) 3.87 - 5.11 MIL/uL   Hemoglobin 8.1 (L) 12.0 - 15.0 g/dL   HCT 26.4 (L) 15.8 - 30.9 %   MCV 86.4 80.0 - 100.0 fL   MCH 30.6 26.0 - 34.0 pg   MCHC 35.4 30.0 - 36.0 g/dL   RDW 40.7 68.0 - 88.1 %   Platelets 81 (L) 150 - 400 K/uL   nRBC 0.2 0.0 - 0.2 %  Glucose, capillary     Status: Abnormal   Collection Time: 02/25/19  7:24 AM  Result Value Ref Range   Glucose-Capillary 132 (H) 70 - 99 mg/dL  Glucose, capillary     Status: Abnormal   Collection Time: 02/25/19 11:46 AM  Result Value Ref Range   Glucose-Capillary 114 (H) 70 - 99 mg/dL  Glucose, capillary     Status: Abnormal   Collection Time: 02/25/19  4:42 PM  Result Value Ref Range   Glucose-Capillary  101 (H) 70 - 99 mg/dL  CBC     Status: Abnormal   Collection Time: 02/25/19  4:51 PM  Result Value Ref Range   WBC 24.1 (H) 4.0 - 10.5 K/uL   RBC 3.08 (L) 3.87 - 5.11 MIL/uL   Hemoglobin 9.2 (L) 12.0 - 15.0 g/dL   HCT 10.3 (L) 15.9 - 45.8 %   MCV 87.3 80.0 - 100.0 fL   MCH 29.9 26.0 - 34.0 pg   MCHC 34.2 30.0 - 36.0 g/dL   RDW 59.2 92.4 - 46.2 %   Platelets 106 (L) 150 - 400 K/uL   nRBC 0.1 0.0 - 0.2 %     Recent Labs    02/24/19 2051 02/25/19 0700 02/25/19 1651  WBC 25.9* 24.0* 24.1*  HGB 8.5* 8.1* 9.2*  HCT 24.2* 22.9* 26.9*  PLT 80* 81* 106*   BMET Recent Labs    02/24/19 0453 02/24/19 2204 02/25/19 0536  NA 146* 144 145  K 3.3* 3.3* 3.8  CL 112* 108 110  CO2 31 31 31   GLUCOSE 214* 109* 150*  BUN 40* 33* 29*  CREATININE 2.39* 2.23* 2.15*  CALCIUM 6.5* 7.3* 7.3*   LFT Recent Labs    02/23/19 0533  ALBUMIN 1.7*   PT/INR No results for input(s): LABPROT, INR in the last 72 hours. Hepatitis Panel No results for input(s): HEPBSAG, HCVAB, HEPAIGM, HEPBIGM in the last 72 hours. C-Diff No results for input(s): CDIFFTOX in the last 72 hours. No results for input(s): CDIFFPCR in the last 72 hours.   Studies/Results: No results found.  Scheduled Inpatient Medications:   . folic acid  1 mg Per Tube Daily  . insulin aspart  0-9 Units Subcutaneous Q4H  . insulin glargine  6 Units Subcutaneous Daily  . mouth rinse  15 mL Mouth Rinse BID  . multivitamin  15 mL Per Tube Daily  . pantoprazole (PROTONIX) IV  40 mg Intravenous Q12H  . QUEtiapine  25 mg Per Tube QHS  . sodium chloride flush  10-40 mL Intracatheter Q12H  . thiamine  100 mg Per Tube Daily    Continuous Inpatient Infusions:   . sodium chloride Stopped (02/22/19 1223)  . ampicillin-sulbactam (UNASYN) IV Stopped (02/25/19 1016)  . dextrose 5 % with KCl 20 mEq / L 100 mL/hr at 02/25/19 1851  . erythromycin Stopped (02/25/19 1622)  . fentaNYL infusion INTRAVENOUS Stopped (02/25/19 0839)  .  metronidazole 100 mL/hr at 02/25/19 1851    PRN Inpatient Medications:  sodium chloride, fentaNYL, midazolam, sodium chloride flush    Assessment:  1. Duodenal ulcer and Gastric ulcer bleeds. Stable s/p EGD x 2.   2. DKA.  3. Hypoxic respiratory failure, resolving. Patient extubated to RA.  4. F/E/N - Currently still NPO.   Plan:  1. Advance diet to carb modified.  2. Check stool H Pylori.  3. Continue acid suppression, Pantoprazole  bid.  4. Will sign off for now. Call back in the interim if needed.   Dael Howland K. Norma Fredrickson, M.D. 02/25/2019, 7:37 PM

## 2019-02-26 ENCOUNTER — Encounter: Payer: Self-pay | Admitting: Internal Medicine

## 2019-02-26 LAB — CBC
HCT: 25.4 % — ABNORMAL LOW (ref 36.0–46.0)
HCT: 31.2 % — ABNORMAL LOW (ref 36.0–46.0)
Hemoglobin: 8.8 g/dL — ABNORMAL LOW (ref 12.0–15.0)
Hemoglobin: 10.5 g/dL — ABNORMAL LOW (ref 12.0–15.0)
MCH: 30 pg (ref 26.0–34.0)
MCH: 29.9 pg (ref 26.0–34.0)
MCHC: 34.6 g/dL (ref 30.0–36.0)
MCHC: 33.7 g/dL (ref 30.0–36.0)
MCV: 86.7 fL (ref 80.0–100.0)
MCV: 88.9 fL (ref 80.0–100.0)
Platelets: 119 10*3/uL — ABNORMAL LOW (ref 150–400)
Platelets: 191 10*3/uL (ref 150–400)
RBC: 2.93 MIL/uL — ABNORMAL LOW (ref 3.87–5.11)
RBC: 3.51 MIL/uL — ABNORMAL LOW (ref 3.87–5.11)
RDW: 14.1 % (ref 11.5–15.5)
RDW: 14.2 % (ref 11.5–15.5)
WBC: 14.2 10*3/uL — ABNORMAL HIGH (ref 4.0–10.5)
WBC: 14.7 10*3/uL — ABNORMAL HIGH (ref 4.0–10.5)
nRBC: 0.1 % (ref 0.0–0.2)
nRBC: 0.1 % (ref 0.0–0.2)

## 2019-02-26 LAB — CULTURE, BLOOD (ROUTINE X 2)
Culture: NO GROWTH
Culture: NO GROWTH

## 2019-02-26 LAB — COMPREHENSIVE METABOLIC PANEL
ALT: 64 U/L — ABNORMAL HIGH (ref 0–44)
AST: 103 U/L — ABNORMAL HIGH (ref 15–41)
Albumin: 1.9 g/dL — ABNORMAL LOW (ref 3.5–5.0)
Alkaline Phosphatase: 65 U/L (ref 38–126)
Anion gap: 3 — ABNORMAL LOW (ref 5–15)
BUN: 20 mg/dL (ref 6–20)
CO2: 29 mmol/L (ref 22–32)
Calcium: 7.7 mg/dL — ABNORMAL LOW (ref 8.9–10.3)
Chloride: 114 mmol/L — ABNORMAL HIGH (ref 98–111)
Creatinine, Ser: 1.91 mg/dL — ABNORMAL HIGH (ref 0.44–1.00)
GFR calc Af Amer: 33 mL/min — ABNORMAL LOW (ref >60)
GFR calc non Af Amer: 29 mL/min — ABNORMAL LOW (ref >60)
Glucose, Bld: 146 mg/dL — ABNORMAL HIGH (ref 70–99)
Potassium: 3.8 mmol/L (ref 3.5–5.1)
Sodium: 146 mmol/L — ABNORMAL HIGH (ref 135–145)
Total Bilirubin: 0.9 mg/dL (ref 0.3–1.2)
Total Protein: 3.9 g/dL — ABNORMAL LOW (ref 6.5–8.1)

## 2019-02-26 LAB — MAGNESIUM: Magnesium: 1.7 mg/dL (ref 1.7–2.4)

## 2019-02-26 LAB — GLUCOSE, CAPILLARY
Glucose-Capillary: 100 mg/dL — ABNORMAL HIGH (ref 70–99)
Glucose-Capillary: 121 mg/dL — ABNORMAL HIGH (ref 70–99)
Glucose-Capillary: 139 mg/dL — ABNORMAL HIGH (ref 70–99)
Glucose-Capillary: 149 mg/dL — ABNORMAL HIGH (ref 70–99)
Glucose-Capillary: 171 mg/dL — ABNORMAL HIGH (ref 70–99)
Glucose-Capillary: 189 mg/dL — ABNORMAL HIGH (ref 70–99)

## 2019-02-26 LAB — PHOSPHORUS: Phosphorus: 1.5 mg/dL — ABNORMAL LOW (ref 2.5–4.6)

## 2019-02-26 MED ORDER — FUROSEMIDE 10 MG/ML IJ SOLN
40.0000 mg | Freq: Once | INTRAMUSCULAR | Status: AC
  Administered 2019-02-26: 40 mg via INTRAVENOUS
  Filled 2019-02-26: qty 4

## 2019-02-26 MED ORDER — ADULT MULTIVITAMIN W/MINERALS CH
1.0000 | ORAL_TABLET | Freq: Every day | ORAL | Status: DC
  Administered 2019-02-26 – 2019-02-28 (×3): 1 via ORAL
  Filled 2019-02-26 (×4): qty 1

## 2019-02-26 MED ORDER — FUROSEMIDE 10 MG/ML IJ SOLN
20.0000 mg | Freq: Once | INTRAMUSCULAR | Status: AC
  Administered 2019-02-26: 20 mg via INTRAVENOUS
  Filled 2019-02-26: qty 2

## 2019-02-26 MED ORDER — ENSURE MAX PROTEIN PO LIQD
11.0000 [oz_av] | Freq: Two times a day (BID) | ORAL | Status: DC
  Administered 2019-02-27 (×2): 11 [oz_av] via ORAL
  Filled 2019-02-26: qty 330

## 2019-02-26 MED ORDER — POTASSIUM PHOSPHATE MONOBASIC 500 MG PO TABS
500.0000 mg | ORAL_TABLET | Freq: Three times a day (TID) | ORAL | Status: AC
  Administered 2019-02-26 (×2): 500 mg via ORAL
  Filled 2019-02-26 (×2): qty 1

## 2019-02-26 MED ORDER — MAGNESIUM SULFATE 2 GM/50ML IV SOLN
2.0000 g | Freq: Once | INTRAVENOUS | Status: AC
  Administered 2019-02-26: 2 g via INTRAVENOUS
  Filled 2019-02-26: qty 50

## 2019-02-26 MED ORDER — FENTANYL CITRATE (PF) 100 MCG/2ML IJ SOLN
12.5000 ug | INTRAMUSCULAR | Status: DC | PRN
  Administered 2019-02-26 (×2): 25 ug via INTRAVENOUS
  Administered 2019-02-26 (×4): 12.5 ug via INTRAVENOUS
  Administered 2019-02-26 – 2019-02-27 (×2): 25 ug via INTRAVENOUS
  Administered 2019-02-27 (×3): 12.5 ug via INTRAVENOUS
  Filled 2019-02-26 (×11): qty 2

## 2019-02-26 NOTE — Progress Notes (Signed)
PHARMACY CONSULT NOTE - FOLLOW UP  Pharmacy Consult for Electrolyte Monitoring and Replacement   Recent Labs: Potassium (mmol/L)  Date Value  02/26/2019 3.8   Magnesium (mg/dL)  Date Value  78/67/5449 1.7   Calcium (mg/dL)  Date Value  20/08/711 7.7 (L)   Albumin (g/dL)  Date Value  19/75/8832 1.9 (L)   Phosphorus (mg/dL)  Date Value  54/98/2641 1.5 (L)   Sodium (mmol/L)  Date Value  02/26/2019 146 (H)     Assessment: Patient was admitted for hematemesis and EGD performed found oozing gastric ulcer.  Goal of Therapy:  Phosphorus 2.5 - 4.5, Potassium ~4.0, Magnesium ~2.0  Plan:  Patient is receiving D5/KCl 20 @ 100 mL/hr. Ordered KPhos 500 mg tablets x 2 and Mg sulfate 2 g IV x1. Will follow up with AM labs.  Pharmacy will continue to monitor.  Mauri Reading ,PharmD Clinical Pharmacist 02/26/2019 2:03 PM

## 2019-02-26 NOTE — Evaluation (Signed)
Physical Therapy Evaluation Patient Details Name: Bethany James MRN: [id number] DOB: 1961-08-14 Today's Date: 02/26/2019   History of Present Illness  Bethany James is a 57yo female who [facility] c hematemesis, BG in 800s upon arrival, pt admitted with DKA. Pt was intubated on 4/22, upper GI endoscopy on 4/22, 4/23, 4/25. Pt extubated on 4/26, now on RA. Pt noted to have gastric ulcer and duodenal ulcer, now followed by GI.    Clinical Impression  Pt admitted with above diagnosis. Pt currently with functional limitations due to the deficits listed below (see "PT Problem List"). Upon entry, pt in bed, awake and agreeable to participate. The pt is alert and oriented to self, disoriented to location and time. Pt presents c flat affect, delayed response, and intermittently has variable response content during questioning. Total Assist to EOB and Max assist required for standing and pivot to chair. Pt tolerates standing for 60+ seconds for pericare, but HR increases to 130s. Functional mobility assessment demonstrates increased effort/time requirements, poor tolerance, and need for physical assistance, whereas the patient performed these at a higher level of independence PTA. Pt will benefit from skilled PT intervention to increase independence and safety with basic mobility in preparation for discharge to the venue listed below.       Follow Up Recommendations SNF;Supervision/Assistance - 24 hour    Equipment Recommendations  (pt will need extensive DME if DC rehab placement is unavilable)    Recommendations for Other Services       Precautions / Restrictions Precautions Precautions: Fall Precaution Comments: Watch LUE, very edematous and painful Restrictions Weight Bearing Restrictions: No      Mobility  Bed Mobility Overal bed mobility: Needs Assistance Bed Mobility: Supine to Sit     Supine to sit: Total assist     General bed mobility comments: assist with Trunk flexion,  pivot, and BLE. pt quite weak but inititates movement   Transfers Overall transfer level: Needs assistance Equipment used: None(dependent transfer) Transfers: Sit to/from [employer] CorporationStand;Stand Pivot Transfers Sit to Stand: Max assist Stand pivot transfers: (does well stepping while supported physically)       General transfer comment: remaisn standing with max support, x60 seconds for pericare (RN performing), Eventually Lt knee buckling warrants Lt knee block  Ambulation/Gait Ambulation/Gait assistance: (unable at this time)              Information systems managertairs            Wheelchair Mobility    Modified Rankin (Stroke Patients Only)       Balance Overall balance assessment: Needs assistance   Sitting balance-Leahy Scale: Zero       Standing balance-Leahy Scale: Zero                               Pertinent Vitals/Pain Pain Assessment: Faces Faces Pain Scale: Hurts little more Pain Location: gestures to left hand/wrist, quite edematous Pain Intervention(s): Limited activity within patient's tolerance;Monitored during session;Repositioned    Home Living Family/patient expects to be discharged to:: [place] Living Arrangements: Children(Tasha ) Available Help at Discharge: (DTR) Type of Home: Mobile home Home Access: Level entry     Home Layout: One level Home Equipment: None      Prior Function Level of Independence: Independent with assistive device(s);Needs assistance   Gait / Transfers Assistance Needed: DTR provides transportation  ADL's / Homemaking Assistance Needed: DTR assists with bathing, dressing, toiletting  Hand Dominance   Dominant Hand: Right    Extremity/Trunk Assessment   Upper Extremity Assessment Upper Extremity Assessment: Generalized weakness    Lower Extremity Assessment Lower Extremity Assessment: Generalized weakness    Cervical / Trunk Assessment Cervical / Trunk Assessment: (difficulty  with trunk control at EOB )  Communication      Cognition   Behavior During Therapy: Flat affect(mild AMS, difficulty with answering questions ) Overall Cognitive Status: No family/caregiver present to determine baseline cognitive functioning                                 General Comments: limited awareness of situation       General Comments      Exercises     Assessment/Plan    PT Assessment Patient needs continued PT services  PT Problem List Decreased strength;Decreased mobility;Decreased activity tolerance;Decreased balance;Decreased cognition;Decreased coordination;Decreased safety awareness       PT Treatment Interventions DME instruction;Gait training;Stair training;Functional mobility training;Balance training;Therapeutic exercise;Cognitive remediation;Patient/family education    PT Goals (Current goals can be found in the Care Plan section)  Acute Rehab PT Goals Patient Stated Goal: regain strength and mobility to PLOF  PT Goal Formulation: With patient Time For Goal Achievement: 03/12/19 Potential to Achieve Goals: Good    Frequency Min 2X/week   Barriers to discharge Inaccessible home environment      Co-evaluation               AM-PAC PT "6 Clicks" Mobility  Outcome Measure Help needed turning from your back to your side while in a flat bed without using bedrails?: Total Help needed moving from lying on your back to sitting on the side of a flat bed without using bedrails?: Total Help needed moving to and from a bed to a chair (including a wheelchair)?: Total Help needed standing up from a chair using your arms (e.g., wheelchair or bedside chair)?: Total Help needed to walk in hospital room?: Total Help needed climbing 3-5 steps with a railing? : Total 6 Click Score: 6    End of Session   Activity Tolerance: Patient limited by fatigue;Patient tolerated treatment well Patient left: in chair;with call bell/phone within reach;with  nursing/sitter in room Nurse Communication: Other (comment)(HR while standing) PT Visit Diagnosis: Unsteadiness on feet (R26.81);Other abnormalities of gait and mobility (R26.89);Muscle weakness (generalized) (M62.81);Difficulty in walking, not elsewhere classified (R26.2)    Time: 1202-1232 PT Time Calculation (min) (ACUTE ONLY): 30 min   Charges:   PT Evaluation $PT Eval Moderate Complexity: 1 Mod PT Treatments $Therapeutic Exercise: 8-22 mins       1:04 PM, 02/26/19 Rosamaria Lints, PT, DPT Physical Therapist - [facility]  [phone] (ASCOM)    Cia Garretson C 02/26/2019, 1:00 PM

## 2019-02-26 NOTE — Progress Notes (Signed)
Nutrition Follow-up  RD working remotely.  DOCUMENTATION CODES:   Not applicable  INTERVENTION:  Provide Ensure Max Protein po BID, each supplement provides 150 kcal and 30 grams of protein.  NUTRITION DIAGNOSIS:   Inadequate oral intake related to inability to eat as evidenced by NPO status.  Resolving - diet advanced.  GOAL:   Patient will meet greater than or equal to 90% of their needs  Progressing.  MONITOR:   PO intake, Supplement acceptance, Labs, Weight trends, Skin, I & O's  REASON FOR ASSESSMENT:   Malnutrition Screening Tool, Ventilator    ASSESSMENT:   58 year old female with PMHx of DM admitted with DKA, severe hemorrhagic shock, required intubation on 4/22, aspiration PNA, upper GI bleed (duodenal ulcers found on EGD 4/22).   -Patient underwent repeat EGD on 4/25. Found oozing gastric ulcer s/p injection with epinephrine and bipolar cautery. Also non-bleeding duodenal ulcers. -Patient extubated on 4/26. Diet was advanced to carbohydrate modified.  Per discussion on rounds patient was able to eat some breakfast this morning. Tolerating diet so far. Plan is to start oral nutrition supplement today. Electrolytes being monitored by pharmacy. Patient continues to have liquid bowel movements per rectal tube.  Medications reviewed and include: folic acid 1 mg daily per tube, Novolog 0-9 units Q4hrs, Lantus 6 units daily, MVI daily, pantoprazole, potassium phosphate 500 mg for two doses today, Seroquel 25 mg QHS, thiamine 100 mg daily, D5W with KCl 20 mEq/L at 100 mL/hr, magnesium sulfate 2 grams IV once today, Flagyl.  Labs reviewed: CBG 121-189, Sodium 146, Chloride 114, Creatinine 1.91, Anion gap 3, Phosphorus 1.5.  Diet Order:   Diet Order            Diet Carb Modified Fluid consistency: Thin; Room service appropriate? Yes  Diet effective now             EDUCATION NEEDS:   No education needs have been identified at this time  Skin:  Skin Assessment:  Reviewed RN Assessment(ecchymosis)  Last BM:  02/26/2019 - small type 7 per rectal tube placed 4/22  Height:   Ht Readings from Last 1 Encounters:  02/20/19 4\' 9"  (1.448 m)   Weight:   Wt Readings from Last 1 Encounters:  02/20/19 48.6 kg   Ideal Body Weight:  39.2 kg  BMI:  Body mass index is 23.19 kg/m.  Estimated Nutritional Needs:   Kcal:  1300-1500  Protein:  70-85 grams  Fluid:  1.2-1.5 L/day  Helane Rima, MS, RD, LDN Office: [phone] Pager: [phone] After Hours/Weekend Pager: [phone]

## 2019-02-26 NOTE — Progress Notes (Signed)
OT Cancellation Note  Patient Details Name: Bethany James MRN: [id number] DOB: 1961/11/01   Cancelled Treatment:    Reason Eval/Treat Not Completed: Patient at procedure or test/ unavailable(Pt. currently in nursing care. Will reattempt at a later time.)  Olegario Messier, MS, OTR/L 02/26/2019, 9:46 AM

## 2019-02-26 NOTE — Progress Notes (Signed)
[employer] - [facility]                                                                                                                                                                                  Patient Demographics   Bethany James, is a 58 y.o. female, DOB - 04/29/1961, ZOX:[id number]  Admit date - 02/20/2019   Admitting Physician Oralia Manis, MD  Outpatient Primary MD for the patient is System, Pcp Not In   LOS - 6  Subjective: patient denies any complaints. Review of Systems:   CONSTITUTIONAL: Intubated Vitals:   Vitals:   02/26/19 0742 02/26/19 0800 02/26/19 0900 02/26/19 1215  BP:  99/63 103/65   Pulse: 83 81 94 (!) 135  Resp: (!) 22 (!) 30 (!) 29   Temp: 98.8 F (37.1 C)     TempSrc: Oral     SpO2: 98% 98% 98% 98%  Weight:      Height:        Wt Readings from Last 3 Encounters:  02/20/19 48.6 kg  08/13/18 59 kg  04/06/18 71.2 kg     Intake/Output Summary (Last 24 hours) at 02/26/2019 1255 Last data filed at 02/26/2019 0935 Gross per 24 hour  Intake 2222.71 ml  Output 4625 ml  Net -2402.29 ml    Physical Exam:   GENERAL: Critically ill-appearing.   extubated yesterday,.  Saturating 98% on room air.  However tachycardic. HEAD, EYES, EARS, NOSE AND THROAT: Atraumatic, normocephalic. . Pupils equal and reactive to light. Sclerae anicteric. No conjunctival injection. No oro-pharyngeal erythema.  NECK: Supple. There is no jugular venous distention. No bruits, no lymphadenopathy, no thyromegaly.  HEART: Regular rate and rhythm,. No murmurs, no rubs, no clicks.  LUNGS: Clear to auscultation bilaterally. No rales or rhonchi. No wheezes.  ABDOMEN: Soft, flat, nontender, nondistended. Has good bowel sounds. No hepatosplenomegaly appreciated.  EXTREMITIES: No evidence of any cyanosis, clubbing, or peripheral edema.  +2 pedal and radial pulses bilaterally.  NEUROLOGIC: Focal neurological deficit observed. SKIN: Moist and warm with no  rashes appreciated.  Psych: Not anxious, depressed LN: No inguinal LN enlargement    Antibiotics   Anti-infectives (From admission, onward)   Start     Dose/Rate Route Frequency Ordered Stop   02/25/19 1015  metroNIDAZOLE (FLAGYL) IVPB 500 mg     500 mg 100 mL/hr over 60 Minutes Intravenous Every 8 hours 02/25/19 0436     02/25/19 0200  metroNIDAZOLE (FLAGYL) IVPB 500 mg  Status:  Discontinued     500 mg 100 mL/hr over 60 Minutes Intravenous Every 8 hours 02/25/19 0130 02/25/19 0436   02/23/19 1400  erythromycin 200 mg in  sodium chloride 0.9 % 100 mL IVPB     200 mg 100 mL/hr over 60 Minutes Intravenous Every 8 hours 02/23/19 1041 02/25/19 2351   02/23/19 1200  Ampicillin-Sulbactam (UNASYN) 3 g in sodium chloride 0.9 % 100 mL IVPB     3 g 200 mL/hr over 30 Minutes Intravenous 2 times daily 02/23/19 1037 02/25/19 2153   02/21/19 1930  metroNIDAZOLE (FLAGYL) IVPB 500 mg  Status:  Discontinued     500 mg 100 mL/hr over 60 Minutes Intravenous Every 6 hours 02/21/19 1923 02/23/19 1037   02/21/19 1400  cefTRIAXone (ROCEPHIN) 2 g in sodium chloride 0.9 % 100 mL IVPB  Status:  Discontinued     2 g 200 mL/hr over 30 Minutes Intravenous Daily 02/21/19 1324 02/23/19 1037   02/21/19 1400  erythromycin 250 mg in sodium chloride 0.9 % 100 mL IVPB     250 mg 100 mL/hr over 60 Minutes Intravenous STAT 02/21/19 1335 02/21/19 1538   02/21/19 0145  ceFEPIme (MAXIPIME) 2 g in sodium chloride 0.9 % 100 mL IVPB  Status:  Discontinued     2 g 200 mL/hr over 30 Minutes Intravenous Every 24 hours 02/21/19 0141 02/21/19 0652      Medications   Scheduled Meds: . folic acid  1 mg Per Tube Daily  . insulin aspart  0-9 Units Subcutaneous Q4H  . insulin glargine  6 Units Subcutaneous Daily  . mouth rinse  15 mL Mouth Rinse BID  . multivitamin with minerals  1 tablet Oral Daily  . pantoprazole (PROTONIX) IV  40 mg Intravenous Q12H  . QUEtiapine  25 mg Per Tube QHS  . sodium chloride flush  10-40 mL  Intracatheter Q12H  . thiamine  100 mg Per Tube Daily   Continuous Infusions: . sodium chloride Stopped (02/22/19 1223)  . dextrose 5 % with KCl 20 mEq / L 100 mL/hr at 02/26/19 0757  . metronidazole 500 mg (02/26/19 0931)   PRN Meds:.sodium chloride, fentaNYL (SUBLIMAZE) injection, sodium chloride flush   Data Review:   Micro Results Recent Results (from the past 240 hour(s))  MRSA PCR Screening     Status: None   Collection Time: 02/20/19 10:07 PM  Result Value Ref Range Status   MRSA by PCR NEGATIVE NEGATIVE Final    Comment:        The GeneXpert MRSA Assay (FDA approved for NASAL specimens only), is one component of a comprehensive MRSA colonization surveillance program. It is not intended to diagnose MRSA infection nor to guide or monitor treatment for MRSA infections. Performed at [facility], [address], [place], [place] [address]   Urine Culture     Status: None   Collection Time: 02/21/19  1:44 AM  Result Value Ref Range Status   Specimen Description   Final    URINE, RANDOM Performed at [facility], [address] [place], [place] [address]    Special Requests   Final    NONE Performed at [facility], [address], [place], [place] [address]    Culture   Final    NO GROWTH Performed at [facility], [address] [place], [place] [address]    Report Status 02/22/2019 FINAL  Final  CULTURE, BLOOD (ROUTINE X 2) w Reflex to ID Panel     Status: None   Collection Time: 02/21/19  2:19 AM  Result Value Ref Range Status   Specimen Description BLOOD BLOOD LEFT FOREARM  Final  Special Requests   Final    BOTTLES DRAWN AEROBIC ONLY Blood Culture results may not be optimal due to an inadequate volume of blood received in culture bottles   Culture   Final    NO GROWTH 5 DAYS Performed at [facility], [address] [place], [place] [address]    Report Status 02/26/2019 FINAL  Final  CULTURE, BLOOD  (ROUTINE X 2) w Reflex to ID Panel     Status: None   Collection Time: 02/21/19  2:19 AM  Result Value Ref Range Status   Specimen Description BLOOD BLOOD LEFT WRIST  Final   Special Requests   Final    BOTTLES DRAWN AEROBIC ONLY Blood Culture results may not be optimal due to an inadequate volume of blood received in culture bottles   Culture   Final    NO GROWTH 5 DAYS Performed at [facility], [address], [place], [place] [address]    Report Status 02/26/2019 FINAL  Final    Radiology Reports Dg Abd 1 View  Result Date: 02/21/2019 CLINICAL DATA:  NG tube placement EXAM: ABDOMEN - 1 VIEW COMPARISON:  02/21/2019 FINDINGS: Esophageal tube tip is in the left upper quadrant. Airspace disease at the left base. Mild diffuse gaseous dilatation of the bowel IMPRESSION: Esophageal tube tip is in the left upper quadrant of the abdomen, projecting over the proximal stomach Electronically Signed   By: Jasmine Pang M.D.   On: 02/21/2019 22:38   Dg Abd 1 View  Result Date: 02/21/2019 CLINICAL DATA:  OG tube placement EXAM: ABDOMEN - 1 VIEW COMPARISON:  02/21/2019 FINDINGS: Esophageal tube is looped back upon itself over the distal esophagus. Diffuse mild air distention of the bowel. Infiltrate at the left base. IMPRESSION: Esophageal tube looped back upon itself over the distal esophagus. Electronically Signed   By: Jasmine Pang M.D.   On: 02/21/2019 22:37   Dg Abd 1 View  Result Date: 02/21/2019 CLINICAL DATA:  Enteric tube placement. EXAM: ABDOMEN - 1 VIEW COMPARISON:  Abdominal x-ray from yesterday. FINDINGS: Enteric tube looped in the stomach with the tip near the gastric fundus. Left femoral approach central venous catheter with tip in the left common iliac vein. Paucity of bowel gas. No evidence of obstruction. Possible 3 mm right renal calculus. No acute osseous abnormality. IMPRESSION: 1. Enteric tube looped in the stomach. 2. Left femoral approach central venous catheter tip in  the left common iliac vein. 3. No acute findings. Electronically Signed   By: Obie Dredge M.D.   On: 02/21/2019 14:25   Dg Chest Port 1 View  Result Date: 02/22/2019 CLINICAL DATA:  58 year old female with respiratory failure. Aspiration of bloody gastric contents. EXAM: PORTABLE CHEST 1 VIEW COMPARISON:  420 through 20 and earlier. FINDINGS: Stable endotracheal tube tip in good position. Enteric tube courses to the midline, tip not included. Mediastinal contours remain normal. Mildly lower lung volumes. Confluent bilateral perihilar and lower lung airspace opacity with retrocardiac air bronchograms. Increased left lower lobe consolidation since yesterday with some obscuration of the left hemidiaphragm. No superimposed pneumothorax. No pleural effusion is evident. Upper lung pulmonary vascularity appears normal. IMPRESSION: 1. Stable lines and tubes. 2. Confluent bilateral perihilar and lower lung pneumonia with increased left lower lobe consolidation since yesterday. Electronically Signed   By: Odessa Fleming M.D.   On: 02/22/2019 05:51   Dg Chest Port 1 View  Result Date: 02/21/2019 CLINICAL DATA:  Hematemesis. EXAM: PORTABLE CHEST 1 VIEW COMPARISON:  Chest x-ray from same day at 7:16. FINDINGS: Interval placement of an endotracheal tube with the tip 2.0 cm above the carina. Enteric tube coiled within the stomach. The heart size and mediastinal contours are within normal limits. Normal pulmonary vascularity. New patchy perihilar and bilateral lower lobe consolidations. No pleural effusion or pneumothorax. No acute osseous abnormality. IMPRESSION: 1. Appropriately positioned endotracheal and enteric tubes. 2. New patchy perihilar and bilateral lower lobe consolidations, concerning for aspiration. Electronically Signed   By: Obie Dredge M.D.   On: 02/21/2019 14:22   Dg Chest Port 1 View  Result Date: 02/21/2019 CLINICAL DATA:  Leukocytosis. EXAM: PORTABLE CHEST 1 VIEW COMPARISON:  Radiographs of February 20, 2019. FINDINGS: The heart size and mediastinal contours are within normal limits. Both lungs are clear. The visualized skeletal structures are unremarkable. IMPRESSION: No active disease. Electronically Signed   By: Lupita Raider M.D.   On: 02/21/2019 08:17   Dg Abdomen Acute W/chest  Result Date: 02/20/2019 CLINICAL DATA:  Abdominal pain EXAM: DG ABDOMEN ACUTE W/ 1V CHEST COMPARISON:  None. FINDINGS: Moderate stool burden in the colon. The bowel gas pattern is normal. There is no evidence of free intraperitoneal air. No suspicious radio-opaque calculi or other significant radiographic abnormality is seen. Heart size and mediastinal contours are within normal limits. Both lungs are clear. IMPRESSION: Moderate stool burden.  No acute findings. Electronically Signed   By: Charlett Nose M.D.   On: 02/20/2019 20:05     CBC Recent Labs  Lab 02/21/19 0551  02/22/19 1003  02/24/19 0453 02/24/19 0905 02/24/19 2051 02/25/19 0700 02/25/19 1651 02/26/19 0429  WBC 21.6*   < > 23.4*   < > 17.7*  --  25.9* 24.0* 24.1* 14.2*  HGB 8.5*   < > 10.0*   < > 6.5* 8.5* 8.5* 8.1* 9.2* 8.8*  HCT 26.4*   < > 27.2*   < > 18.7* 23.9* 24.2* 22.9* 26.9* 25.4*  PLT 298   < > 146*   < > 70*  --  80* 81* 106* 119*  MCV 92.3   < > 81.2   < > 85.8  --  85.5 86.4 87.3 86.7  MCH 29.7   < > 29.9   < > 29.8  --  30.0 30.6 29.9 30.0  MCHC 32.2   < > 36.8*   < > 34.8  --  35.1 35.4 34.2 34.6  RDW 13.1   < > 14.1   < > 15.3  --  14.5 14.6 14.5 14.1  LYMPHSABS 2.5  --  3.3  --   --   --   --   --   --   --   MONOABS 1.0  --  0.3  --   --   --   --   --   --   --   EOSABS 0.0  --  0.3  --   --   --   --   --   --   --   BASOSABS 0.0  --  0.1  --   --   --   --   --   --   --    < > = values in this interval not displayed.    Chemistries  Recent Labs  Lab 02/20/19 1913  02/22/19 0424  02/23/19 0533  02/24/19 0047 02/24/19 0453 02/24/19 2204 02/25/19 0536 02/26/19 0519  NA 135   < > 158*   < > 152*   < >  146*  146* 144 145 146*  K 6.5*   < > 3.7   < > 3.9   < > 3.6 3.3* 3.3* 3.8 3.8  CL 98   < > 116*   < > 116*   < > 112* 112* 108 110 114*  CO2 9*   < > 30   < > 29   < > GLUCOSE 866*   < > 340*   < > 178*   < > 237* 214* 109* 150* 146*  BUN 34*   < > 56*   < > 51*   < > 41* 40* 33* 29* 20  CREATININE 2.76*   < > 2.30*   < > 2.71*   < > 2.63* 2.39* 2.23* 2.15* 1.91*  CALCIUM 9.2   < > 6.2*   < > 6.3*   < > 6.4* 6.5* 7.3* 7.3* 7.7*  MG  --    < > 1.4*  --  2.1  --  1.8 1.8 1.8  --  1.7  AST 18  --  93*  --   --   --   --   --   --   --  103*  ALT 11  --  43  --   --   --   --   --   --   --  64*  ALKPHOS 113  --  39  --   --   --   --   --   --   --  65  BILITOT 1.1  --  1.0  --   --   --   --   --   --   --  0.9   < > = values in this interval not displayed.   ------------------------------------------------------------------------------------------------------------------ estimated creatinine clearance is 21.9 mL/min (A) (by C-G formula based on SCr of 1.91 mg/dL (H)). ------------------------------------------------------------------------------------------------------------------ No results for input(s): HGBA1C in the last 72 hours. ------------------------------------------------------------------------------------------------------------------ No results for input(s): CHOL, HDL, LDLCALC, TRIG, CHOLHDL, LDLDIRECT in the last 72 hours. ------------------------------------------------------------------------------------------------------------------ No results for input(s): TSH, T4TOTAL, T3FREE, THYROIDAB in the last 72 hours.  Invalid input(s): FREET3 ------------------------------------------------------------------------------------------------------------------ No results for input(s): VITAMINB12, FOLATE, FERRITIN, TIBC, IRON, RETICCTPCT in the last 72 hours.  Coagulation profile Recent Labs  Lab 02/21/19 0713  INR 1.5*    No results for input(s): DDIMER in the last  72 hours.  Cardiac Enzymes Recent Labs  Lab 02/21/19 2156 02/22/19 0424 02/22/19 1003  TROPONINI 0.26* 0.46* 0.42*   ------------------------------------------------------------------------------------------------------------------ Invalid input(s): POCBNP    Assessment & Plan  Patient is 58 year old admitted with nausea and vomiting  #1 DKA (diabetic ketoacidoses) (HCC) -resolved continue Lantus and sliding scale insulin   #2. upper GI bleed .  Patient had endoscopy 2 times on April 22, April 25, CD from yesterday showed worsening gastric ulcer status post epinephrine injection, now hemoglobin is stable.  Continue PPIs.  #3 hypotension related to likely upper GI bleed status post transfusion, hypotension improved. Continue stress dose steroids and antibiotics  #4 AKI (acute kidney injury) (HCC)  Slowly improving.   #5 leukocytosis possible reactive better slightly today  #6. anemia due to upper GI bleed; that is post blood transfusion, endoscopies, no hemoglobin stable.    7. hypokalemia, ; replaced, improved #8. hypovolemic shock, resolving,.  Likely transfer to floor. miscellaneous SCDs for DVT prophylaxis COVID-19 has been ruled out.     Code Status Orders  (From admission, onward)  Start     Ordered   02/20/19 2207  Full code  Continuous     02/20/19 2206        Code Status History    This patient has a current code status but no historical code status.    Advance Directive Documentation     Most Recent Value  Type of Advance Directive  Healthcare Power of Attorney [Daughter- Tasha]  Pre-existing out of facility DNR order (yellow form or pink MOST form)  -  "MOST" Form in Place?  -           Consults pulm ccm  DVT Prophylaxis scd's  Lab Results  Component Value Date   PLT 119 (L) 02/26/2019     Time Spent in minutes  35min spent   Katha HammingSnehalatha Benen Weida M.D on 02/26/2019 at 12:55 PM  Between 7am to 6pm - Pager -  [phone]  After 6pm go to [email or web address] - Social research officer, governmentpassword EPAS [facility]  [employer]   Office  [phone]

## 2019-02-26 NOTE — Progress Notes (Signed)
Pastoral Care Visit   02/26/19 1430  Clinical Encounter Type  Visited With Patient  Visit Type Follow-up  Referral From Nurse  Consult/Referral To Chaplain  Spiritual Encounters  Spiritual Needs Emotional   Chap requested by RN to visit.  Pt was sitting up in bed, eyes open.  Mirna Mires identified self.  Pt was only mildly responsive. Chap asked if pt needed anything.  Pt didn't respond. Asked if pt would appreciate prayer.  Pt said "Amen."  Mirna Mires prayed with pt and let her know chaps available 24-7.   Milinda Antis, [address]

## 2019-02-26 NOTE — Progress Notes (Signed)
CRITICAL CARE NOTE          SUBJECTIVE FINDINGS & SIGNIFICANT EVENTS    Prognosis is guarded, patient with anasarca, UGIB, active melena Improved clinically  SIGNIFICANT EVENTS 4/22 admitted for DKA nad severe hemmorhagic shock 4/22 intubated, aspiration of gastric contents and blood #8 ETT and LEFT FEM LINE PLACED 4/22 severe small NON bleeding doudenal ulcers s/p EGD 4/23 remains on vasopressors, multiorgan failure 4/24 remains on vasopressors, multiorgan failure 4/25 oozing linear gastric ulcer-injected and cauterized  PAST MEDICAL HISTORY   Past Medical History:  Diagnosis Date  . Diabetes mellitus without complication [facility])      SURGICAL HISTORY   Past Surgical History:  Procedure Laterality Date  . ABDOMINAL HYSTERECTOMY    . CHOLECYSTECTOMY    . ESOPHAGOGASTRODUODENOSCOPY N/A 02/21/2019   Procedure: ESOPHAGOGASTRODUODENOSCOPY (EGD);  Surgeon: [place], Boykin Nearing, MD;  Location: [facility] ENDOSCOPY;  Service: Gastroenterology;  Laterality: N/A;  ICU     FAMILY HISTORY   History reviewed. No pertinent family history.   SOCIAL HISTORY   Social History   Tobacco Use  . Smoking status: Never Smoker  . Smokeless tobacco: Never Used  Substance Use Topics  . Alcohol use: Not Currently  . Drug use: Never     MEDICATIONS   Current Medication:  Current Facility-Administered Medications:  .  0.9 %  sodium chloride infusion, , Intravenous, PRN, Eugenie Norrie, NP, Stopped at 02/22/19 1223 .  dextrose 5 % with KCl 20 mEq / L  infusion, 20 mEq, Intravenous, Continuous, Kasa, Kurian, MD, Last Rate: 100 mL/hr at 02/26/19 0757 .  fentaNYL (SUBLIMAZE) injection 12.5-25 mcg, 12.5-25 mcg, Intravenous, Q2H PRN, Tukov-Yual, Magdalene S, NP, 12.5 mcg at 02/26/19 0735 .  folic acid (FOLVITE) tablet 1  mg, 1 mg, Per Tube, Daily, Kasa, Kurian, MD, 1 mg at 02/24/19 1109 .  insulin aspart (novoLOG) injection 0-9 Units, 0-9 Units, Subcutaneous, Q4H, Eugenie Norrie, NP, 1 Units at 02/26/19 0735 .  insulin glargine (LANTUS) injection 6 Units, 6 Units, Subcutaneous, Daily, Eugenie Norrie, NP, 6 Units at 02/25/19 0944 .  MEDLINE mouth rinse, 15 mL, Mouth Rinse, BID, Kasa, Kurian, MD, 15 mL at 02/25/19 2127 .  metroNIDAZOLE (FLAGYL) IVPB 500 mg, 500 mg, Intravenous, Q8H, Konidena, Snehalatha, MD, Last Rate: 100 mL/hr at 02/26/19 0211, 500 mg at 02/26/19 0211 .  multivitamin liquid 15 mL, 15 mL, Per Tube, Daily, Erin Fulling, MD, 15 mL at 02/24/19 1109 .  pantoprazole (PROTONIX) injection 40 mg, 40 mg, Intravenous, Q12H, Kasa, Kurian, MD, 40 mg at 02/25/19 2126 .  QUEtiapine (SEROQUEL) tablet 25 mg, 25 mg, Per Tube, QHS, Erin Fulling, MD, Stopped at 02/25/19 2124 .  sodium chloride flush (NS) 0.9 % injection 10-40 mL, 10-40 mL, Intracatheter, Q12H, Harlon Ditty D, NP, 10 mL at 02/25/19 2127 .  sodium chloride flush (NS) 0.9 % injection 10-40 mL, 10-40 mL, Intracatheter, PRN, Harlon Ditty D, NP .  thiamine (VITAMIN B-1) tablet 100 mg, 100 mg, Per Tube, Daily, Kasa, Kurian, MD, 100 mg at 02/24/19 1109    ALLERGIES   Patient has no known allergies.    REVIEW OF SYSTEMS    10 point ROS conducted and is negative except as per subjective findings  PHYSICAL EXAMINATION   Vitals:   02/26/19 0700 02/26/19 0742  BP: 106/68   Pulse: 74 83  Resp: (!) 22 (!) 22  Temp:  98.8 F (37.1 C)  SpO2: 100% 98%    GENERAL:NAD HEAD: Normocephalic, atraumatic.  EYES: Pupils equal, round, reactive to light.  No scleral icterus.  MOUTH: Moist mucosal membrane. NECK: Supple. No thyromegaly. No nodules. No JVD.  PULMONARY: Mild crackles at bases, no wheezing no rhonchi CARDIOVASCULAR: S1 and S2. Regular rate and rhythm. No murmurs, rubs, or gallops.  GASTROINTESTINAL: Soft, nontender, non-distended.  No masses. Positive bowel sounds. No hepatosplenomegaly.  MUSCULOSKELETAL:  + anasarca NEUROLOGIC: Mild distress due to acute illness SKIN:intact,warm,dry   LABS AND IMAGING     -I personally reviewed most recent blood work, imaging and microbiology - significant findings today are hypernatremia, AKI, anemia, leukocytosis  LAB RESULTS: Recent Labs  Lab 02/24/19 2204 02/25/19 0536 02/26/19 0519  NA 144 145 146*  K 3.3* 3.8 3.8  CL 108 110 114*  CO2 31 31 29   BUN 33* 29* 20  CREATININE 2.23* 2.15* 1.91*  GLUCOSE 109* 150* 146*   Recent Labs  Lab 02/25/19 0700 02/25/19 1651 02/26/19 0429  HGB 8.1* 9.2* 8.8*  HCT 22.9* 26.9* 25.4*  WBC 24.0* 24.1* 14.2*  PLT 81* 106* 119*     IMAGING RESULTS: No results found.      ASSESSMENT AND PLAN    -Multidisciplinary rounds held today  Acute Hypoxic Respiratory Failure - resolved      - due to aspiration of GI contents       -Now stabalized on room air       - background of pulmonary edema will diurese gently today       -sucessfully liberated off of mechanical ventilation       -central line - Left femoral line will remove post PIV insertion   Acute blood loss anemia      Due to UGIB   -s/p GI evaluation -  Plan to advance diet today,  Plan for Hpylori testing per GI,  Acid suppresion with PPI bid -IVF 100cc/hr -stopping D5w now - eating PO diet now      CARDIAC- ICU monitoring  Renal Failure-most likely due to ATN        -KDIGO stage 3 -follow chem 7 -follow UO -continue Foley Catheter-assess need daily    ID -continue IV abx as prescibed -follow up cultures  GI/Nutrition GI PROPHYLAXIS as indicated DIET-->TF's as tolerated Constipation protocol as indicated  ENDO - ICU hypoglycemic\Hyperglycemia protocol -check FSBS per protocol   ELECTROLYTES -follow labs as needed -replace as needed -pharmacy consultation   DVT/GI PRX ordered -SCDs  TRANSFUSIONS AS NEEDED MONITOR FSBS ASSESS  the need for LABS as needed   Critical care provider statement:    Critical care time (minutes):  34   Critical care time was exclusive of:  Separately billable procedures and treating other patients   Critical care was necessary to treat or prevent imminent or life-threatening deterioration of the following conditions:  Acute hypoxemic respiratory failure, Acute blood loss anemia, acute kidney injury, aspiration pneumonia, multiple comorbid conditions.    Critical care was time spent personally by me on the following activities:  Development of treatment plan with patient or surrogate, discussions with consultants, evaluation of patient's response to treatment, examination of patient, obtaining history from patient or surrogate, ordering and performing treatments and interventions, ordering and review of laboratory studies and re-evaluation of patient's condition.  I assumed direction of critical care for this patient from another provider in my specialty: no    This document was prepared using Dragon voice recognition software and may include unintentional dictation errors.    Vida RiggerFuad Greer Wainright, M.D.  Division of Pulmonary &  [place]

## 2019-02-26 NOTE — Evaluation (Signed)
Occupational Therapy Evaluation Patient Details Name: Bethany James MRN: [id number] DOB: 06/16/1961 Today's Date: 02/26/2019    History of Present Illness Bethany James is a 58yo female who comes to [facility] 4/21 c hematemesis, BG in 800s upon arrival, pt admitted with DKA. Pt was intubated on 4/22, upper GI endoscopy on 4/22, 4/23, 4/25. Pt extubated on 4/26, now on RA. Pt noted to have gastric ulcer and duodenal ulcer, now followed by GI.     Clinical Impression   Pt. Presents with weakness, limited activity tolerance, and limited functional mobility which limits her ability to complete basic ADL and IADL functioning. Pt. Resides at home with her daughter. Pt. Required assist from her daughter for basic ADL, and IADL tasks including: meal preparation, medication management, and performing home management, and household tasks. Pt. Tolerated BUE ROM. Pt. education was provided about intrinsic stretches for the left hand in preparation for formulating a fist secondary to edema. Pt. Education was provided about positioning of the UEs. Pt. Could benefit from OT services for ADL training, A/E training, positioning, UE there. Ex., and pt. education about home modification, and DME. Pt. would benefitt from SNF level of care upon discharge. Pt. could benefit from follow-up OT services at discharge.    Follow Up Recommendations  SNF    Equipment Recommendations    BSCommode   Recommendations for Other Services       Precautions / Restrictions Precautions Precautions: Fall Precaution Comments: Watch LUE, very edematous and painful Restrictions Weight Bearing Restrictions: No      Mobility Bed Mobility  Total Assist    Transfers Overall transfer level: Needs assistance Equipment used: None(dependent transfer) Transfers: Sit to/from Stand;Stand Pivot Transfers Sit to Stand: Max assist Stand pivot transfers: (does well stepping while supported physically)       General transfer comment: Per  PT report    Balance Overall balance assessment: Needs assistance   Sitting balance-Leahy Scale: Zero       Standing balance-Leahy Scale: Zero                             ADL either performed or assessed with clinical judgement   ADL Overall ADL's : Needs assistance/impaired     Grooming: Moderate assistance   Upper Body Bathing: Maximal assistance   Lower Body Bathing: Maximal assistance   Upper Body Dressing : Maximal assistance   Lower Body Dressing: Maximal assistance                       Vision         Perception     Praxis      Pertinent Vitals/Pain Pain Assessment: 0-10 Faces Pain Scale: Hurts little more Pain Location: gestures to left hand/wrist, quite edematous Pain Intervention(s): Limited activity within patient's tolerance;Monitored during session;Repositioned     Hand Dominance Right   Extremity/Trunk Assessment Upper Extremity Assessment Upper Extremity Assessment: Generalized weakness      Cervical / Trunk Assessment Cervical / Trunk Assessment: (difficulty with trunk control at EOB )   Communication     Cognition Arousal/Alertness: Awake/alert Behavior During Therapy: Flat affect Overall Cognitive Status: No family/caregiver present to determine baseline cognitive functioning                                 General Comments: limited awareness of situation    General Comments  Exercises     Shoulder Instructions      Home Living Family/patient expects to be discharged to:: Private residence Living Arrangements: Children   Type of Home: Mobile home Home Access: Level entry     Home Layout: One level               Home Equipment: None          Prior Functioning/Environment Level of Independence: Independent with assistive device(s);Needs assistance  Gait / Transfers Assistance Needed: DTR provides transportation ADL's / Homemaking Assistance Needed: DTR assists with  bathing, dressing, toiletting            OT Problem List: Decreased strength;Decreased activity tolerance;Decreased knowledge of use of DME or AE;Impaired UE functional use;Decreased range of motion      OT Treatment/Interventions: Self-care/ADL training;Therapeutic exercise;Neuromuscular education;Patient/family education;Therapeutic activities;DME and/or AE instruction;Cognitive remediation/compensation    OT Goals(Current goals can be found in the care plan section) Acute Rehab OT Goals Patient Stated Goal: regain strength and mobility to PLOF   OT Frequency: Min 2X/week   Barriers to D/C:            Co-evaluation              AM-PAC OT "6 Clicks" Daily Activity     Outcome Measure Help from another person eating meals?: A Lot Help from another person taking care of personal grooming?: A Lot Help from another person toileting, which includes using toliet, bedpan, or urinal?: Total Help from another person bathing (including washing, rinsing, drying)?: Total Help from another person to put on and taking off regular upper body clothing?: A Lot Help from another person to put on and taking off regular lower body clothing?: A Lot 6 Click Score: 10   End of Session    Activity Tolerance: Patient limited by fatigue Patient left: in bed  OT Visit Diagnosis: Muscle weakness (generalized) (M62.81)                Time: [id number] OT Time Calculation (min): 30 min Charges:  OT General Charges $OT Visit: 1 Visit OT Evaluation $OT Eval Low Complexity: 1 Low  Olegario MessierElaine Adileny Delon, MS, OTR/L  Olegario Messierlaine Kashayla Ungerer 02/26/2019, 4:23 PM

## 2019-02-27 ENCOUNTER — Encounter: Payer: Self-pay | Admitting: Registered Nurse

## 2019-02-27 LAB — CBC WITH DIFFERENTIAL/PLATELET
Abs Immature Granulocytes: 0.52 10*3/uL — ABNORMAL HIGH (ref 0.00–0.07)
Basophils Absolute: 0 10*3/uL (ref 0.0–0.1)
Basophils Relative: 0 %
Eosinophils Absolute: 0.3 10*3/uL (ref 0.0–0.5)
Eosinophils Relative: 3 %
HCT: 27.7 % — ABNORMAL LOW (ref 36.0–46.0)
Hemoglobin: 9.1 g/dL — ABNORMAL LOW (ref 12.0–15.0)
Immature Granulocytes: 4 %
Lymphocytes Relative: 32 %
Lymphs Abs: 3.8 10*3/uL (ref 0.7–4.0)
MCH: 30.1 pg (ref 26.0–34.0)
MCHC: 32.9 g/dL (ref 30.0–36.0)
MCV: 91.7 fL (ref 80.0–100.0)
Monocytes Absolute: 2.2 10*3/uL — ABNORMAL HIGH (ref 0.1–1.0)
Monocytes Relative: 19 %
Neutro Abs: 5.1 10*3/uL (ref 1.7–7.7)
Neutrophils Relative %: 42 %
Platelets: 177 10*3/uL (ref 150–400)
RBC: 3.02 MIL/uL — ABNORMAL LOW (ref 3.87–5.11)
RDW: 14.4 % (ref 11.5–15.5)
WBC: 12 10*3/uL — ABNORMAL HIGH (ref 4.0–10.5)
nRBC: 0.2 % (ref 0.0–0.2)

## 2019-02-27 LAB — BASIC METABOLIC PANEL
Anion gap: 7 (ref 5–15)
BUN: 18 mg/dL (ref 6–20)
CO2: 32 mmol/L (ref 22–32)
Calcium: 7.6 mg/dL — ABNORMAL LOW (ref 8.9–10.3)
Chloride: 107 mmol/L (ref 98–111)
Creatinine, Ser: 1.75 mg/dL — ABNORMAL HIGH (ref 0.44–1.00)
GFR calc Af Amer: 37 mL/min — ABNORMAL LOW (ref >60)
GFR calc non Af Amer: 32 mL/min — ABNORMAL LOW (ref >60)
Glucose, Bld: 89 mg/dL (ref 70–99)
Potassium: 3.5 mmol/L (ref 3.5–5.1)
Sodium: 146 mmol/L — ABNORMAL HIGH (ref 135–145)

## 2019-02-27 LAB — GLUCOSE, CAPILLARY
Glucose-Capillary: 137 mg/dL — ABNORMAL HIGH (ref 70–99)
Glucose-Capillary: 209 mg/dL — ABNORMAL HIGH (ref 70–99)
Glucose-Capillary: 251 mg/dL — ABNORMAL HIGH (ref 70–99)
Glucose-Capillary: 295 mg/dL — ABNORMAL HIGH (ref 70–99)
Glucose-Capillary: 83 mg/dL (ref 70–99)
Glucose-Capillary: 94 mg/dL (ref 70–99)

## 2019-02-27 LAB — PHOSPHORUS: Phosphorus: 3.6 mg/dL (ref 2.5–4.6)

## 2019-02-27 LAB — MAGNESIUM: Magnesium: 2.1 mg/dL (ref 1.7–2.4)

## 2019-02-27 MED ORDER — POTASSIUM CHLORIDE CRYS ER 20 MEQ PO TBCR
40.0000 meq | EXTENDED_RELEASE_TABLET | Freq: Once | ORAL | Status: AC
  Administered 2019-02-27: 40 meq via ORAL
  Filled 2019-02-27: qty 2

## 2019-02-27 NOTE — Progress Notes (Signed)
PHARMACY CONSULT NOTE - FOLLOW UP  Pharmacy Consult for Electrolyte Monitoring and Replacement   Recent Labs: Potassium (mmol/L)  Date Value  02/27/2019 3.5   Magnesium (mg/dL)  Date Value  16/08/9603 2.1   Calcium (mg/dL)  Date Value  54/07/8118 7.6 (L)   Albumin (g/dL)  Date Value  14/78/2956 1.9 (L)   Phosphorus (mg/dL)  Date Value  21/30/8657 3.6   Sodium (mmol/L)  Date Value  02/27/2019 146 (H)     Assessment: Patient was admitted for hematemesis and EGD performed found oozing gastric ulcer.  Goal of Therapy:  Phosphorus 2.5 - 4.5, Potassium ~4.0, Magnesium ~2.0  Plan:  Patient received 60 mg IV of furosemide yesterday. Ordered KCl 40 mEq PO x 1. Will order electrolytes with AM labs.  Pharmacy will continue to monitor.   Mauri Reading, PharmD Pharmacy Resident  02/27/2019 2:11 PM

## 2019-02-27 NOTE — Progress Notes (Signed)
Physical Therapy Treatment Patient Details Name: Bethany James MRN: [id number] DOB: Jan 14, 1961 Today's Date: 02/27/2019    History of Present Illness Bethany James is a 57yo female who comes to [facility] 4/21 c hematemesis, BG in 800s upon arrival, pt admitted with DKA. Pt was intubated on 4/22, upper GI endoscopy on 4/22, 4/23, 4/25. Pt extubated on 4/26, now on RA. Pt noted to have gastric ulcer and duodenal ulcer, now followed by GI.      PT Comments    Pt more alert this date, remains with flat affect, delayed response time, mild impulsivity, and limited capacity as a historian minA to EOB, modA for several transfers. 2 episodes of diarrhea in session, RN/CNA assisting with pericare. Pt vocalizes high motivation level, but does not seem fully aware for limitations and impairments at this time. Pt ataxic, unable to control trunk for sitting independently at EOB, continued posterior lean when in standing, physical assist required for balance throughout, except when reclined. Unable to progress to AMB this date.    Follow Up Recommendations  SNF;Supervision for mobility/OOB     Equipment Recommendations       Recommendations for Other Services       Precautions / Restrictions Precautions Precautions: Fall Precaution Comments: impulsive; ataxic; Watch LUE, very edematous and painful;  Restrictions Weight Bearing Restrictions: No    Mobility  Bed Mobility Overal bed mobility: Needs Assistance Bed Mobility: Supine to Sit     Supine to sit: Min assist     General bed mobility comments: much stronger today, but trunk remains weak, unable to come to full upright at EOB without assistance. DIzzy intitially. Unable to sit independently without support  Transfers Overall transfer level: Needs assistance Equipment used: Rolling walker (2 wheeled);1 person hand held assist Transfers: Sit to/from [employer] Sit to Stand: From elevated surface;Mod assist(performed 6x in  session) Stand pivot transfers: Mod assist(performed 3x in session)       General transfer comment: wide based feet, posterior lean, unable to use RW effectively for balance without support  Ambulation/Gait Ambulation/Gait assistance: (deferred, not appropriate at this time)               Stairs             Wheelchair Mobility    Modified Rankin (Stroke Patients Only)       Balance Overall balance assessment: Needs assistance   Sitting balance-Leahy Scale: Zero     Standing balance support: Bilateral upper extremity supported;During functional activity Standing balance-Leahy Scale: Zero                              Cognition Arousal/Alertness: Awake/alert Behavior During Therapy: Flat affect;Impulsive;WFL for tasks assessed/performed Overall Cognitive Status: No family/caregiver present to determine baseline cognitive functioning                                 General Comments: more alert today, still having trouble answering questions about PLOF; tangental at times., Delayed response. Does not provide detail in response to followup questions.       Exercises      General Comments        Pertinent Vitals/Pain Pain Assessment: Faces Faces Pain Scale: Hurts little more Pain Location: BLEs Pain Descriptors / Indicators: Aching Pain Intervention(s): Limited activity within patient's tolerance;Monitored during session    Home Living  Prior Function            PT Goals (current goals can now be found in the care plan section) Acute Rehab PT Goals Patient Stated Goal: regain strength and mobility to PLOF  PT Goal Formulation: With patient Time For Goal Achievement: 03/12/19 Potential to Achieve Goals: Good Progress towards PT goals: Progressing toward goals    Frequency    Min 2X/week      PT Plan Current plan remains appropriate    Co-evaluation              AM-PAC PT "6  Clicks" Mobility   Outcome Measure  Help needed turning from your back to your side while in a flat bed without using bedrails?: A Little Help needed moving from lying on your back to sitting on the side of a flat bed without using bedrails?: A Little Help needed moving to and from a bed to a chair (including a wheelchair)?: A Lot Help needed standing up from a chair using your arms (e.g., wheelchair or bedside chair)?: A Lot Help needed to walk in hospital room?: A Lot Help needed climbing 3-5 steps with a railing? : Total 6 Click Score: 13    End of Session   Activity Tolerance: Patient tolerated treatment well;Treatment limited secondary to medical complications (Comment)(diarrhea twice during session. ) Patient left: with nursing/sitter in room(on BSC with CNA) Nurse Communication: Mobility status PT Visit Diagnosis: Unsteadiness on feet (R26.81);Other abnormalities of gait and mobility (R26.89);Muscle weakness (generalized) (M62.81);Difficulty in walking, not elsewhere classified (R26.2)     Time: [id number] PT Time Calculation (min) (ACUTE ONLY): 34 min  Charges:  $Therapeutic Exercise: 8-22 mins $Neuromuscular Re-education: 8-22 mins                     5:02 PM, 02/27/19 Rosamaria LintsAllan C Buccola, PT, DPT Physical Therapist - [facility]  [phone] (ASCOM)     Buccola,Allan C 02/27/2019, 4:59 PM

## 2019-02-27 NOTE — Progress Notes (Signed)
Occupational Therapy Treatment Patient Details Name: Bethany James MRN: [id number] DOB: 1961/03/11 Today's Date: 02/27/2019    History of present illness Bethany OdeaJamica Ederer is a 58yo female who [facility] c hematemesis, BG in 800s upon arrival, pt admitted with DKA. Pt was intubated on 4/22, upper GI endoscopy on 4/22, 4/23, 4/25. Pt extubated on 4/26, now on RA. Pt noted to have gastric ulcer and duodenal ulcer, now followed by GI.     OT comments  Pt seen for OT tx this date. Pt endorses 9/10 pain, B hands, BLE, and abdomen. Despite pain, pt able to use BUE and BLE to assist her in scooting up in bed without assist from therapist. Retrograde massage provided to B hands and instruction in positioning to minimize edema and improve pain. Pt verbalized understanding but did not return demo. Visibly notable decrease, although slight, in edema to B hands. Pt appreciative, RN notified of pt's pain and request for pain meds. Will continue to progress.   Follow Up Recommendations  SNF    Equipment Recommendations       Recommendations for Other Services      Precautions / Restrictions Precautions Precautions: Fall Precaution Comments: Watch LUE, very edematous and painful Restrictions Weight Bearing Restrictions: No       Mobility Bed Mobility Overal bed mobility: Needs Assistance             General bed mobility comments: with cues for hand/foot placement, pt able to scoot herself up in bed without physical assist  Transfers                      Balance                                           ADL either performed or assessed with clinical judgement   ADL                                         General ADL Comments: pt endorses difficulty utilizing hands to grasp items 2/2 pain/edema     Vision Patient Visual Report: No change from baseline     Perception     Praxis      Cognition Arousal/Alertness:  Awake/alert Behavior During Therapy: Flat affect Overall Cognitive Status: No family/caregiver present to determine baseline cognitive functioning                                 General Comments: alert and oriented, follows commands        Exercises Other Exercises Other Exercises: retrograde massage and instruction in technique for pt for L and R hands, edema noted L>R, both painful, visible but slight reduction after treatment Other Exercises: pt educated in BUE positioning with pillows to elevate and reduce edema   Shoulder Instructions       General Comments      Pertinent Vitals/ Pain       Pain Assessment: 0-10 Pain Score: 9  Pain Location: L>R hand, BLEs, abdomen Pain Descriptors / Indicators: Aching Pain Intervention(s): Limited activity within patient's tolerance;Monitored during session;Repositioned;Patient requesting pain meds-RN notified  Home Living  Prior Functioning/Environment              Frequency  Min 2X/week        Progress Toward Goals  OT Goals(current goals can now be found in the care plan section)  Progress towards OT goals: Progressing toward goals  Acute Rehab OT Goals Patient Stated Goal: regain strength and mobility to PLOF   Plan Discharge plan remains appropriate;Frequency remains appropriate    Co-evaluation                 AM-PAC OT "6 Clicks" Daily Activity     Outcome Measure   Help from another person eating meals?: A Lot Help from another person taking care of personal grooming?: A Lot Help from another person toileting, which includes using toliet, bedpan, or urinal?: A Lot Help from another person bathing (including washing, rinsing, drying)?: A Lot Help from another person to put on and taking off regular upper body clothing?: A Lot Help from another person to put on and taking off regular lower body clothing?: A Lot 6 Click Score:  12    End of Session    OT Visit Diagnosis: Muscle weakness (generalized) (M62.81)   Activity Tolerance Patient tolerated treatment well   Patient Left in bed;with call bell/phone within reach;with bed alarm set   Nurse Communication Patient requests pain meds        Time: 1137-1200 OT Time Calculation (min): 23 min  Charges: OT General Charges $OT Visit: 1 Visit OT Treatments $Therapeutic Activity: 23-37 mins  Richrd Prime, MPH, MS, OTR/L ascom [phone] 02/27/19, 12:23 PM

## 2019-02-27 NOTE — Progress Notes (Signed)
[employer] - [facility]                                                                                                                                                                                  Patient Demographics   Azya Barbero, is a 58 y.o. female, DOB - [birth year], ZOX:[id number]  Admit date - 02/20/2019   Admitting Physician Oralia Manis, MD  Outpatient Primary MD for the patient is System, Pcp Not In   LOS - 7  Subjective: Extubated, doing well, is on room air and saturation 95%.  Slightly hypotensive.  Physical therapy saw the patient, recommends skilled nursing facility. Review of Systems:   CONSTITUTIONAL: Intubated Vitals:   Vitals:   02/27/19 0600 02/27/19 0606 02/27/19 0700 02/27/19 0800  BP: (!) 94/57  99/60 (!) 95/57  Pulse: 80 79 84 88  Resp: 19 (!) 21 (!) 22 19  Temp:    98.4 F (36.9 C)  TempSrc:    Oral  SpO2: 97% 97% 98% 95%  Weight:      Height:        Wt Readings from Last 3 Encounters:  02/20/19 48.6 kg  08/13/18 59 kg  04/06/18 71.2 kg     Intake/Output Summary (Last 24 hours) at 02/27/2019 1018 Last data filed at 02/27/2019 0504 Gross per 24 hour  Intake 469.24 ml  Output 1700 ml  Net -1230.76 ml    Physical Exam:   GENERAL: Alert, awake, oriented..  Saturating 98% on room air.  HEAD, EYES, EARS, NOSE AND THROAT: Atraumatic, normocephalic. . Pupils equal and reactive to light. Sclerae anicteric. No conjunctival injection. No oro-pharyngeal erythema.  NECK: Supple. There is no jugular venous distention. No bruits, no lymphadenopathy, no thyromegaly.  HEART: Regular rate and rhythm,. No murmurs, no rubs, no clicks.  LUNGS: Clear to auscultation bilaterally. No rales or rhonchi. No wheezes.  ABDOMEN: Soft, flat, nontender, nondistended. Has good bowel sounds. No hepatosplenomegaly appreciated.  EXTREMITIES: No evidence of any cyanosis, clubbing, or peripheral edema.  +2 pedal and radial pulses bilaterally.   NEUROLOGIC: Focal neurological deficit observed. SKIN: Moist and warm with no rashes appreciated.  Psych: Not anxious, depressed LN: No inguinal LN enlargement    Antibiotics   Anti-infectives (From admission, onward)   Start     Dose/Rate Route Frequency Ordered Stop   02/25/19 1015  metroNIDAZOLE (FLAGYL) IVPB 500 mg     500 mg 100 mL/hr over 60 Minutes Intravenous Every 8 hours 02/25/19 0436     02/25/19 0200  metroNIDAZOLE (FLAGYL) IVPB 500 mg  Status:  Discontinued     500 mg 100 mL/hr over 60 Minutes Intravenous Every 8  hours 02/25/19 0130 02/25/19 0436   02/23/19 1400  erythromycin 200 mg in sodium chloride 0.9 % 100 mL IVPB     200 mg 100 mL/hr over 60 Minutes Intravenous Every 8 hours 02/23/19 1041 02/25/19 2351   02/23/19 1200  Ampicillin-Sulbactam (UNASYN) 3 g in sodium chloride 0.9 % 100 mL IVPB     3 g 200 mL/hr over 30 Minutes Intravenous 2 times daily 02/23/19 1037 02/25/19 2153   02/21/19 1930  metroNIDAZOLE (FLAGYL) IVPB 500 mg  Status:  Discontinued     500 mg 100 mL/hr over 60 Minutes Intravenous Every 6 hours 02/21/19 1923 02/23/19 1037   02/21/19 1400  cefTRIAXone (ROCEPHIN) 2 g in sodium chloride 0.9 % 100 mL IVPB  Status:  Discontinued     2 g 200 mL/hr over 30 Minutes Intravenous Daily 02/21/19 1324 02/23/19 1037   02/21/19 1400  erythromycin 250 mg in sodium chloride 0.9 % 100 mL IVPB     250 mg 100 mL/hr over 60 Minutes Intravenous STAT 02/21/19 1335 02/21/19 1538   02/21/19 0145  ceFEPIme (MAXIPIME) 2 g in sodium chloride 0.9 % 100 mL IVPB  Status:  Discontinued     2 g 200 mL/hr over 30 Minutes Intravenous Every 24 hours 02/21/19 0141 02/21/19 0652      Medications   Scheduled Meds: . folic acid  1 mg Per Tube Daily  . insulin aspart  0-9 Units Subcutaneous Q4H  . insulin glargine  6 Units Subcutaneous Daily  . mouth rinse  15 mL Mouth Rinse BID  . multivitamin with minerals  1 tablet Oral Daily  . pantoprazole (PROTONIX) IV  40 mg  Intravenous Q12H  . Ensure Max Protein  11 oz Oral BID BM  . QUEtiapine  25 mg Per Tube QHS  . sodium chloride flush  10-40 mL Intracatheter Q12H  . thiamine  100 mg Per Tube Daily   Continuous Infusions: . sodium chloride Stopped (02/22/19 1223)  . metronidazole 500 mg (02/27/19 0214)   PRN Meds:.sodium chloride, fentaNYL (SUBLIMAZE) injection, sodium chloride flush   Data Review:   Micro Results Recent Results (from the past 240 hour(s))  MRSA PCR Screening     Status: None   Collection Time: 02/20/19 10:07 PM  Result Value Ref Range Status   MRSA by PCR NEGATIVE NEGATIVE Final    Comment:        The GeneXpert MRSA Assay (FDA approved for NASAL specimens only), is one component of a comprehensive MRSA colonization surveillance program. It is not intended to diagnose MRSA infection nor to guide or monitor treatment for MRSA infections. Performed at [facility], [address], [place], [place] [address]   Urine Culture     Status: None   Collection Time: 02/21/19  1:44 AM  Result Value Ref Range Status   Specimen Description   Final    URINE, RANDOM Performed at [facility], [address] [place], [place] [address]    Special Requests   Final    NONE Performed at [facility], [address], [place], [place] [address]    Culture   Final    NO GROWTH Performed at [facility], [address] [place], [place] [address]    Report Status 02/22/2019 FINAL  Final  CULTURE, BLOOD (ROUTINE X 2) w Reflex to ID Panel     Status: None   Collection Time: 02/21/19  2:19 AM  Result Value Ref Range Status  Specimen Description BLOOD BLOOD LEFT FOREARM  Final   Special Requests   Final    BOTTLES DRAWN AEROBIC ONLY Blood Culture results may not be optimal due to an inadequate volume of blood received in culture bottles   Culture   Final    NO GROWTH 5 DAYS Performed at [facility], [address] [place], [place]  [address]    Report Status 02/26/2019 FINAL  Final  CULTURE, BLOOD (ROUTINE X 2) w Reflex to ID Panel     Status: None   Collection Time: 02/21/19  2:19 AM  Result Value Ref Range Status   Specimen Description BLOOD BLOOD LEFT WRIST  Final   Special Requests   Final    BOTTLES DRAWN AEROBIC ONLY Blood Culture results may not be optimal due to an inadequate volume of blood received in culture bottles   Culture   Final    NO GROWTH 5 DAYS Performed at [facility], [address], [place], [place] [address]    Report Status 02/26/2019 FINAL  Final    Radiology Reports Dg Abd 1 View  Result Date: 02/21/2019 CLINICAL DATA:  NG tube placement EXAM: ABDOMEN - 1 VIEW COMPARISON:  02/21/2019 FINDINGS: Esophageal tube tip is in the left upper quadrant. Airspace disease at the left base. Mild diffuse gaseous dilatation of the bowel IMPRESSION: Esophageal tube tip is in the left upper quadrant of the abdomen, projecting over the proximal stomach Electronically Signed   By: Jasmine Pang M.D.   On: 02/21/2019 22:38   Dg Abd 1 View  Result Date: 02/21/2019 CLINICAL DATA:  OG tube placement EXAM: ABDOMEN - 1 VIEW COMPARISON:  02/21/2019 FINDINGS: Esophageal tube is looped back upon itself over the distal esophagus. Diffuse mild air distention of the bowel. Infiltrate at the left base. IMPRESSION: Esophageal tube looped back upon itself over the distal esophagus. Electronically Signed   By: Jasmine Pang M.D.   On: 02/21/2019 22:37   Dg Abd 1 View  Result Date: 02/21/2019 CLINICAL DATA:  Enteric tube placement. EXAM: ABDOMEN - 1 VIEW COMPARISON:  Abdominal x-ray from yesterday. FINDINGS: Enteric tube looped in the stomach with the tip near the gastric fundus. Left femoral approach central venous catheter with tip in the left common iliac vein. Paucity of bowel gas. No evidence of obstruction. Possible 3 mm right renal calculus. No acute osseous abnormality. IMPRESSION: 1. Enteric tube looped in the  stomach. 2. Left femoral approach central venous catheter tip in the left common iliac vein. 3. No acute findings. Electronically Signed   By: Obie Dredge M.D.   On: 02/21/2019 14:25   Dg Chest Port 1 View  Result Date: 02/22/2019 CLINICAL DATA:  58 year old female with respiratory failure. Aspiration of bloody gastric contents. EXAM: PORTABLE CHEST 1 VIEW COMPARISON:  420 through 20 and earlier. FINDINGS: Stable endotracheal tube tip in good position. Enteric tube courses to the midline, tip not included. Mediastinal contours remain normal. Mildly lower lung volumes. Confluent bilateral perihilar and lower lung airspace opacity with retrocardiac air bronchograms. Increased left lower lobe consolidation since yesterday with some obscuration of the left hemidiaphragm. No superimposed pneumothorax. No pleural effusion is evident. Upper lung pulmonary vascularity appears normal. IMPRESSION: 1. Stable lines and tubes. 2. Confluent bilateral perihilar and lower lung pneumonia with increased left lower lobe consolidation since yesterday. Electronically Signed   By: Odessa Fleming M.D.   On: 02/22/2019 05:51   Dg Chest Port 1 View  Result Date: 02/21/2019 CLINICAL  DATA:  Hematemesis. EXAM: PORTABLE CHEST 1 VIEW COMPARISON:  Chest x-ray from same day at 7:16. FINDINGS: Interval placement of an endotracheal tube with the tip 2.0 cm above the carina. Enteric tube coiled within the stomach. The heart size and mediastinal contours are within normal limits. Normal pulmonary vascularity. New patchy perihilar and bilateral lower lobe consolidations. No pleural effusion or pneumothorax. No acute osseous abnormality. IMPRESSION: 1. Appropriately positioned endotracheal and enteric tubes. 2. New patchy perihilar and bilateral lower lobe consolidations, concerning for aspiration. Electronically Signed   By: Obie Dredge M.D.   On: 02/21/2019 14:22   Dg Chest Port 1 View  Result Date: 02/21/2019 CLINICAL DATA:   Leukocytosis. EXAM: PORTABLE CHEST 1 VIEW COMPARISON:  Radiographs of February 20, 2019. FINDINGS: The heart size and mediastinal contours are within normal limits. Both lungs are clear. The visualized skeletal structures are unremarkable. IMPRESSION: No active disease. Electronically Signed   By: Lupita Raider M.D.   On: 02/21/2019 08:17   Dg Abdomen Acute W/chest  Result Date: 02/20/2019 CLINICAL DATA:  Abdominal pain EXAM: DG ABDOMEN ACUTE W/ 1V CHEST COMPARISON:  None. FINDINGS: Moderate stool burden in the colon. The bowel gas pattern is normal. There is no evidence of free intraperitoneal air. No suspicious radio-opaque calculi or other significant radiographic abnormality is seen. Heart size and mediastinal contours are within normal limits. Both lungs are clear. IMPRESSION: Moderate stool burden.  No acute findings. Electronically Signed   By: Charlett Nose M.D.   On: 02/20/2019 20:05     CBC Recent Labs  Lab 02/21/19 0551  02/22/19 1003  02/25/19 0700 02/25/19 1651 02/26/19 0429 02/26/19 1715 02/27/19 0402  WBC 21.6*   < > 23.4*   < > 24.0* 24.1* 14.2* 14.7* 12.0*  HGB 8.5*   < > 10.0*   < > 8.1* 9.2* 8.8* 10.5* 9.1*  HCT 26.4*   < > 27.2*   < > 22.9* 26.9* 25.4* 31.2* 27.7*  PLT 298   < > 146*   < > 81* 106* 119* 191 177  MCV 92.3   < > 81.2   < > 86.4 87.3 86.7 88.9 91.7  MCH 29.7   < > 29.9   < > 30.6 29.9 30.0 29.9 30.1  MCHC 32.2   < > 36.8*   < > 35.4 34.2 34.6 33.7 32.9  RDW 13.1   < > 14.1   < > 14.6 14.5 14.1 14.2 14.4  LYMPHSABS 2.5  --  3.3  --   --   --   --   --  3.8  MONOABS 1.0  --  0.3  --   --   --   --   --  2.2*  EOSABS 0.0  --  0.3  --   --   --   --   --  0.3  BASOSABS 0.0  --  0.1  --   --   --   --   --  0.0   < > = values in this interval not displayed.    Chemistries  Recent Labs  Lab 02/20/19 1913  02/22/19 0424  02/24/19 0047 02/24/19 0453 02/24/19 2204 02/25/19 0536 02/26/19 0519 02/27/19 0402  NA 135   < > 158*   < > 146* 146* 144 145  146* 146*  K 6.5*   < > 3.7   < > 3.6 3.3* 3.3* 3.8 3.8 3.5  CL 98   < > 116*   < >  112* 112* 108 110 114* 107  CO2 9*   < > 30   < > 32  GLUCOSE 866*   < > 340*   < > 237* 214* 109* 150* 146* 89  BUN 34*   < > 56*   < > 41* 40* 33* 29* 20 18  CREATININE 2.76*   < > 2.30*   < > 2.63* 2.39* 2.23* 2.15* 1.91* 1.75*  CALCIUM 9.2   < > 6.2*   < > 6.4* 6.5* 7.3* 7.3* 7.7* 7.6*  MG  --    < > 1.4*   < > 1.8 1.8 1.8  --  1.7 2.1  AST 18  --  93*  --   --   --   --   --  103*  --   ALT 11  --  43  --   --   --   --   --  64*  --   ALKPHOS 113  --  39  --   --   --   --   --  65  --   BILITOT 1.1  --  1.0  --   --   --   --   --  0.9  --    < > = values in this interval not displayed.   ------------------------------------------------------------------------------------------------------------------ estimated creatinine clearance is 23.9 mL/min (A) (by C-G formula based on SCr of 1.75 mg/dL (H)). ------------------------------------------------------------------------------------------------------------------ No results for input(s): HGBA1C in the last 72 hours. ------------------------------------------------------------------------------------------------------------------ No results for input(s): CHOL, HDL, LDLCALC, TRIG, CHOLHDL, LDLDIRECT in the last 72 hours. ------------------------------------------------------------------------------------------------------------------ No results for input(s): TSH, T4TOTAL, T3FREE, THYROIDAB in the last 72 hours.  Invalid input(s): FREET3 ------------------------------------------------------------------------------------------------------------------ No results for input(s): VITAMINB12, FOLATE, FERRITIN, TIBC, IRON, RETICCTPCT in the last 72 hours.  Coagulation profile Recent Labs  Lab 02/21/19 0713  INR 1.5*    No results for input(s): DDIMER in the last 72 hours.  Cardiac Enzymes Recent Labs  Lab 02/21/19 2156 02/22/19 0424  02/22/19 1003  TROPONINI 0.26* 0.46* 0.42*   ------------------------------------------------------------------------------------------------------------------ Invalid input(s): POCBNP    Assessment & Plan  Patient is 58 year old admitted with nausea and vomiting  #1 DKA (diabetic ketoacidoses) (HCC) -resolved continue Lantus and sliding scale insulin Developed acute respiratory failure due to DKA, hemorrhagic shock, all that improved, patient is intubated, extubated.  Now on room air and saturation 95%.  Transfer out of ICU to regular floor.  #2.  hemorrhagic shock secondary to upper GI bleed .  Patient had endoscopy 2 times on April 22, April 25, CD from yesterday showed worsening gastric ulcer status post epinephrine injection, now hemoglobin is stable.  Continue PPIs.  Globin stable now.  #3 .hypotension related to likely upper GI bleed status post transfusion, hypotension improved. Continue stress dose steroids and antibiotics he wanted to see him again and keep him n.p.o.  #4 AKI (acute kidney injury) (HCC)  Slowly improving.   #5 leukocytosis possible reactive better slightly today  #6. anemia due to upper GI bleed; that is post blood transfusion, endoscopies, no hemoglobin stable.    7. hypokalemia, ; replaced, improved #8. hypovolemic shock, resolving,.     Deconditioning, physical therapy recommends SNF placement. COVID-19 has been ruled out.     Code Status Orders  (From admission, onward)         Start     Ordered   02/20/19 2207  Full code  Continuous     02/20/19  2206        Code Status History    This patient has a current code status but no historical code status.    Advance Directive Documentation     Most Recent Value  Type of Advance Directive  Healthcare Power of Attorney [Daughter- Tasha]  Pre-existing out of facility DNR order (yellow form or pink MOST form)  -  "MOST" Form in Place?  -           Consults pulm ccm  DVT Prophylaxis  scd's  Lab Results  Component Value Date   PLT 177 02/27/2019     Time Spent in minutes  35min spent   Katha HammingSnehalatha Frantz Quattrone M.D on 02/27/2019 at 10:18 AM  Between 7am to 6pm - Pager - [phone]  After 6pm go to [email or web address] - Social research officer, governmentpassword [email or web address] [email or web address]  [employer]   Office  [phone]

## 2019-02-27 NOTE — Progress Notes (Signed)
RN notified MD pt had 3 loose BM today already. Per MD okay for RN to rule pt for C-diff. But if c-diff test comes back negative okay to order imodium. Will continue to assess and monitor pt.

## 2019-02-27 NOTE — Progress Notes (Signed)
Per MD okay for RN to DC telemetry order. 

## 2019-02-27 NOTE — Progress Notes (Signed)
CRITICAL CARE NOTE        SUBJECTIVE FINDINGS & SIGNIFICANT EVENTS   Peripheral edema improved.  Optimizing for medical floor downgrade  PAST MEDICAL HISTORY   Past Medical History:  Diagnosis Date  . Diabetes mellitus without complication [facility](HCC)      SURGICAL HISTORY   Past Surgical History:  Procedure Laterality Date  . ABDOMINAL HYSTERECTOMY    . CHOLECYSTECTOMY    . ESOPHAGOGASTRODUODENOSCOPY N/A 02/21/2019   Procedure: ESOPHAGOGASTRODUODENOSCOPY (EGD);  Surgeon: Toledo, Boykin Nearingeodoro K, MD;  Location: [facility] ENDOSCOPY;  Service: Gastroenterology;  Laterality: N/A;  ICU  . ESOPHAGOGASTRODUODENOSCOPY (EGD) WITH PROPOFOL N/A 02/24/2019   Procedure: ESOPHAGOGASTRODUODENOSCOPY (EGD) WITH PROPOFOL;  Surgeon: Toledo, Boykin Nearingeodoro K, MD;  Location: [facility] ENDOSCOPY;  Service: Gastroenterology;  Laterality: N/A;     FAMILY HISTORY   History reviewed. No pertinent family history.   SOCIAL HISTORY   Social History   Tobacco Use  . Smoking status: Never Smoker  . Smokeless tobacco: Never Used  Substance Use Topics  . Alcohol use: Not Currently  . Drug use: Never     MEDICATIONS   Current Medication:  Current Facility-Administered Medications:  .  0.9 %  sodium chloride infusion, , Intravenous, PRN, Eugenie NorrieBlakeney, Dana G, NP, Stopped at 02/22/19 1223 .  fentaNYL (SUBLIMAZE) injection 12.5-25 mcg, 12.5-25 mcg, Intravenous, Q2H PRN, Tukov-Yual, Magdalene S, NP, 12.5 mcg at 02/27/19 0811 .  folic acid (FOLVITE) tablet 1 mg, 1 mg, Per Tube, Daily, Kasa, Kurian, MD, 1 mg at 02/27/19 1101 .  insulin aspart (novoLOG) injection 0-9 Units, 0-9 Units, Subcutaneous, Q4H, Eugenie NorrieBlakeney, Dana G, NP, 2 Units at 02/26/19 1959 .  insulin glargine (LANTUS) injection 6 Units, 6 Units, Subcutaneous, Daily, Eugenie NorrieBlakeney, Dana G, NP, 6 Units at  02/27/19 1106 .  MEDLINE mouth rinse, 15 mL, Mouth Rinse, BID, Kasa, Kurian, MD, 15 mL at 02/27/19 1102 .  multivitamin with minerals tablet 1 tablet, 1 tablet, Oral, Daily, Mauri ReadingMartin, Savanna M, [facility], 1 tablet at 02/26/19 1411 .  pantoprazole (PROTONIX) injection 40 mg, 40 mg, Intravenous, Q12H, Kasa, Kurian, MD, 40 mg at 02/27/19 1102 .  protein supplement (ENSURE MAX) liquid, 11 oz, Oral, BID BM, Sanyah Molnar, MD, 11 oz at 02/27/19 1102 .  QUEtiapine (SEROQUEL) tablet 25 mg, 25 mg, Per Tube, QHS, Erin FullingKasa, Kurian, MD, 25 mg at 02/26/19 2118 .  sodium chloride flush (NS) 0.9 % injection 10-40 mL, 10-40 mL, Intracatheter, Q12H, Harlon DittyKeene, Jeremiah D, NP, 30 mL at 02/27/19 1102 .  sodium chloride flush (NS) 0.9 % injection 10-40 mL, 10-40 mL, Intracatheter, PRN, Harlon DittyKeene, Jeremiah D, NP .  thiamine (VITAMIN B-1) tablet 100 mg, 100 mg, Per Tube, Daily, Kasa, Kurian, MD, 100 mg at 02/27/19 1101    ALLERGIES   Patient has no known allergies.    REVIEW OF SYSTEMS     10 point ROS conducted and is negative except as per subjective findings  PHYSICAL EXAMINATION   Vitals:   02/27/19 1000 02/27/19 1100  BP: 118/75 108/66  Pulse: (!) 114 88  Resp: (!) 31 (!) 25  Temp:    SpO2: 97% 99%    GENERAL:NAD HEAD: Normocephalic, atraumatic.  EYES: Pupils equal, round, reactive to light.  No scleral icterus.  MOUTH: Moist mucosal membrane. NECK: Supple. No thyromegaly. No nodules. No JVD.  PULMONARY: bibasilar crackles improved CARDIOVASCULAR: S1 and S2. Regular rate and rhythm. No murmurs, rubs, or gallops.  GASTROINTESTINAL: Soft, nontender, non-distended. No masses. Positive bowel sounds. No hepatosplenomegaly.  MUSCULOSKELETAL: No swelling, clubbing,  or edema.  NEUROLOGIC: Mild distress due to acute illness SKIN:intact,warm,dry   LABS AND IMAGING      LAB RESULTS: Recent Labs  Lab 02/25/19 0536 02/26/19 0519 02/27/19 0402  NA 145 146* 146*  K 3.8 3.8 3.5  CL 110 114* 107  CO2 31 29  32  BUN 29* 20 18  CREATININE 2.15* 1.91* 1.75*  GLUCOSE 150* 146* 89   Recent Labs  Lab 02/26/19 0429 02/26/19 1715 02/27/19 0402  HGB 8.8* 10.5* 9.1*  HCT 25.4* 31.2* 27.7*  WBC 14.2* 14.7* 12.0*  PLT 119* 191 177     IMAGING RESULTS: No results found.    ASSESSMENT AND PLAN     -Multidisciplinary rounds held today  Acute Hypoxic Respiratory Failure - resolved      - due to aspiration of GI contents       -Now stabalized on room air       - background of pulmonary edema will diurese gently today       -sucessfully liberated off of mechanical ventilation      -central line - Left femoral line will remove post PIV insertion   Acute blood loss anemia      Due to UGIB   -s/p GI evaluation -  Plan to advance diet today,  Plan for Hpylori testing per GI,  Acid suppresion with PPI bid -IVF 100cc/hr -stopping D5w now - eating PO diet now      CARDIAC- ICU monitoring  Renal Failure-most likely due to ATN        -KDIGO stage 3 -follow chem 7 -follow UO -continue Foley Catheter-assess need daily    ID -continue IV abx as prescibed -follow up cultures  GI/Nutrition GI PROPHYLAXIS as indicated DIET-->TF's as tolerated Constipation protocol as indicated  ENDO - ICU hypoglycemic\Hyperglycemia protocol -check FSBS per protocol   ELECTROLYTES -follow labs as needed -replace as needed -pharmacy consultation   DVT/GI PRX ordered -SCDs  TRANSFUSIONS AS NEEDED MONITOR FSBS ASSESS the need for LABS as needed   Critical care provider statement:   Critical care time (minutes): 33  Critical care time was exclusive of: Separately billable procedures and treating other patients  Critical care was necessary to treat or prevent imminent or life-threatening deterioration of the following conditions: Acute hypoxemic respiratory failure, Acute blood loss anemia, acute kidney injury, aspiration pneumonia, multiple comorbid conditions.    Critical care was time spent personally by me on the following activities: Development of treatment plan with patient or surrogate, discussions with consultants, evaluation of patient's response to treatment, examination of patient, obtaining history from patient or surrogate, ordering and performing treatments and interventions, ordering and review of laboratory studies and re-evaluation of patient's condition.  I assumed direction of critical care for this patient from another provider in my specialty: no    This document was prepared using Dragon voice recognition software and may include unintentional dictation errors.    Vida Rigger, M.D.  Division of Pulmonary & Critical Care Medicine  [facility]

## 2019-02-28 LAB — BLOOD GAS, ARTERIAL
Acid-Base Excess: 3.3 mmol/L — ABNORMAL HIGH (ref 0.0–2.0)
Bicarbonate: 29.1 mmol/L — ABNORMAL HIGH (ref 20.0–28.0)
FIO2: 0.7
MECHVT: 400 mL
O2 Saturation: 99.5 %
PEEP: 15 cmH2O
Patient temperature: 37
RATE: 18 resp/min
pCO2 arterial: 48 mmHg (ref 32.0–48.0)
pH, Arterial: 7.39 (ref 7.350–7.450)
pO2, Arterial: 173 mmHg — ABNORMAL HIGH (ref 83.0–108.0)

## 2019-02-28 LAB — BASIC METABOLIC PANEL
Anion gap: 5 (ref 5–15)
BUN: 17 mg/dL (ref 6–20)
CO2: 31 mmol/L (ref 22–32)
Calcium: 7.5 mg/dL — ABNORMAL LOW (ref 8.9–10.3)
Chloride: 110 mmol/L (ref 98–111)
Creatinine, Ser: 1.72 mg/dL — ABNORMAL HIGH (ref 0.44–1.00)
GFR calc Af Amer: 38 mL/min — ABNORMAL LOW (ref >60)
GFR calc non Af Amer: 32 mL/min — ABNORMAL LOW (ref >60)
Glucose, Bld: 130 mg/dL — ABNORMAL HIGH (ref 70–99)
Potassium: 3.9 mmol/L (ref 3.5–5.1)
Sodium: 146 mmol/L — ABNORMAL HIGH (ref 135–145)

## 2019-02-28 LAB — PHOSPHORUS: Phosphorus: 3.3 mg/dL (ref 2.5–4.6)

## 2019-02-28 LAB — GLUCOSE, CAPILLARY
Glucose-Capillary: 119 mg/dL — ABNORMAL HIGH (ref 70–99)
Glucose-Capillary: 133 mg/dL — ABNORMAL HIGH (ref 70–99)
Glucose-Capillary: 153 mg/dL — ABNORMAL HIGH (ref 70–99)
Glucose-Capillary: 301 mg/dL — ABNORMAL HIGH (ref 70–99)

## 2019-02-28 LAB — MAGNESIUM: Magnesium: 2.2 mg/dL (ref 1.7–2.4)

## 2019-02-28 MED ORDER — FERROUS SULFATE 325 (65 FE) MG PO TBEC: 325.0000 mg | DELAYED_RELEASE_TABLET | Freq: Two times a day (BID) | ORAL | 0 refills | Status: DC

## 2019-02-28 MED ORDER — INSULIN DETEMIR 100 UNIT/ML ~~LOC~~ SOLN: 10.0000 [IU] | Freq: Every day | SUBCUTANEOUS | 0 refills | Status: DC

## 2019-02-28 MED ORDER — ACETAMINOPHEN 325 MG PO TABS
650.0000 mg | ORAL_TABLET | Freq: Four times a day (QID) | ORAL | Status: DC | PRN
  Administered 2019-02-28: 650 mg via ORAL
  Filled 2019-02-28: qty 2

## 2019-02-28 MED ORDER — INSULIN ASPART 100 UNIT/ML ~~LOC~~ SOLN: 0.0000 [IU] | Freq: Every day | SUBCUTANEOUS | Status: DC

## 2019-02-28 MED ORDER — INSULIN ASPART 100 UNIT/ML ~~LOC~~ SOLN: 5.0000 [IU] | Freq: Three times a day (TID) | SUBCUTANEOUS | 0 refills | Status: DC

## 2019-02-28 MED ORDER — INSULIN ASPART 100 UNIT/ML ~~LOC~~ SOLN
0.0000 [IU] | Freq: Three times a day (TID) | SUBCUTANEOUS | Status: DC
  Administered 2019-02-28: 7 [IU] via SUBCUTANEOUS
  Administered 2019-02-28: 1 [IU] via SUBCUTANEOUS
  Filled 2019-02-28 (×2): qty 1

## 2019-02-28 MED ORDER — FUROSEMIDE 20 MG PO TABS
20.0000 mg | ORAL_TABLET | Freq: Once | ORAL | Status: AC
  Administered 2019-02-28: 20 mg via ORAL
  Filled 2019-02-28: qty 1

## 2019-02-28 MED ORDER — TRAMADOL HCL 50 MG PO TABS: 50.0000 mg | ORAL_TABLET | Freq: Two times a day (BID) | ORAL | 0 refills | Status: DC | PRN

## 2019-02-28 MED ORDER — TRAMADOL HCL 50 MG PO TABS: 50.0000 mg | ORAL_TABLET | Freq: Two times a day (BID) | ORAL | Status: DC | PRN

## 2019-02-28 MED ORDER — INSULIN ASPART 100 UNIT/ML ~~LOC~~ SOLN
3.0000 [IU] | Freq: Three times a day (TID) | SUBCUTANEOUS | Status: DC
  Administered 2019-02-28 (×2): 3 [IU] via SUBCUTANEOUS
  Filled 2019-02-28 (×2): qty 1

## 2019-02-28 MED ORDER — PANTOPRAZOLE SODIUM 40 MG PO TBEC: 40.0000 mg | DELAYED_RELEASE_TABLET | Freq: Two times a day (BID) | ORAL | 0 refills | Status: DC

## 2019-02-28 NOTE — TOC Initial Note (Addendum)
Transition of Care [facility](TOC) - Initial/Assessment Note    Patient Details  Name: Bethany James MRN: [id number] Date of Birth: Apr 19, 1961  Transition of Care [facility](TOC) CM/SW Contact:    Ruthe Mannanandace  Kadeisha Betsch, LCSWA Phone Number: 02/28/2019, 10:51 AM  Clinical Narrative: Patient is being recommended for SNF. CSW spoke with patient to determine discharge plan. At time of interview, patient was on the phone with her daughter Rhea BleacherLatasha Hiatate [phone]. Patient asked that CSW speak with daughter. CSW spoke with BahrainLatasha who states that patient has been living with her and her family for 2 years. CSW explained PT recommendation of short term rehab in skilled nursing facility. Daughter states under no circumstances will patient go to a facility and that she will be going home with her and her family. CSW explained that patient could have home health since SNF was being refused. Daughter is in agreement with home health and has selected [facility]. CSW notified Barbara CowerJason with [facility] of referral. Patient will also need a walker at discharge. CSW notified Brad with [employer] of need for walker. CSW will continue to follow for discharge planning.    [facility].                   Expected Discharge Plan: Skilled Nursing Facility Barriers to Discharge: Continued Medical Work up   Patient Goals and CMS Choice Patient states their goals for this hospitalization and ongoing recovery are:: I want to go home.  CMS Medicare.gov Compare Post Acute Care list provided to:: Patient Choice offered to / list presented to : Adult Children, Patient  Expected Discharge Plan and Services Expected Discharge Plan: Skilled Nursing Facility     Post Acute Care Choice: Home Health Living arrangements for the past 2 months: Single Family Home                 DME Arranged: Walker rolling DME Agency: [facility] Date DME Agency Contacted: 02/28/19 Time DME Agency Contacted:  1029 Representative spoke with at DME Agency: Mitchell HeirBrad Leonard HH Arranged: PT, OT, RN [facility] Agency: [facility] Date HH Agency Contacted: 02/28/19 Time HH Agency Contacted: 1049 [facility] Agency: Feliberto GottronJason Hinton  Prior Living Arrangements/Services Living arrangements for the past 2 months: Single Family Home Lives with:: Adult Children Patient language and need for interpreter reviewed:: Yes Do you feel safe going back to the place where you live?: Yes      Need for Family Participation in Patient Care: Yes (Comment) Care giver support system in place?: Yes (comment)   Criminal Activity/Legal Involvement Pertinent to Current Situation/Hospitalization: No - Comment as needed  Activities of Daily Living Home Assistive Devices/Equipment: None ADL Screening (condition at time of admission) Patient's cognitive ability adequate to safely complete daily activities?: Yes Is the patient deaf or have difficulty hearing?: No Does the patient have difficulty seeing, even when wearing glasses/contacts?: No Does the patient have difficulty concentrating, remembering, or making decisions?: No Patient able to express need for assistance with ADLs?: Yes Does the patient have difficulty dressing or bathing?: Yes Independently performs ADLs?: No Communication: Independent Dressing (OT): Needs assistance Is this a change from baseline?: Pre-admission baseline Grooming: Independent Feeding: Independent Bathing: Needs assistance Is this a change from baseline?: Pre-admission baseline Toileting: Needs assistance Is this a change from baseline?: Pre-admission baseline In/Out Bed: Needs assistance Is this a change from baseline?: Pre-admission baseline Walks in Home: Independent Does the patient have  difficulty walking or climbing stairs?: No Weakness of Legs: Both Weakness of Arms/Hands: None  Permission Sought/Granted Permission sought to share information with  : Case Manager, Magazine features editor, Family Supports Permission granted to share information with : Yes, Verbal Permission Granted  Share Information with NAME: Family   Permission granted to share info w AGENCY: Home Health         Emotional Assessment Appearance:: Appears stated age   Affect (typically observed): Accepting, Pleasant Orientation: : Oriented to Self, Oriented to Place, Oriented to  Time Alcohol / Substance Use: Not Applicable Psych Involvement: No (comment)  Admission diagnosis:  Hyperglycemia Patient Active Problem List   Diagnosis Date Noted  . Acute respiratory failure with hypoxia (HCC)   . DKA (diabetic ketoacidoses) (HCC) 02/20/2019  . AKI (acute kidney injury) (HCC) 02/20/2019   PCP:  System, Pcp Not In Pharmacy:   [facility] - [place], [place] - [address] [place] [address] Phone: [phone] Fax: [phone]     Social Determinants of Health (SDOH) Interventions    Readmission Risk Interventions No flowsheet data found.

## 2019-02-28 NOTE — Progress Notes (Signed)
Bethany James  A and O x 4. VSS. Pt tolerating diet well. No complaints of pain or nausea. IV removed intact, prescriptions given. Pt voiced understanding of discharge instructions with no further questions. Pt discharged via wheelchair with RN.  Allergies as of 02/28/2019   No Known Allergies     Medication List    TAKE these medications   ferrous sulfate 325 (65 FE) MG EC tablet Take 1 tablet (325 mg total) by mouth 2 (two) times daily with a meal for 30 days.   insulin aspart 100 UNIT/ML injection Commonly known as:  novoLOG Inject 5 Units into the skin 3 (three) times daily before meals.   insulin detemir 100 UNIT/ML injection Commonly known as:  LEVEMIR Inject 0.1 mLs (10 Units total) into the skin at bedtime. What changed:  how much to take   pantoprazole 40 MG tablet Commonly known as:  Protonix Take 1 tablet (40 mg total) by mouth 2 (two) times daily before a meal.   traMADol 50 MG tablet Commonly known as:  ULTRAM Take 1 tablet (50 mg total) by mouth 2 (two) times daily as needed for severe pain.            Durable Medical Equipment  (From admission, onward)         Start     Ordered   02/28/19 1031  For home use only DME Walker rolling  Once    Question:  Patient needs a walker to treat with the following condition  Answer:  Muscle weakness (generalized)   02/28/19 1030          Vitals:   02/28/19 0300 02/28/19 1202  BP: 102/75 116/81  Pulse: 95 (!) 107  Resp: 16 16  Temp: 98.3 F (36.8 C) 98.7 F (37.1 C)  SpO2: 98% 100%    Suzzanne Cloud

## 2019-02-28 NOTE — Progress Notes (Addendum)
Inpatient Diabetes Program Recommendations  AACE/ADA: New Consensus Statement on Inpatient Glycemic Control  Target Ranges:  Prepandial:   less than 140 mg/dL      Peak postprandial:   less than 180 mg/dL (1-2 hours)      Critically ill patients:  140 - 180 mg/dL   Results for Bethany James, Bethany James (MRN [id number]) as of 02/28/2019 07:36  Ref. Range 02/27/2019 07:25 02/27/2019 11:30 02/27/2019 16:50 02/27/2019 20:17 02/28/2019 00:19 02/28/2019 03:36  Glucose-Capillary Latest Ref Range: 70 - 99 mg/dL 836 (H) 629 (H) 476 (H) 209 (H) 153 (H) 119 (H)   Review of Glycemic Control  Diabetes history: DM2 Outpatient Diabetes medications: Levemir 15 units QHS, Novolog 5 units TID with meals Current orders for Inpatient glycemic control: Lantus 6 units daily, Novolog 0-9 units Q4H  Inpatient Diabetes Program Recommendations:   Insulin-Correction: Please consider changing CBGs to ACHS and Novolog correction to 0-9 units TID with meals and Novolog 0-5 units QHS.   Insulin - Meal Coverage: Post prandial glucose is consistently elevated.  Please consider ordering Novolog 4 units TID with meals for meal coverage if patient eats at least 50% of meals.  Thanks, Orlando Penner, RN, MSN, CDE Diabetes Coordinator Inpatient Diabetes Program [phone] (Team Pager from 8am to 5pm)

## 2019-02-28 NOTE — Progress Notes (Signed)
Received verbal orders to discontinue enteric precautions per MD. Orders followed.

## 2019-02-28 NOTE — Progress Notes (Signed)
PHARMACY CONSULT NOTE - FOLLOW UP  Pharmacy Consult for Electrolyte Monitoring and Replacement   Recent Labs: Potassium (mmol/L)  Date Value  02/28/2019 3.9   Magnesium (mg/dL)  Date Value  55/37/4827 2.2   Calcium (mg/dL)  Date Value  07/86/7544 7.5 (L)   Albumin (g/dL)  Date Value  92/11/69 1.9 (L)   Phosphorus (mg/dL)  Date Value  21/97/5883 3.3   Sodium (mmol/L)  Date Value  02/28/2019 146 (H)     Assessment: Patient was admitted for hematemesis and EGD performed found oozing gastric ulcer.  Goal of Therapy:  Phosphorus 2.5 - 4.5, Potassium ~4.0, Magnesium ~2.0  Plan:  K 3.9  Mag 2.2  Phos 3.3 No supplementation at this time.  Will order electrolytes with AM labs.  Pharmacy will continue to monitor.   Bari Mantis PharmD Clinical Pharmacist 02/28/2019

## 2019-02-28 NOTE — Progress Notes (Signed)
Physical Therapy Treatment Patient Details Name: Bethany James MRN: [id number] DOB: [birth year] Today's Date: 02/28/2019    History of Present Illness Bethany James is a 58yo female who comes to [facility] 4/21 c hematemesis, BG in 800s upon arrival, pt admitted with DKA. Pt was intubated on 4/22, upper GI endoscopy on 4/22, 4/23, 4/25. Pt extubated on 4/26, now on RA. Pt noted to have gastric ulcer and duodenal ulcer, now followed by GI.      PT Comments    Pt up in chair upon entry, appears more alert and oriented than previous day, less flat affect, less impulsivity during session. Pt exhibiting improved trunk strength and control. Pt tolerates transfers training 7x with youth RW, special attention brought toward improving balance establishment when oming to standing.Pt continues to have mild posterior lean, especially notable when turnind during AMB and transition (with some retroAMB) required to return to sitting. Pt AMB two bouts, but has BLE foot pain, which she posits may be related to limb swelling. LUE remains edematous similar to 1DA, but improved from 2DA. Pt reports BLE remain edematous, but RUE is at baseline denying edema. Pt assisted to [facility] for voiding. At end of session, pt up in chair with lunch tray presented. PT visualized Rt vastus lateralis fasciculation ~4Hz , pt reports this a novel event, no pain associated, and twitching not forceful enough to elicit limb/joint movement. PT discussed updated recommendations for return to home with HHPT, pt agreeable, however patient continues to be slightly altered and for optimal safety will need supervision. Pt also still struggling with balance in standing and AMB, will need hands-on supervision with mobility. 3 significant LOB in this session, physical assistance required to restore balance.     Follow Up Recommendations  Home health PT;Supervision/Assistance - 24 hour;Supervision for mobility/OOB     Equipment Recommendations  (youth rolling  walker (pt is 58'9") )    Recommendations for Other Services       Precautions / Restrictions Precautions Precautions: Fall Precaution Comments: impulsive; ataxic; Watch LUE, very edematous and painful;  Restrictions Weight Bearing Restrictions: No    Mobility  Bed Mobility               General bed mobility comments: received up in chair  Transfers Overall transfer level: Needs assistance Equipment used: Rolling walker (2 wheeled)(youth rw) Transfers: Sit to/from Stand           General transfer comment: Performed 7x in session, still requires CGA for stability, but independence is improving with balance upon standing.  Ambulation/Gait Ambulation/Gait assistance: Min guard;Min assist Gait Distance (Feet): 70 Feet(65ft, then 48ft;) Assistive device: (youthRW ) Gait Pattern/deviations: Decreased step length - right Gait velocity: LOB with turns while AMB    General Gait Details: excessive RLE toe-out, decreased clearance with swing phase.    Stairs             Wheelchair Mobility    Modified Rankin (Stroke Patients Only)       Balance Overall balance assessment: Needs assistance   Sitting balance-Leahy Scale: Good Sitting balance - Comments: improved trunk flexion strength and seated trunk control sitting unsupported   Standing balance support: Bilateral upper extremity supported;During functional activity Standing balance-Leahy Scale: Fair Standing balance comment: RW required. loses balance when turning.             High level balance activites: Turns;Direction changes;Backward walking              Cognition Arousal/Alertness: Awake/alert Behavior During  Therapy: WFL for tasks assessed/performed Overall Cognitive Status: Within Functional Limits for tasks assessed                                 General Comments: Not quite 'flat affect' but does emote more this date. Impulsivity seems improved.       Exercises       General Comments        Pertinent Vitals/Pain Pain Assessment: Faces Faces Pain Scale: Hurts little more Pain Location: BLEs, Left hand  Pain Descriptors / Indicators: Aching;Sore Pain Intervention(s): Limited activity within patient's tolerance;Monitored during session;Repositioned    Home Living                      Prior Function            PT Goals (current goals can now be found in the care plan section) Acute Rehab PT Goals Patient Stated Goal: regain strength and mobility to PLOF  PT Goal Formulation: With patient Time For Goal Achievement: 03/12/19 Potential to Achieve Goals: Good Progress towards PT goals: Progressing toward goals    Frequency    Min 2X/week      PT Plan Discharge plan needs to be updated    Co-evaluation              AM-PAC PT "6 Clicks" Mobility   Outcome Measure  Help needed turning from your back to your side while in a flat bed without using bedrails?: None Help needed moving from lying on your back to sitting on the side of a flat bed without using bedrails?: A Little Help needed moving to and from a bed to a chair (including a wheelchair)?: A Little Help needed standing up from a chair using your arms (e.g., wheelchair or bedside chair)?: A Little Help needed to walk in hospital room?: A Little Help needed climbing 3-5 steps with a railing? : A Little 6 Click Score: 19    End of Session Equipment Utilized During Treatment: Gait belt Activity Tolerance: Patient tolerated treatment well Patient left: in chair;with chair alarm set;with SCD's reapplied(meal presented) Nurse Communication: Mobility status PT Visit Diagnosis: Unsteadiness on feet (R26.81);Other abnormalities of gait and mobility (R26.89);Muscle weakness (generalized) (M62.81);Difficulty in walking, not elsewhere classified (R26.2)     Time: [id number] PT Time Calculation (min) (ACUTE ONLY): 29 min  Charges:  $Therapeutic Exercise: 23-37  mins                     12:45 PM, 02/28/19 Rosamaria LintsAllan C Buccola, PT, DPT Physical Therapist - [facility]  [phone] (ASCOM)    Buccola,Allan C 02/28/2019, 12:39 PM

## 2019-03-03 ENCOUNTER — Encounter: Payer: Self-pay | Admitting: Certified Registered Nurse Anesthetist

## 2019-03-03 NOTE — Discharge Summary (Signed)
[facility] - [place] at [facility]   PATIENT NAME: Bethany James    MR#:  [id number]  DATE OF BIRTH:  August 31, 1961  DATE OF ADMISSION:  02/20/2019 ADMITTING PHYSICIAN: Oralia Manisavid Willis, MD  DATE OF DISCHARGE: 02/28/2019  5:05 PM  PRIMARY CARE PHYSICIAN: System, Pcp Not In   ADMISSION DIAGNOSIS:  Hyperglycemia  DISCHARGE DIAGNOSIS:  Principal Problem:   DKA (diabetic ketoacidoses) (HCC) Active Problems:   AKI (acute kidney injury) (HCC)   Acute respiratory failure with hypoxia (HCC)   SECONDARY DIAGNOSIS:   Past Medical History:  Diagnosis Date  . Diabetes mellitus without complication (HCC)      ADMITTING HISTORY  HISTORY OF PRESENT ILLNESS:  Bethany OdeaJamica Spengler  is a 58 y.o. female who presents with chief complaint as above.  Patient presents to the ED with a complaint of nausea and vomiting all day.  She states that she ate her oatmeal and peaches this morning, and then began throwing up shortly thereafter and has not been able to stop all day.  She states that later episodes of emesis were blood tinged.  She has a history of diabetes, but reports never having been in DKA before.  Work-up here in the ED shows significant DKA today.  She states that her glucose was in the 150s this morning when she ate her breakfast, and she thinks she took her insulin but she cannot recall for sure.  Hospitalist were called for admission   HOSPITAL COURSE:   Patient is 58 year old admitted with nausea and vomiting  * DKA (diabetic ketoacidoses) (HCC) -resolved continue Lantus and sliding scale insulin Developed acute respiratory failure due to DKA, hemorrhagic shock, all that improved, patient is intubated, extubated.  Now on room air and saturation 95%.  On long-acting insulin along with sliding scale insulin and stable  *Hypovolemic  hemorrhagic shock secondary to upper GI bleed .  Patient had endoscopy 2 times on April 22, April 25, CD from yesterday showed worsening gastric  ulcer status post epinephrine injection, now hemoglobin is stable.  Continue PPIs.    Hemoglobin stable now.  * AKI (acute kidney injury) (HCC)  Slowly improving.    * leukocytosis possible reactive better slightly today  *Acute blood loss anemia secondary to GI bleed is stable at this time.  Status post blood transfusion.   * hypokalemia, Replaced and resolved  Worked with physical therapy and skilled nursing facility recommended initially.  But patient slowly improved and discharged home with home health services after patient and daughter refused skilled nursing facility.  CONSULTS OBTAINED:    DRUG ALLERGIES:  No Known Allergies  DISCHARGE MEDICATIONS:   Allergies as of 02/28/2019   No Known Allergies     Medication List    TAKE these medications   ferrous sulfate 325 (65 FE) MG EC tablet Take 1 tablet (325 mg total) by mouth 2 (two) times daily with a meal for 30 days.   insulin aspart 100 UNIT/ML injection Commonly known as:  novoLOG Inject 5 Units into the skin 3 (three) times daily before meals.   insulin detemir 100 UNIT/ML injection Commonly known as:  LEVEMIR Inject 0.1 mLs (10 Units total) into the skin at bedtime. What changed:  how much to take   pantoprazole 40 MG tablet Commonly known as:  Protonix Take 1 tablet (40 mg total) by mouth 2 (two) times daily before a meal.   traMADol 50 MG tablet Commonly known as:  ULTRAM Take 1 tablet (50 mg total) by  mouth 2 (two) times daily as needed for severe pain.       Today   VITAL SIGNS:  Blood pressure 116/81, pulse (!) 107, temperature 98.7 F (37.1 C), resp. rate 16, height  (1.448 m), weight 48.6 kg, SpO2 100 %.  I/O:  No intake or output data in the 24 hours ending 03/03/19 1032  PHYSICAL EXAMINATION:  Physical Exam  GENERAL:  58 y.o.-year-old patient lying in the bed with no acute distress.  LUNGS: Normal breath sounds bilaterally, no wheezing, rales,rhonchi or crepitation. No use  of accessory muscles of respiration.  CARDIOVASCULAR: S1, S2 normal. No murmurs, rubs, or gallops.  ABDOMEN: Soft, non-tender, non-distended. Bowel sounds present. No organomegaly or mass.  NEUROLOGIC: Moves all 4 extremities. PSYCHIATRIC: The patient is alert and oriented x 3.  SKIN: No obvious rash, lesion, or ulcer.   DATA REVIEW:   CBC Recent Labs  Lab 02/27/19 0402  WBC 12.0*  HGB 9.1*  HCT 27.7*  PLT 177    Chemistries  Recent Labs  Lab 02/26/19 0519  02/28/19 0259  NA 146*   < > 146*  K 3.8   < > 3.9  CL 114*   < > 110  CO2 29   < > 31  GLUCOSE 146*   < > 130*  BUN 20   < > 17  CREATININE 1.91*   < > 1.72*  CALCIUM 7.7*   < > 7.5*  MG 1.7   < > 2.2  AST 103*  --   --   ALT 64*  --   --   ALKPHOS 65  --   --   BILITOT 0.9  --   --    < > = values in this interval not displayed.    Cardiac Enzymes No results for input(s): TROPONINI in the last 168 hours.  Microbiology Results  Results for orders placed or performed during the hospital encounter of 02/20/19  MRSA PCR Screening     Status: None   Collection Time: 02/20/19 10:07 PM  Result Value Ref Range Status   MRSA by PCR NEGATIVE NEGATIVE Final    Comment:        The GeneXpert MRSA Assay (FDA approved for NASAL specimens only), is one component of a comprehensive MRSA colonization surveillance program. It is not intended to diagnose MRSA infection nor to guide or monitor treatment for MRSA infections. Performed at [facility], [address] [place], [place] [address]   Urine Culture     Status: None   Collection Time: 02/21/19  1:44 AM  Result Value Ref Range Status   Specimen Description   Final    URINE, RANDOM Performed at [facility], [address], [place], [place] [address]    Special Requests   Final    NONE Performed at [facility], [address] [place], [place] [address]    Culture   Final    NO GROWTH Performed at [facility],  [address] [place], [place] [address]    Report Status 02/22/2019 FINAL  Final  CULTURE, BLOOD (ROUTINE X 2) w Reflex to ID Panel     Status: None   Collection Time: 02/21/19  2:19 AM  Result Value Ref Range Status   Specimen Description BLOOD BLOOD LEFT FOREARM  Final   Special Requests   Final    BOTTLES DRAWN AEROBIC ONLY Blood Culture results may not be optimal due to an inadequate volume  of blood received in culture bottles   Culture   Final    NO GROWTH 5 DAYS Performed at [facility], [address] [place], [place] [address]    Report Status 02/26/2019 FINAL  Final  CULTURE, BLOOD (ROUTINE X 2) w Reflex to ID Panel     Status: None   Collection Time: 02/21/19  2:19 AM  Result Value Ref Range Status   Specimen Description BLOOD BLOOD LEFT WRIST  Final   Special Requests   Final    BOTTLES DRAWN AEROBIC ONLY Blood Culture results may not be optimal due to an inadequate volume of blood received in culture bottles   Culture   Final    NO GROWTH 5 DAYS Performed at [facility], [address], [place], [place] [address]    Report Status 02/26/2019 FINAL  Final    RADIOLOGY:  No results found.  Follow up with PCP in 1 week.  Management plans discussed with the patient, family and they are in agreement.  CODE STATUS:  Code Status History    Date Active Date Inactive Code Status Order ID Comments User Context   02/20/2019 2206 02/28/2019 2010 Full Code [id number]  Oralia Manis, MD Inpatient    Advance Directive Documentation     Most Recent Value  Type of Advance Directive  Healthcare Power of Attorney [Daughter- Tasha]  Pre-existing out of facility DNR order (yellow form or pink MOST form)  -  "MOST" Form in Place?  -      TOTAL TIME TAKING CARE OF THIS PATIENT ON DAY OF DISCHARGE: more than 30 minutes.   Molinda Bailiff Ryman Rathgeber M.D on 03/03/2019 at 10:32 AM  Between 7am to 6pm - Pager - [phone]  After 6pm go to [email or web address] - password [email or web address]  [facility]  [facility] [place] [facility]  Office  [phone]  CC: Primary care physician; System, Pcp Not In  Note: This dictation was prepared with Dragon dictation along with smaller phrase technology. Any transcriptional errors that result from this process are unintentional.

## 2019-04-09 ENCOUNTER — Ambulatory Visit: Payer: Medicaid Other | Admitting: Nurse Practitioner

## 2019-04-09 ENCOUNTER — Other Ambulatory Visit: Payer: Self-pay

## 2019-04-09 ENCOUNTER — Encounter: Payer: Self-pay | Admitting: Nurse Practitioner

## 2019-04-09 VITALS — BP 131/88 | HR 89 | Temp 98.8°F | Ht <= 58 in | Wt 105.0 lb

## 2019-04-09 DIAGNOSIS — Z1329 Encounter for screening for other suspected endocrine disorder: Secondary | ICD-10-CM

## 2019-04-09 DIAGNOSIS — F321 Major depressive disorder, single episode, moderate: Secondary | ICD-10-CM

## 2019-04-09 DIAGNOSIS — R1084 Generalized abdominal pain: Secondary | ICD-10-CM

## 2019-04-09 DIAGNOSIS — F411 Generalized anxiety disorder: Secondary | ICD-10-CM

## 2019-04-09 DIAGNOSIS — E1169 Type 2 diabetes mellitus with other specified complication: Secondary | ICD-10-CM | POA: Insufficient documentation

## 2019-04-09 DIAGNOSIS — E1165 Type 2 diabetes mellitus with hyperglycemia: Secondary | ICD-10-CM | POA: Diagnosis not present

## 2019-04-09 DIAGNOSIS — E785 Hyperlipidemia, unspecified: Secondary | ICD-10-CM

## 2019-04-09 DIAGNOSIS — E1121 Type 2 diabetes mellitus with diabetic nephropathy: Secondary | ICD-10-CM

## 2019-04-09 DIAGNOSIS — Z794 Long term (current) use of insulin: Secondary | ICD-10-CM

## 2019-04-09 DIAGNOSIS — I1310 Hypertensive heart and chronic kidney disease without heart failure, with stage 1 through stage 4 chronic kidney disease, or unspecified chronic kidney disease: Secondary | ICD-10-CM | POA: Insufficient documentation

## 2019-04-09 DIAGNOSIS — N183 Chronic kidney disease, stage 3 unspecified: Secondary | ICD-10-CM

## 2019-04-09 DIAGNOSIS — E1122 Type 2 diabetes mellitus with diabetic chronic kidney disease: Secondary | ICD-10-CM

## 2019-04-09 DIAGNOSIS — R413 Other amnesia: Secondary | ICD-10-CM

## 2019-04-09 MED ORDER — DULOXETINE HCL 30 MG PO CPEP: ORAL_CAPSULE | ORAL | 3 refills | Status: DC

## 2019-04-09 MED ORDER — INSULIN ASPART 100 UNIT/ML FLEXPEN: 5.0000 [IU] | PEN_INJECTOR | Freq: Three times a day (TID) | SUBCUTANEOUS | 11 refills | Status: DC

## 2019-04-09 MED ORDER — INSULIN DETEMIR 100 UNIT/ML FLEXPEN: 10.0000 [IU] | PEN_INJECTOR | Freq: Every day | SUBCUTANEOUS | 11 refills | Status: DC

## 2019-04-09 MED ORDER — SERTRALINE HCL 25 MG PO TABS: 25.0000 mg | ORAL_TABLET | Freq: Every day | ORAL | 5 refills | Status: DC

## 2019-04-09 NOTE — Assessment & Plan Note (Signed)
No current BP medications and BP at goal.  Consider addition of low dose ACE/ARB for kidney protection, will further discuss with patient and daughter next week.  CMP today.

## 2019-04-09 NOTE — Assessment & Plan Note (Signed)
Unable to provide urine and WNL assessment.  Recommend increased hydration.  Suspect left sided pain is from left hip, where patient reports pain.  Recommend if increased symptoms or fever to notify provider immediately.  CMP and CBC + anemia panel today.

## 2019-04-09 NOTE — Assessment & Plan Note (Signed)
Chronic, ongoing with poorly controlled diabetes.  Continue current medication regimen and adjust as needed based on A1C in July.  Consider addition of ACE/ARB after discussion with patient and daughter in one week.

## 2019-04-09 NOTE — Assessment & Plan Note (Signed)
Chronic, ongoing with poor control.  April A1C 10.4, repeat in July.  Continue current medication regimen, will switch to pens.  CCM referral for further involvement and recommendations.

## 2019-04-09 NOTE — Patient Instructions (Signed)
Carbohydrate Counting for Diabetes Mellitus, Adult  Carbohydrate counting is a method of keeping track of how many carbohydrates you eat. Eating carbohydrates naturally increases the amount of sugar (glucose) in the blood. Counting how many carbohydrates you eat helps keep your blood glucose within normal limits, which helps you manage your diabetes (diabetes mellitus). It is important to know how many carbohydrates you can safely have in each meal. This is different for every person. A diet and nutrition specialist (registered dietitian) can help you make a meal plan and calculate how many carbohydrates you should have at each meal and snack. Carbohydrates are found in the following foods:  Grains, such as breads and cereals.  Dried beans and soy products.  Starchy vegetables, such as potatoes, peas, and corn.  Fruit and fruit juices.  Milk and yogurt.  Sweets and snack foods, such as cake, cookies, candy, chips, and soft drinks. How do I count carbohydrates? There are two ways to count carbohydrates in food. You can use either of the methods or a combination of both. Reading "Nutrition Facts" on packaged food The "Nutrition Facts" list is included on the labels of almost all packaged foods and beverages in the [place] It includes:  The serving size.  Information about nutrients in each serving, including the grams (g) of carbohydrate per serving. To use the "Nutrition Facts":  Decide how many servings you will have.  Multiply the number of servings by the number of carbohydrates per serving.  The resulting number is the total amount of carbohydrates that you will be having. Learning standard serving sizes of other foods When you eat carbohydrate foods that are not packaged or do not include "Nutrition Facts" on the label, you need to measure the servings in order to count the amount of carbohydrates:  Measure the foods that you will eat with a food scale or measuring cup, if needed.   Decide how many standard-size servings you will eat.  Multiply the number of servings by 15. Most carbohydrate-rich foods have about 15 g of carbohydrates per serving. ? For example, if you eat 8 oz (170 g) of strawberries, you will have eaten 2 servings and 30 g of carbohydrates (2 servings x 15 g = 30 g).  For foods that have more than one food mixed, such as soups and casseroles, you must count the carbohydrates in each food that is included. The following list contains standard serving sizes of common carbohydrate-rich foods. Each of these servings has about 15 g of carbohydrates:   hamburger bun or  English muffin.   oz (15 mL) syrup.   oz (14 g) jelly.  1 slice of bread.  1 six-inch tortilla.  3 oz (85 g) cooked rice or pasta.  4 oz (113 g) cooked dried beans.  4 oz (113 g) starchy vegetable, such as peas, corn, or potatoes.  4 oz (113 g) hot cereal.  4 oz (113 g) mashed potatoes or  of a large baked potato.  4 oz (113 g) canned or frozen fruit.  4 oz (120 mL) fruit juice.  4-6 crackers.  6 chicken nuggets.  6 oz (170 g) unsweetened dry cereal.  6 oz (170 g) plain fat-free yogurt or yogurt sweetened with artificial sweeteners.  8 oz (240 mL) milk.  8 oz (170 g) fresh fruit or one small piece of fruit.  24 oz (680 g) popped popcorn. Example of carbohydrate counting Sample meal  3 oz (85 g) chicken breast.  6 oz (170 g)   brown rice.  4 oz (113 g) corn.  8 oz (240 mL) milk.  8 oz (170 g) strawberries with sugar-free whipped topping. Carbohydrate calculation 1. Identify the foods that contain carbohydrates: ? Rice. ? Corn. ? Milk. ? Strawberries. 2. Calculate how many servings you have of each food: ? 2 servings rice. ? 1 serving corn. ? 1 serving milk. ? 1 serving strawberries. 3. Multiply each number of servings by 15 g: ? 2 servings rice x 15 g = 30 g. ? 1 serving corn x 15 g = 15 g. ? 1 serving milk x 15 g = 15 g. ? 1 serving  strawberries x 15 g = 15 g. 4. Add together all of the amounts to find the total grams of carbohydrates eaten: ? 30 g + 15 g + 15 g + 15 g = 75 g of carbohydrates total. Summary  Carbohydrate counting is a method of keeping track of how many carbohydrates you eat.  Eating carbohydrates naturally increases the amount of sugar (glucose) in the blood.  Counting how many carbohydrates you eat helps keep your blood glucose within normal limits, which helps you manage your diabetes.  A diet and nutrition specialist (registered dietitian) can help you make a meal plan and calculate how many carbohydrates you should have at each meal and snack. This information is not intended to replace advice given to you by your health care provider. Make sure you discuss any questions you have with your health care provider. Document Released: 10/18/2005 Document Revised: 04/27/2017 Document Reviewed: 03/31/2016 Elsevier Interactive Patient Education  2019 Elsevier Inc.  

## 2019-04-09 NOTE — Assessment & Plan Note (Signed)
Appears to be ongoing issue, currently affecting daily life.  Also has ongoing OA pain.  Will trial Duloxetine which may benefit both mood and pain.  Educated patient and daughter on medication.  Return in one week.

## 2019-04-09 NOTE — Progress Notes (Signed)
New Patient Office Visit  Subjective:  Patient ID: Bethany James, female    DOB: 06/11/1961  Age: 58 y.o. MRN: [id number]  CC:  Chief Complaint  Patient presents with  . Establish Care    concerns of UTI, left side pain. BP, anxiety, depression    HPI Bethany James presents for new patient visit to establish care.  Introduced to Designer, [employer]ellery role and practice setting.  All questions answered.  Her daughteris present at bedside to assit with HPI and ROS.  Patient is requesting Tramadol today, reporting that she was given this in ER 02/20/2019, but only given 10 pills.  Discussed that at first visit this is not protocol and discussed alternate options for pain based on assessment and history.  Currently is being followed by GI for recent GI issues, ulcers.  Bethany James is oldest daughter and caregiver, she has cared for patient for 2 years.  She endorses major caregiver strain, but wishes to continue caring for her mother at home.  Discussed CCM team with daughter, which she is interested in.  DIABETES Currently insulin dependent.  Diagnosed 2 years ago, when she fell at her home and had blood sugar of over 1000.  Was in ICU for two weeks.  Her last A1C on [facility] records 10/04/17 = 14.0 (admission) and then recent 02/21/2019 = 10.4.  Has only been on insulin, was initially placed on this with past hospitalization in 2018.  Daughter is requesting change back to insulin pens vs current vials she is using, so that patient may give self injections and have some independence.  Daughter would continue to prep pen. Hypoglycemic episodes:no Polydipsia/polyuria: no Visual disturbance: no Chest pain: no Paresthesias: no Glucose Monitoring: yes  Accucheck frequency: TID  Fasting glucose: 150-170's  Post prandial: 170's  Evening: 130-140's  Before meals: Taking Insulin?: yes  Long acting insulin: Levemir 10 units at bedtime  Short acting insulin: Novolog 5 units three times a day Blood Pressure  Monitoring: not checking Retinal Examination: Not up to Date Foot Exam: Not up to Date Pneumovax: Not up to Date Influenza: Not up to Date Aspirin: no   ANXIETY/STRESS/DEPRESSION Her daughter discussed patient's history of significant hospitalizations and discussion of possible past injury to brain during hospitalization in April.  "BP dropped so low for so long" that it may have affected her brain.  It was recommended at time to have further neuro evaluation per daughter report.  It is also reported that patient's son passed away two years ago, two months prior to patient fall in 2018.  Her daughter reports patient has always had anxiety and daughter found this out from patient's ex-husband who is still support in patient's life.  Patient also has ongoing OA discomfort to left hip.  Discussed option of Cymbalta being added to regimen which may help both hip pain and mood.  They would like to trial this.  Also discussed use of Tylenol as needed for hip pain (max 3000 MG daily), avoid Ibuprofen or ASA at this time. Duration:uncontrolled Anxious mood: yes  Excessive worrying: yes Irritability: yes  Sweating: no Nausea: no Palpitations:no Hyperventilation: no Panic attacks: yes Agoraphobia: no  Obscessions/compulsions: no Depressed mood: yes Depression screen PHQ 2/9 04/09/2019  Decreased Interest 3  Down, Depressed, Hopeless 3  PHQ - 2 Score 6  Altered sleeping 3  Tired, decreased energy 3  Change in appetite 0  Feeling bad or failure about yourself  3  Trouble concentrating 2  Moving slowly or fidgety/restless 2  Suicidal thoughts 0  PHQ-9 Score 19  Difficult doing work/chores Very difficult   Anhedonia: no Weight changes: no Insomnia: yes hard to stay asleep  Hypersomnia: no Fatigue/loss of energy: no Feelings of worthlessness: yes Feelings of guilt: yes Impaired concentration/indecisiveness: yes Suicidal ideations: no  Crying spells: yes Recent Stressors/Life Changes: no    Relationship problems: no   Family stress: no     Financial stress: no    Job stress: no    Recent death/loss: no  GAD 7 : Generalized Anxiety Score 04/09/2019  Nervous, Anxious, on Edge 3  Control/stop worrying 3  Worry too much - different things 3  Trouble relaxing 3  Restless 2  Easily annoyed or irritable 2  Afraid - awful might happen 2  Total GAD 7 Score 18  Anxiety Difficulty Very difficult    HYPERTENSION / HYPERLIPIDEMIA No current BP meds or statin.  Discussed with her daughter addition of ACE/ARB in future at low dose for kidney protection + possible addition of statin for HLD.  She agrees to labs today and further discussion next week. Duration of hypertension: years BP monitoring frequency: not checking BP range:  Duration of hyperlipidemia: years Recent stressors: yes, hospital visit in April Recurrent headaches: no Visual changes: no Palpitations: no Dyspnea: no Chest pain: no Lower extremity edema: no Dizzy/lightheaded: no  URINARY SYMPTOMS Currently being followed by GI for recent gastric ulcer with hemorrhage in April 2020 with blood loss.  She has complaint of left side abdominal pain, but no urinary symptoms.  Currently on Protonix 40 MG daily.  Was last seen by GI 03/08/2019 and is to return in 6 months for screening colonoscopy.  Her daughter feels pain is more related to left hip, as patient often points to hip.  Patient unable to provide urine sample today, even after hydration.  She has no fever or N&V.   Dysuria: no Urinary frequency: no Urgency: no Small volume voids: no Symptom severity: mild Urinary incontinence: at baseline wears Depends Foul odor: no Hematuria: no Abdominal pain: no Back pain: no Suprapubic pain/pressure: no Flank pain: no Fever:  yes, no, subjective and low grade Vomiting: no Previous urinary tract infection: not recent Recurrent urinary tract infection: no Sexual activity: none History of sexually transmitted disease:  no Treatments attempted: increasing fluids   Past Medical History:  Diagnosis Date  . Anemia   . Diabetes mellitus without complication [facility])     Past Surgical History:  Procedure Laterality Date  . ABDOMINAL HYSTERECTOMY    . CHOLECYSTECTOMY    . ESOPHAGOGASTRODUODENOSCOPY N/A 02/21/2019   Procedure: ESOPHAGOGASTRODUODENOSCOPY (EGD);  Surgeon: Toledo, Benay Pike, MD;  Location: [facility] ENDOSCOPY;  Service: Gastroenterology;  Laterality: N/A;  ICU  . ESOPHAGOGASTRODUODENOSCOPY (EGD) WITH PROPOFOL N/A 02/24/2019   Procedure: ESOPHAGOGASTRODUODENOSCOPY (EGD) WITH PROPOFOL;  Surgeon: Toledo, Benay Pike, MD;  Location: [facility] ENDOSCOPY;  Service: Gastroenterology;  Laterality: N/A;  . stomach ulcers removal      Family History  Problem Relation Age of Onset  . Asthma Mother   . Ulcers Mother   . Liver disease Mother   . Diabetes Father   . Obesity Sister   . Kidney cancer Brother   . Alzheimer's disease Paternal Grandmother     Social History   Socioeconomic History  . Marital status: Single    Spouse name: Not on file  . Number of children: Not on file  . Years of education: Not on file  . Highest education level: Not on file  Occupational History  . Not on file  Social Needs  . Financial resource strain: Not on file  . Food insecurity:    Worry: Not on file    Inability: Not on file  . Transportation needs:    Medical: Not on file    Non-medical: Not on file  Tobacco Use  . Smoking status: Never Smoker  . Smokeless tobacco: Never Used  Substance and Sexual Activity  . Alcohol use: Not Currently  . Drug use: Not Currently  . Sexual activity: Not on file  Lifestyle  . Physical activity:    Days per week: Not on file    Minutes per session: Not on file  . Stress: Not on file  Relationships  . Social connections:    Talks on phone: Not on file    Gets together: Not on file    Attends religious service: Not on file    Active member of club or organization: Not on  file    Attends meetings of clubs or organizations: Not on file    Relationship status: Not on file  . Intimate partner violence:    Fear of current or ex partner: Not on file    Emotionally abused: Not on file    Physically abused: Not on file    Forced sexual activity: Not on file  Other Topics Concern  . Not on file  Social History Narrative  . Not on file    ROS Review of Systems  Constitutional: Negative for activity change, appetite change, diaphoresis, fatigue and fever.  Respiratory: Negative for cough, chest tightness and shortness of breath.   Cardiovascular: Negative for chest pain, palpitations and leg swelling.  Gastrointestinal: Negative for abdominal distention, abdominal pain, constipation, diarrhea, nausea and vomiting.  Endocrine: Negative for cold intolerance, heat intolerance, polydipsia, polyphagia and polyuria.  Musculoskeletal: Positive for arthralgias.  Neurological: Negative for dizziness, syncope, weakness, light-headedness, numbness and headaches.  Psychiatric/Behavioral: Positive for decreased concentration and sleep disturbance. Negative for self-injury and suicidal ideas. The patient is nervous/anxious.     Objective:   Today's Vitals: BP 131/88   Pulse 89   Temp 98.8 F (37.1 C) (Oral)   Ht 4' 10" (1.473 m)   Wt 105 lb (47.6 kg)   SpO2 100%   BMI 21.95 kg/m   Physical Exam Vitals signs and nursing note reviewed.  Constitutional:      General: She is awake. She is not in acute distress.    Appearance: She is well-developed. She is not ill-appearing.     Comments: Frail in appearance African American female.  HENT:     Head: Normocephalic.     Right Ear: Hearing normal.     Left Ear: Hearing normal.     Nose: Nose normal.     Mouth/Throat:     Mouth: Mucous membranes are moist.  Eyes:     General: Lids are normal.        Right eye: No discharge.        Left eye: No discharge.     Conjunctiva/sclera: Conjunctivae normal.     Pupils:  Pupils are equal, round, and reactive to light.  Neck:     Musculoskeletal: Normal range of motion and neck supple.     Thyroid: No thyromegaly.     Vascular: No carotid bruit or JVD.  Cardiovascular:     Rate and Rhythm: Normal rate and regular rhythm.     Heart sounds: Normal heart sounds. No murmur. No gallop.  Pulmonary:     Effort: Pulmonary effort is normal. No accessory muscle usage or respiratory distress.     Breath sounds: Normal breath sounds.  Abdominal:     General: Bowel sounds are normal.     Palpations: Abdomen is soft. There is no hepatomegaly or splenomegaly.     Tenderness: There is no abdominal tenderness. There is no right CVA tenderness or left CVA tenderness.     Comments: No abdominal tenderness or CVA tenderness.  Tenderness more over left hip area.    Musculoskeletal:     Right hip: She exhibits decreased strength. She exhibits normal range of motion, no tenderness, no swelling, no crepitus and no laceration.     Left hip: She exhibits decreased range of motion, decreased strength and tenderness. She exhibits no swelling, no crepitus and no laceration.     Right lower leg: No edema.     Left lower leg: No edema.  Lymphadenopathy:     Cervical: No cervical adenopathy.  Skin:    General: Skin is warm and dry.  Neurological:     Mental Status: She is alert and oriented to person, place, and time.  Psychiatric:        Attention and Perception: Attention normal.        Mood and Affect: Affect is flat.        Speech: Speech normal.        Behavior: Behavior normal. Behavior is cooperative.        Thought Content: Thought content normal.        Judgment: Judgment normal.     Assessment & Plan:   Problem List Items Addressed This Visit      Cardiovascular and Mediastinum   Hypertensive heart/kidney disease without HF and with CKD stage III (HCC)    No current BP medications and BP at goal.  Consider addition of low dose ACE/ARB for kidney protection, will  further discuss with patient and daughter next week.  CMP today.          Endocrine   Type 2 diabetes mellitus with hyperglycemia (HCC) - Primary    Chronic, ongoing with poor control.  April A1C 10.4, repeat in July.  Continue current medication regimen, will switch to pens.  CCM referral for further involvement and recommendations.        Relevant Medications   insulin aspart (NOVOLOG) 100 UNIT/ML FlexPen   Insulin Detemir (LEVEMIR) 100 UNIT/ML Pen   Other Relevant Orders   Lipid Panel w/o Chol/HDL Ratio   Ambulatory referral to Ophthalmology   Ambulatory referral to Chronic Care Management Services   Type 2 diabetes mellitus with nephropathy (HCC)    Chronic, ongoing with poor diabetes control and CKD 3.  Continue current medication regimen at this time, next A1C in July and adjust as needed.  Unable to provide urine sample today.  CMP today.  Will have return in one week and discuss addition of low dose ACE/ARB for kidney protection.  Refer to nephrology if worsening labs noted.      Relevant Medications   insulin aspart (NOVOLOG) 100 UNIT/ML FlexPen   Insulin Detemir (LEVEMIR) 100 UNIT/ML Pen   Other Relevant Orders   Comp Met (CMET)   CKD stage 3 due to type 2 diabetes mellitus (HCC)    Chronic, ongoing with poorly controlled diabetes.  Continue current medication regimen and adjust as needed based on A1C in July.  Consider addition of ACE/ARB after discussion with patient and daughter in one  week.        Relevant Medications   insulin aspart (NOVOLOG) 100 UNIT/ML FlexPen   Insulin Detemir (LEVEMIR) 100 UNIT/ML Pen   Hyperlipidemia associated with type 2 diabetes mellitus (HCC)    Lipid panel and CMP today and discuss addition of statin with daughter and patient next week.      Relevant Medications   insulin aspart (NOVOLOG) 100 UNIT/ML FlexPen   Insulin Detemir (LEVEMIR) 100 UNIT/ML Pen     Other   Generalized anxiety disorder    Appears to be ongoing issue, currently  affecting daily life.  Also has ongoing OA pain.  Will trial Duloxetine which may benefit both mood and pain.  Educated patient and daughter on medication.  Return in one week.        Relevant Medications   DULoxetine (CYMBALTA) 30 MG capsule   Depression, major, single episode, moderate (HCC)    Appears to be ongoing issue for several years.  Will trial Duloxetine which may benefit mood and OA pain.  Denies SI/HI.  PHQ 9 = 19.  Return in one week.      Relevant Medications   DULoxetine (CYMBALTA) 30 MG capsule   Generalized abdominal pain    Unable to provide urine and WNL assessment.  Recommend increased hydration.  Suspect left sided pain is from left hip, where patient reports pain.  Recommend if increased symptoms or fever to notify provider immediately.  CMP and CBC + anemia panel today.      Relevant Orders   CBC with Differential/Platelet   Anemia panel    Other Visit Diagnoses    Memory changes       Referral to neuro for further assessment per daughter request.   Relevant Orders   Ambulatory referral to Neurology   Thyroid disorder screen       TSH labs today   Relevant Orders   Thyroid Panel With TSH      Outpatient Encounter Medications as of 04/09/2019  Medication Sig  . [DISCONTINUED] insulin aspart (NOVOLOG) 100 UNIT/ML injection Inject 5 Units into the skin 3 (three) times daily before meals.  . [DISCONTINUED] insulin detemir (LEVEMIR) 100 UNIT/ML injection Inject 0.1 mLs (10 Units total) into the skin at bedtime.  . [DISCONTINUED] pantoprazole (PROTONIX) 40 MG tablet Take 1 tablet (40 mg total) by mouth 2 (two) times daily before a meal.  . DULoxetine (CYMBALTA) 30 MG capsule Take one tablet (30 MG) daily by mouth for one week and then may increase to two tablets (60 MG) daily by mouth.  . ferrous sulfate 325 (65 FE) MG EC tablet Take 1 tablet (325 mg total) by mouth 2 (two) times daily with a meal for 30 days.  . insulin aspart (NOVOLOG) 100 UNIT/ML FlexPen  Inject 5 Units into the skin 3 (three) times daily with meals.  . Insulin Detemir (LEVEMIR) 100 UNIT/ML Pen Inject 10 Units into the skin daily.  . [DISCONTINUED] sertraline (ZOLOFT) 25 MG tablet Take 1 tablet (25 mg total) by mouth daily.  . [DISCONTINUED] traMADol (ULTRAM) 50 MG tablet Take 1 tablet (50 mg total) by mouth 2 (two) times daily as needed for severe pain.   No facility-administered encounter medications on file as of 04/09/2019.     Follow-up: Return in about 1 week (around 04/16/2019) for Follow-up.   Venita Lick, NP

## 2019-04-09 NOTE — Assessment & Plan Note (Signed)
Appears to be ongoing issue for several years.  Will trial Duloxetine which may benefit mood and OA pain.  Denies SI/HI.  PHQ 9 = 19.  Return in one week.

## 2019-04-09 NOTE — Assessment & Plan Note (Signed)
Lipid panel and CMP today and discuss addition of statin with daughter and patient next week.

## 2019-04-09 NOTE — Assessment & Plan Note (Signed)
Chronic, ongoing with poor diabetes control and CKD 3.  Continue current medication regimen at this time, next A1C in July and adjust as needed.  Unable to provide urine sample today.  CMP today.  Will have return in one week and discuss addition of low dose ACE/ARB for kidney protection.  Refer to nephrology if worsening labs noted.

## 2019-04-10 LAB — THYROID PANEL WITH TSH
Free Thyroxine Index: 1.6 (ref 1.2–4.9)
T3 Uptake Ratio: 21 % — ABNORMAL LOW (ref 24–39)
T4, Total: 7.5 ug/dL (ref 4.5–12.0)
TSH: 6.06 u[IU]/mL — ABNORMAL HIGH (ref 0.450–4.500)

## 2019-04-10 LAB — COMPREHENSIVE METABOLIC PANEL
ALT: 7 IU/L (ref 0–32)
AST: 19 IU/L (ref 0–40)
Albumin/Globulin Ratio: 1.5 (ref 1.2–2.2)
Albumin: 4.3 g/dL (ref 3.8–4.9)
Alkaline Phosphatase: 79 IU/L (ref 39–117)
BUN/Creatinine Ratio: 22 (ref 9–23)
BUN: 18 mg/dL (ref 6–24)
Bilirubin Total: 0.3 mg/dL (ref 0.0–1.2)
CO2: 24 mmol/L (ref 20–29)
Calcium: 9.7 mg/dL (ref 8.7–10.2)
Chloride: 101 mmol/L (ref 96–106)
Creatinine, Ser: 0.82 mg/dL (ref 0.57–1.00)
GFR calc Af Amer: 92 mL/min/{1.73_m2} (ref >59)
GFR calc non Af Amer: 80 mL/min/{1.73_m2} (ref >59)
Globulin, Total: 2.8 g/dL (ref 1.5–4.5)
Glucose: 224 mg/dL — ABNORMAL HIGH (ref 65–99)
Potassium: 4.7 mmol/L (ref 3.5–5.2)
Sodium: 138 mmol/L (ref 134–144)
Total Protein: 7.1 g/dL (ref 6.0–8.5)

## 2019-04-10 LAB — CBC WITH DIFFERENTIAL/PLATELET
Basophils Absolute: 0 10*3/uL (ref 0.0–0.2)
Basos: 0 %
EOS (ABSOLUTE): 0.1 10*3/uL (ref 0.0–0.4)
Eos: 2 %
Hemoglobin: 13.6 g/dL (ref 11.1–15.9)
Immature Grans (Abs): 0 10*3/uL (ref 0.0–0.1)
Immature Granulocytes: 0 %
Lymphocytes Absolute: 1.9 10*3/uL (ref 0.7–3.1)
Lymphs: 30 %
MCH: 30.6 pg (ref 26.6–33.0)
MCHC: 33.7 g/dL (ref 31.5–35.7)
MCV: 91 fL (ref 79–97)
Monocytes Absolute: 0.4 10*3/uL (ref 0.1–0.9)
Monocytes: 7 %
Neutrophils Absolute: 3.8 10*3/uL (ref 1.4–7.0)
Neutrophils: 61 %
Platelets: 384 10*3/uL (ref 150–450)
RBC: 4.45 x10E6/uL (ref 3.77–5.28)
RDW: 12.3 % (ref 11.7–15.4)
WBC: 6.2 10*3/uL (ref 3.4–10.8)

## 2019-04-10 LAB — ANEMIA PANEL
Ferritin: 56 ng/mL (ref 15–150)
Folate, Hemolysate: 346 ng/mL
Folate, RBC: 856 ng/mL (ref >498)
Hematocrit: 40.4 % (ref 34.0–46.6)
Iron Saturation: 24 % (ref 15–55)
Iron: 53 ug/dL (ref 27–159)
Retic Ct Pct: 0.8 % (ref 0.6–2.6)
Total Iron Binding Capacity: 220 ug/dL — ABNORMAL LOW (ref 250–450)
UIBC: 167 ug/dL (ref 131–425)
Vitamin B-12: 611 pg/mL (ref 232–1245)

## 2019-04-10 LAB — LIPID PANEL W/O CHOL/HDL RATIO
Cholesterol, Total: 113 mg/dL (ref 100–199)
HDL: 67 mg/dL (ref >39)
LDL Calculated: 32 mg/dL (ref 0–99)
Triglycerides: 71 mg/dL (ref 0–149)
VLDL Cholesterol Cal: 14 mg/dL (ref 5–40)

## 2019-04-11 ENCOUNTER — Telehealth: Payer: Self-pay | Admitting: Nurse Practitioner

## 2019-04-11 NOTE — Telephone Encounter (Signed)
Will speak with daughter

## 2019-04-11 NOTE — Telephone Encounter (Signed)
noted 

## 2019-04-11 NOTE — Telephone Encounter (Signed)
Spoke to daughter, she is having vision issues. Does need to see provider. However, they do not want to go back to [facility]. She would prefer referral to [facility] in [place] (needed for insurance purposes). Did call [facility] and request DM Eye form to be faxed over.

## 2019-04-11 NOTE — Telephone Encounter (Signed)
Copied from [place] [phone]. Topic: Referral - Question >> Apr 11, 2019  8:20 AM Alanda Slim E wrote: Reason for CRM: Darlene from [facility] called to inquire about the referral they received for this Pt. The Pt was there in January and was sent by Disability. Th eye center wants to verify if Henrine Screws wants hers to be seen. Pt was due back there originally next January / please advise

## 2019-04-12 ENCOUNTER — Telehealth: Payer: Self-pay | Admitting: Nurse Practitioner

## 2019-04-12 MED ORDER — INSULIN PEN NEEDLE 31G X 4 MM MISC: 4.0000 "pen " | Freq: Four times a day (QID) | 12 refills | Status: DC

## 2019-04-12 NOTE — Telephone Encounter (Signed)
Verbal order provided to RN at [place]

## 2019-04-12 NOTE — Telephone Encounter (Signed)
Copied from [place] [phone]. Topic: General - Other >> Apr 12, 2019 10:10 AM Yvette Rack wrote: Reason for CRM: Pt daughter Rubie Maid stated pt needs a Rx for the needles to go on the pens and and Rx for Depends. Rubie Maid also asked if pt should continue taking iron. Cb# [phone]

## 2019-04-12 NOTE — Telephone Encounter (Unsigned)
Copied from [place] [phone]. Topic: Quick Communication - Home Health Verbal Orders >> Apr 12, 2019  3:35 PM Sharene Skeans wrote: Caller/Agency: [facility] Number: [phone] Requesting extend PT Frequency: 1x a week for 2 weeks

## 2019-04-13 ENCOUNTER — Emergency Department
Admission: EM | Admit: 2019-04-13 | Discharge: 2019-04-13 | Disposition: A | Discharge status: Home / Self Care | Payer: Medicaid Other | Attending: Emergency Medicine | Admitting: Emergency Medicine

## 2019-04-13 ENCOUNTER — Other Ambulatory Visit: Payer: Self-pay

## 2019-04-13 ENCOUNTER — Encounter: Payer: Self-pay | Admitting: Emergency Medicine

## 2019-04-13 DIAGNOSIS — Z794 Long term (current) use of insulin: Secondary | ICD-10-CM | POA: Insufficient documentation

## 2019-04-13 DIAGNOSIS — E119 Type 2 diabetes mellitus without complications: Secondary | ICD-10-CM

## 2019-04-13 DIAGNOSIS — Z20828 Contact with and (suspected) exposure to other viral communicable diseases: Secondary | ICD-10-CM | POA: Insufficient documentation

## 2019-04-13 DIAGNOSIS — J029 Acute pharyngitis, unspecified: Secondary | ICD-10-CM | POA: Diagnosis not present

## 2019-04-13 DIAGNOSIS — I129 Hypertensive chronic kidney disease with stage 1 through stage 4 chronic kidney disease, or unspecified chronic kidney disease: Secondary | ICD-10-CM | POA: Diagnosis not present

## 2019-04-13 DIAGNOSIS — N183 Chronic kidney disease, stage 3 (moderate): Secondary | ICD-10-CM | POA: Insufficient documentation

## 2019-04-13 DIAGNOSIS — E1122 Type 2 diabetes mellitus with diabetic chronic kidney disease: Secondary | ICD-10-CM | POA: Diagnosis not present

## 2019-04-13 LAB — CBC WITH DIFFERENTIAL/PLATELET
Abs Immature Granulocytes: 0.02 10*3/uL (ref 0.00–0.07)
Basophils Absolute: 0 10*3/uL (ref 0.0–0.1)
Basophils Relative: 1 %
Eosinophils Absolute: 0.1 10*3/uL (ref 0.0–0.5)
Eosinophils Relative: 1 %
HCT: 39.7 % (ref 36.0–46.0)
Hemoglobin: 13.7 g/dL (ref 12.0–15.0)
Immature Granulocytes: 0 %
Lymphocytes Relative: 29 %
Lymphs Abs: 2.1 10*3/uL (ref 0.7–4.0)
MCH: 30.6 pg (ref 26.0–34.0)
MCHC: 34.5 g/dL (ref 30.0–36.0)
MCV: 88.8 fL (ref 80.0–100.0)
Monocytes Absolute: 0.5 10*3/uL (ref 0.1–1.0)
Monocytes Relative: 7 %
Neutro Abs: 4.5 10*3/uL (ref 1.7–7.7)
Neutrophils Relative %: 62 %
Platelets: 333 10*3/uL (ref 150–400)
RBC: 4.47 MIL/uL (ref 3.87–5.11)
RDW: 12 % (ref 11.5–15.5)
WBC: 7.2 10*3/uL (ref 4.0–10.5)
nRBC: 0 % (ref 0.0–0.2)

## 2019-04-13 LAB — BASIC METABOLIC PANEL
Anion gap: 9 (ref 5–15)
BUN: 14 mg/dL (ref 6–20)
CO2: 26 mmol/L (ref 22–32)
Calcium: 9.8 mg/dL (ref 8.9–10.3)
Chloride: 101 mmol/L (ref 98–111)
Creatinine, Ser: 0.73 mg/dL (ref 0.44–1.00)
GFR calc Af Amer: >60 mL/min (ref >60)
GFR calc non Af Amer: >60 mL/min (ref >60)
Glucose, Bld: 172 mg/dL — ABNORMAL HIGH (ref 70–99)
Potassium: 4 mmol/L (ref 3.5–5.1)
Sodium: 136 mmol/L (ref 135–145)

## 2019-04-13 MED ORDER — LIDOCAINE VISCOUS HCL 2 % MT SOLN: 15.0000 mL | OROMUCOSAL | 0 refills | Status: DC | PRN

## 2019-04-13 MED ORDER — MENTHOL 3 MG MT LOZG
1.0000 | LOZENGE | OROMUCOSAL | Status: DC | PRN
  Filled 2019-04-13: qty 9

## 2019-04-13 NOTE — Telephone Encounter (Signed)
Easiest way to is to just them on an RX pad. I will work on this today and see if one of the other providers can sign it in absentee.

## 2019-04-13 NOTE — ED Triage Notes (Signed)
Patient presents to the ED with sore throat and throat swelling x 2 days.  Patient began taking a new medication, "tramadolin" 3 days ago.  Daughter states it was shortly after that patient began to complain of sore throat.  Patient is in no obvious distress at this time.  Patient recently had surgery.  Daughter states, "with all that is going on, I would also like her to be tested because she is around my kids and everything."

## 2019-04-13 NOTE — Telephone Encounter (Signed)
RX completed. Left VM for daughter to find out where they would like RX for depends sent.

## 2019-04-13 NOTE — ED Notes (Signed)
This RN to bedside at this time, NAD noted at this time. Pt states "I just wanted to know when the doctor was coming in". Pt visualized in NAD, however is noted to be trembling at this time.

## 2019-04-13 NOTE — Telephone Encounter (Signed)
I figured that was easiest way, as I searched on Epic for order and could not find.  Thank you for your help.:)

## 2019-04-13 NOTE — ED Notes (Signed)
NAD noted at time of D/C. Pt taken to lobby via wheelchair by this RN. This RN reviewed D/C instructions with patient's daughter after assisting patient into the car. Pt and daughter deny comments/concerns.

## 2019-04-13 NOTE — Discharge Instructions (Addendum)
Your blood test today are okay.  We sent a coronavirus test to [facility] which will result in 1 to 2 days.  Please use menthol cough drops and lidocaine solution as prescribed to control your sore throat.  Be sure to eat regularly and drink fluids to stay hydrated.  Follow-up with your doctor next week for further evaluation if your symptoms persist.

## 2019-04-13 NOTE — ED Provider Notes (Signed)
[facility] Emergency Department Provider Note  ____________________________________________  Time seen: Approximately 4:45 PM  I have reviewed the triage vital signs and the nursing notes.   HISTORY  Chief Complaint Oral Swelling    HPI Bethany James is a 58 y.o. female with a history of hyperlipidemia, hypertension, CKD, diabetes who was recently hospitalized with DKA, complicated by cardiac arrest.  Today she presents with sore throat for the past 2 days which she attributes to starting Cymbalta 3 or 4 days ago by her doctor.  She has no cough fevers chills chest pain shortness of breath.  She is eating well although she reports somewhat decreased oral intake due to the sore throat.  She reports that her energy level is normal, denies any change in her sense of smell or taste.  Sore throat symptoms are constant, worse with eating, no alleviating factors.  Not associated with heartburn symptoms, not worse in the morning.  Nonradiating.      Past Medical History:  Diagnosis Date  . Anemia   . Diabetes mellitus without complication [facility])      Patient Active Problem List   Diagnosis Date Noted  . Type 2 diabetes mellitus with nephropathy ([place]) 04/09/2019  . Generalized anxiety disorder 04/09/2019  . Depression, major, single episode, moderate ([place]) 04/09/2019  . Hypertensive heart/kidney disease without HF and with CKD stage III ([place]) 04/09/2019  . CKD stage 3 due to type 2 diabetes mellitus ([place]) 04/09/2019  . Hyperlipidemia associated with type 2 diabetes mellitus ([place]) 04/09/2019  . Generalized abdominal pain 04/09/2019  . Type 2 diabetes mellitus with hyperglycemia ([place]) 02/20/2019     Past Surgical History:  Procedure Laterality Date  . ABDOMINAL HYSTERECTOMY    . CHOLECYSTECTOMY    . ESOPHAGOGASTRODUODENOSCOPY N/A 02/21/2019   Procedure: ESOPHAGOGASTRODUODENOSCOPY (EGD);  Surgeon: Toledo, Benay Pike, MD;  Location: [facility] ENDOSCOPY;  Service:  Gastroenterology;  Laterality: N/A;  ICU  . ESOPHAGOGASTRODUODENOSCOPY (EGD) WITH PROPOFOL N/A 02/24/2019   Procedure: ESOPHAGOGASTRODUODENOSCOPY (EGD) WITH PROPOFOL;  Surgeon: Toledo, Benay Pike, MD;  Location: [facility] ENDOSCOPY;  Service: Gastroenterology;  Laterality: N/A;  . stomach ulcers removal       Prior to Admission medications   Medication Sig Start Date End Date Taking? Authorizing Provider  DULoxetine (CYMBALTA) 30 MG capsule Take one tablet (30 MG) daily by mouth for one week and then may increase to two tablets (60 MG) daily by mouth. 04/09/19   Cannady, Henrine Screws T, NP  ferrous sulfate 325 (65 FE) MG EC tablet Take 1 tablet (325 mg total) by mouth 2 (two) times daily with a meal for 30 days. 02/28/19 03/30/19  Hillary Bow, MD  insulin aspart (NOVOLOG) 100 UNIT/ML FlexPen Inject 5 Units into the skin 3 (three) times daily with meals. 04/09/19   Cannady, Henrine Screws T, NP  Insulin Detemir (LEVEMIR) 100 UNIT/ML Pen Inject 10 Units into the skin daily. 04/09/19   Cannady, Henrine Screws T, NP  Insulin Pen Needle 31G X 4 MM MISC 4 pens by Does not apply route 4 (four) times daily. To be used with insulin pens. 04/12/19   Cannady, Henrine Screws T, NP  lidocaine (XYLOCAINE) 2 % solution Use as directed 15 mLs in the mouth or throat every 2 (two) hours as needed (sore throat). Gargle and spit out 04/13/19   Carrie Mew, MD     Allergies Patient has no known allergies.   Family History  Problem Relation Age of Onset  . Asthma Mother   . Ulcers Mother   .  Liver disease Mother   . Diabetes Father   . Obesity Sister   . Kidney cancer Brother   . Alzheimer's disease Paternal Grandmother     Social History Social History   Tobacco Use  . Smoking status: Never Smoker  . Smokeless tobacco: Never Used  Substance Use Topics  . Alcohol use: Not Currently  . Drug use: Not Currently    Review of Systems  Constitutional:   No fever or chills.  ENT:   Positive sore throat. No rhinorrhea. Cardiovascular:    No chest pain or syncope. Respiratory:   No dyspnea or cough. Gastrointestinal:   Negative for abdominal pain, vomiting and diarrhea.  Musculoskeletal:   Negative for focal pain or swelling All other systems reviewed and are negative except as documented above in ROS and HPI.  ____________________________________________   PHYSICAL EXAM:  VITAL SIGNS: ED Triage Vitals  Enc Vitals Group     BP 04/13/19 1544 (!) 141/100     Pulse Rate 04/13/19 1544 95     Resp 04/13/19 1544 18     Temp 04/13/19 1544 98.5 F (36.9 C)     Temp Source 04/13/19 1544 Oral     SpO2 04/13/19 1544 99 %     Weight 04/13/19 1509 105 lb (47.6 kg)     Height 04/13/19 1509 4\' 10"  (1.473 m)     Head Circumference --      Peak Flow --      Pain Score 04/13/19 1508 6     Pain Loc --      Pain Edu? --      Excl. in GC? --     Vital signs reviewed, nursing assessments reviewed.   Constitutional:   Alert and oriented. Non-toxic appearance. Eyes:   Conjunctivae are normal. EOMI. PERRL. ENT      Head:   Normocephalic and atraumatic.      Nose:   No congestion/rhinnorhea.       Mouth/Throat:   MMM, mild pharyngeal erythema. No peritonsillar mass.       Neck:   No meningismus. Full ROM. Hematological/Lymphatic/Immunilogical:   No cervical lymphadenopathy. Cardiovascular:   RRR. Symmetric bilateral radial and DP pulses.  No murmurs. Cap refill less than 2 seconds. Respiratory:   Normal respiratory effort without tachypnea/retractions. Breath sounds are clear and equal bilaterally. No wheezes/rales/rhonchi. Gastrointestinal:   Soft and nontender. Non distended. There is no CVA tenderness.  No rebound, rigidity, or guarding. Musculoskeletal:   Normal range of motion in all extremities. No joint effusions.  No lower extremity tenderness.  No edema. Neurologic:   Normal speech and language.  Motor grossly intact. No acute focal neurologic deficits are appreciated.  Skin:    Skin is warm, dry and intact. No rash  noted.  No petechiae, purpura, or bullae.  ____________________________________________    LABS (pertinent positives/negatives) (all labs ordered are listed, but only abnormal results are displayed) Labs Reviewed  BASIC METABOLIC PANEL - Abnormal; Notable for the following components:      Result Value   Glucose, Bld 172 (*)    All other components within normal limits  NOVEL CORONAVIRUS, NAA (HOSPITAL ORDER, SEND-OUT TO REF LAB)  CBC WITH DIFFERENTIAL/PLATELET   ____________________________________________   EKG    ____________________________________________    RADIOLOGY  No results found.  ____________________________________________   PROCEDURES Procedures  ____________________________________________    CLINICAL IMPRESSION / ASSESSMENT AND PLAN / ED COURSE  Medications ordered in the ED: Medications  menthol-cetylpyridinium (CEPACOL) lozenge 3  mg (has no administration in time range)    Pertinent labs & imaging results that were available during my care of the patient were reviewed by me and considered in my medical decision making (see chart for details).  Debbe OdeaJamica Bottari was evaluated in Emergency Department on 04/13/2019 for the symptoms described in the history of present illness. She was evaluated in the context of the global COVID-19 pandemic, which necessitated consideration that the patient might be at risk for infection with the SARS-CoV-2 virus that causes COVID-19. Institutional protocols and algorithms that pertain to the evaluation of patients at risk for COVID-19 are in a state of rapid change based on information released by regulatory bodies including the CDC and federal and state organizations. These policies and algorithms were followed during the patient's care in the ED.   Patient presents with sore throat.  There is some mild erythema.  This could possibly be related to GERD or viral illness or dryness or diet.  Recommend viscous lidocaine and  menthol lozenges.  Hydration, regular eating.  Due to her recent severe illness and uncontrolled diabetes, I checked labs which are unremarkable.  Coronavirus test ordered for [facility] which can be followed up.      ____________________________________________   FINAL CLINICAL IMPRESSION(S) / ED DIAGNOSES    Final diagnoses:  Sore throat  Type 2 diabetes mellitus without complication, with long-term current use of insulin [facility](HCC)     ED Discharge Orders         Ordered    lidocaine (XYLOCAINE) 2 % solution  Every 2 hours PRN     04/13/19 1644          Portions of this note were generated with dragon dictation software. Dictation errors may occur despite best attempts at proofreading.   Sharman CheekStafford, Kaidence Callaway, MD 04/13/19 [phone]

## 2019-04-14 LAB — NOVEL CORONAVIRUS, NAA (HOSP ORDER, SEND-OUT TO REF LAB; TAT 18-24 HRS): SARS-CoV-2, NAA: NOT DETECTED

## 2019-04-16 ENCOUNTER — Telehealth: Payer: Self-pay | Admitting: Emergency Medicine

## 2019-04-16 NOTE — Telephone Encounter (Signed)
Called patient to give covid 19 result.  Gave to POA.  Bethany James.

## 2019-04-16 NOTE — Telephone Encounter (Signed)
Left message on machine for daughter to return call to the office.

## 2019-04-17 ENCOUNTER — Telehealth: Payer: Medicaid Other

## 2019-04-17 ENCOUNTER — Ambulatory Visit: Payer: Medicaid Other | Admitting: Nurse Practitioner

## 2019-04-17 NOTE — Telephone Encounter (Signed)
Pt to be seen today. Will give RX then.

## 2019-04-23 ENCOUNTER — Telehealth: Payer: Self-pay | Admitting: Nurse Practitioner

## 2019-04-23 NOTE — Telephone Encounter (Signed)
Spoke with Raquel Sarna and informed her of negative results.  She reports patient's daughter has been abrasive at times to home health, that she takes good care of patient, but is very involved and does not always communicate well with home health team.

## 2019-04-23 NOTE — Telephone Encounter (Signed)
Copied from [place] [id number]. Topic: General - Other >> Apr 20, 2019  3:19 PM Rainey Pines A wrote: Raquel Sarna with [facility] stated that patient missed her last visit and has not been updated on patients covid results and would like a callback. Patient only has one home health visit left and Raquel Sarna is concerned  and would like a callback [phone]

## 2019-04-24 ENCOUNTER — Encounter: Payer: Self-pay | Admitting: Nurse Practitioner

## 2019-04-24 DIAGNOSIS — K269 Duodenal ulcer, unspecified as acute or chronic, without hemorrhage or perforation: Secondary | ICD-10-CM | POA: Insufficient documentation

## 2019-04-24 DIAGNOSIS — R7989 Other specified abnormal findings of blood chemistry: Secondary | ICD-10-CM | POA: Insufficient documentation

## 2019-04-25 ENCOUNTER — Ambulatory Visit: Payer: Medicaid Other | Admitting: Nurse Practitioner

## 2019-04-25 ENCOUNTER — Ambulatory Visit: Payer: Medicaid Other | Admitting: Pharmacist

## 2019-04-25 ENCOUNTER — Encounter: Payer: Self-pay | Admitting: Nurse Practitioner

## 2019-04-25 ENCOUNTER — Other Ambulatory Visit: Payer: Self-pay

## 2019-04-25 VITALS — BP 142/87 | HR 85 | Temp 98.3°F | Wt 105.0 lb

## 2019-04-25 DIAGNOSIS — E1165 Type 2 diabetes mellitus with hyperglycemia: Secondary | ICD-10-CM

## 2019-04-25 DIAGNOSIS — J029 Acute pharyngitis, unspecified: Secondary | ICD-10-CM

## 2019-04-25 DIAGNOSIS — F321 Major depressive disorder, single episode, moderate: Secondary | ICD-10-CM

## 2019-04-25 DIAGNOSIS — Z794 Long term (current) use of insulin: Secondary | ICD-10-CM | POA: Diagnosis not present

## 2019-04-25 MED ORDER — PENICILLIN V POTASSIUM 500 MG PO TABS: 500.0000 mg | ORAL_TABLET | Freq: Two times a day (BID) | ORAL | 0 refills | Status: AC

## 2019-04-25 NOTE — Chronic Care Management (AMB) (Signed)
Chronic Care Management   Note  04/25/2019 Name: Bethany James MRN: [id number] DOB: November 29, 1960   Subjective:  Bethany James is a 58 y.o. year old female who is a primary care patient of Cannady, Barbaraann Faster, NP. The CCM team was consulted for assistance with chronic disease management and care coordination needs.    Met with patient and her daughter/caregiver, Bethany James, today during their appointment with primary care provider. Patient feeling much better, and able to ambulate independently down the hall (previously required a wheelchair)  Patient's daughter requests assistance obtaining glucerna.   Review of patient status, including review of consultants reports, laboratory and other test data, was performed as part of comprehensive evaluation and provision of chronic care management services.   Objective:  Lab Results  Component Value Date   CREATININE 0.73 04/13/2019   CREATININE 0.82 04/09/2019   CREATININE 1.72 (H) 02/28/2019    Lab Results  Component Value Date   HGBA1C 10.4 (H) 02/21/2019       Component Value Date/Time   CHOL 113 04/09/2019 1613   TRIG 71 04/09/2019 1613   HDL 67 04/09/2019 1613   LDLCALC 32 04/09/2019 1613    Clinical ASCVD: No   BP Readings from Last 3 Encounters:  04/25/19 (!) 142/87  04/13/19 (!) 150/95  04/09/19 131/88    Medications Reviewed Today    Reviewed by De Hollingshead, [place] (Pharmacist) on 04/25/19 at 1540  Med List Status: <None>  Medication Order Taking? Sig Documenting Provider Last Dose Status Informant  DULoxetine (CYMBALTA) 30 MG capsule [id number] Yes Take one tablet (30 MG) daily by mouth for one week and then may increase to two tablets (60 MG) daily by mouth. Marnee Guarneri T, NP Taking Active   insulin aspart (NOVOLOG) 100 UNIT/ML FlexPen [id number] Yes Inject 5 Units into the skin 3 (three) times daily with meals. Marnee Guarneri T, NP Taking Active   Insulin Detemir (LEVEMIR) 100 UNIT/ML Pen [id number] Yes Inject 10  Units into the skin daily. Marnee Guarneri T, NP Taking Active   Insulin Pen Needle 31G X 4 MM MISC [id number] No 4 pens by Does not apply route 4 (four) times daily. To be used with insulin pens.  Patient not taking: Reported on 04/25/2019   Marnee Guarneri T, NP Not Taking Active   lidocaine (XYLOCAINE) 2 % solution [id number] No Use as directed 15 mLs in the mouth or throat every 2 (two) hours as needed (sore throat). Gargle and spit out  Patient not taking: Reported on 04/25/2019   Carrie Mew, MD Not Taking Active            Assessment:   Goals Addressed            This Visit's Progress     Patient Stated   . "We want her to keep feeling better" (pt-stated)       Current Barriers:  . Patient with complicated medical history complicated by functional/dexterity/cognitive concerns. Established care with Marnee Guarneri, NP earlier this month s/p hospitalizations for DKA . Currently well managed on Levemir 10 units QPM and Humalog 5 units TID with meals.  o Reports fastings and random readings 100-150s, very rare >200 since recent medication adjustments  . Recently initiated duloxetine 30 mg daily for mood/pain o Daughter notes this has had a significantly positive effect on patient's mood, independence, and functional ability.   Pharmacist Clinical Goal(s):  Marland Kitchen Over the next 90 days, patient will work with PharmD and primary care provider to address  needs related to optimized medication management  Interventions: . Comprehensive medication review performed. . Discussed pharmacokinetics of Levemir vs Novolog with daughter; as patient appears stable on regimen, will continue for now until next appointment when A1c will be due  Patient Self Care Activities:  . Currently UNABLE TO independently administer medications or check blood sugars  Initial goal documentation        Plan: - Will collaborate with RN CM Janci Minor for assistance in obtaining Glucerna, and any  support if determined OT/PT is needed again - Will follow up with patient and her daughter regarding blood sugars in 3-5 weeks.  Catie Darnelle Maffucci, PharmD Clinical Pharmacist [place] [phone]

## 2019-04-25 NOTE — Patient Instructions (Signed)
Visit Information  Goals Addressed            This Visit's Progress     Patient Stated   . "We want her to keep feeling better" (pt-stated)       Current Barriers:  . Patient with complicated medical history complicated by functional/dexterity/cognitive concerns. Established care with Marnee Guarneri, NP earlier this month s/p hospitalizations for DKA . Currently well managed on Levemir 10 units QPM and Humalog 5 units TID with meals.  o Reports fastings and random readings 100-150s, very rare >200 since recent medication adjustments  . Recently initiated duloxetine 30 mg daily for mood/pain o Daughter notes this has had a significantly positive effect on patient's mood, independence, and functional ability.   Pharmacist Clinical Goal(s):  Marland Kitchen Over the next 90 days, patient will work with PharmD and primary care provider to address needs related to optimized medication management  Interventions: . Comprehensive medication review performed. . Discussed pharmacokinetics of Levemir vs Novolog with daughter; as patient appears stable on regimen, will continue for now until next appointment when A1c will be due  Patient Self Care Activities:  . Currently UNABLE TO independently administer medications or check blood sugars  Initial goal documentation        The patient verbalized understanding of instructions provided today and declined a print copy of patient instruction materials.   Plan: - Will collaborate with RN CM Janci Minor for assistance in obtaining Glucerna, and any support if determined OT/PT is needed again - Will follow up with patient and her daughter regarding blood sugars in 3-5 weeks.  Catie Darnelle Maffucci, PharmD Clinical Pharmacist [place] [phone]

## 2019-04-25 NOTE — Assessment & Plan Note (Signed)
Acute with minimal improvement since ER visit. Rapid strep negative today.  Moderate erythema noted.  Script sent for Pen V 500 MG BID x 10 days due to no improvement and physical exam findings.  Recommend Tylenol as needed for pain and use of Chloraseptic spray.  May take Claritin as needed daily for congestion.  Return to office as needed if worsening or continued and return in August for chronic disease visit.

## 2019-04-25 NOTE — Progress Notes (Signed)
BP (!) 142/87 (BP Location: Left Arm, Patient Position: Sitting, Cuff Size: Normal)   Pulse 85   Temp 98.3 F (36.8 C) (Oral)   Wt 105 lb (47.6 kg)   SpO2 100%   BMI 21.95 kg/m    Subjective:    Patient ID: Bethany James, female    DOB: 04-30-1961, 58 y.o.   MRN: [id number]  HPI: Bethany James is a 58 y.o. female  Chief Complaint  Patient presents with  . Sore Throat   ER FOLLOW UP Seen for sore throat.  Covid testing was negative.  Do not see strep test on file.  She reports continued sore throat with swallowing and throughout day on and off.  Has been using mouth gargle at home, but no improvement.  Continues to have ongoing sore throat and reports some congestion.  Of note patient to office today without W/C, walked into office and then walked with provider to weight scale without assistance.  Much improved from previous visit.  Her daughter states since starting on Duloxetine patient has had a drastic difference in mood, pain level, and motivation.  Is checking her own sugars at home and administering own insulin.  Patient reports feeling safe in her daughter's home and has no complaints when asked privately without daughter present. Time since discharge: 04/13/2019 discharge date Hospital/facility: [facility] Diagnosis: pharyngitis Procedures/tests: Covid negative, CBC, BMP Consultants: none New medications: viscous lidocaine and menthol lozenge Discharge instructions:  Follow-up with PCP Status: fluctuating  Relevant past medical, surgical, family and social history reviewed and updated as indicated. Interim medical history since our last visit reviewed. Allergies and medications reviewed and updated.  Review of Systems  Constitutional: Negative for activity change, appetite change, fatigue and fever.  HENT: Positive for sore throat. Negative for congestion, ear discharge, ear pain, facial swelling, postnasal drip, rhinorrhea, sinus pressure, sinus pain, sneezing and voice change.    Eyes: Negative for pain and visual disturbance.  Respiratory: Negative for cough, chest tightness, shortness of breath and wheezing.   Cardiovascular: Negative for chest pain, palpitations and leg swelling.  Gastrointestinal: Negative for abdominal distention, abdominal pain, constipation, diarrhea, nausea and vomiting.  Endocrine: Negative.   Musculoskeletal: Negative for myalgias.  Neurological: Negative for dizziness, numbness and headaches.  Psychiatric/Behavioral: Negative.     Per HPI unless specifically indicated above     Objective:    BP (!) 142/87 (BP Location: Left Arm, Patient Position: Sitting, Cuff Size: Normal)   Pulse 85   Temp 98.3 F (36.8 C) (Oral)   Wt 105 lb (47.6 kg)   SpO2 100%   BMI 21.95 kg/m   Wt Readings from Last 3 Encounters:  04/25/19 105 lb (47.6 kg)  04/13/19 105 lb (47.6 kg)  04/09/19 105 lb (47.6 kg)    Physical Exam Vitals signs and nursing note reviewed.  Constitutional:      General: She is awake. She is not in acute distress.    Appearance: She is well-developed. She is not ill-appearing.     Comments: Frail African American female.  HENT:     Head: Normocephalic.     Right Ear: Hearing, tympanic membrane, ear canal and external ear normal.     Left Ear: Hearing, tympanic membrane, ear canal and external ear normal.     Nose: Nose normal. No mucosal edema or rhinorrhea.     Right Sinus: No maxillary sinus tenderness or frontal sinus tenderness.     Left Sinus: No maxillary sinus tenderness or frontal sinus tenderness.  Mouth/Throat:     Mouth: Mucous membranes are moist.     Pharynx: Posterior oropharyngeal erythema (moderate) present. No pharyngeal swelling or oropharyngeal exudate.  Eyes:     General: Lids are normal.        Right eye: No discharge.        Left eye: No discharge.     Conjunctiva/sclera: Conjunctivae normal.     Pupils: Pupils are equal, round, and reactive to light.  Neck:     Musculoskeletal: Normal  range of motion and neck supple.     Thyroid: No thyromegaly.     Vascular: No carotid bruit or JVD.  Cardiovascular:     Rate and Rhythm: Normal rate and regular rhythm.     Heart sounds: Normal heart sounds. No murmur. No gallop.   Pulmonary:     Effort: Pulmonary effort is normal.     Breath sounds: Normal breath sounds.  Abdominal:     General: Bowel sounds are normal.     Palpations: Abdomen is soft. There is no hepatomegaly or splenomegaly.  Musculoskeletal:     Right lower leg: No edema.     Left lower leg: No edema.  Lymphadenopathy:     Cervical: No cervical adenopathy.  Skin:    General: Skin is warm and dry.  Neurological:     Mental Status: She is alert and oriented to person, place, and time.  Psychiatric:        Attention and Perception: Attention normal.        Mood and Affect: Mood normal.        Speech: Speech normal.        Behavior: Behavior normal. Behavior is cooperative.        Thought Content: Thought content normal.        Judgment: Judgment normal.     Results for orders placed or performed during the hospital encounter of 04/13/19  Novel Coronavirus,NAA,(SEND-OUT TO REF LAB - TAT 24-48 hrs); Hosp Order   Specimen: Nasopharyngeal Swab; Respiratory  Result Value Ref Range   SARS-CoV-2, NAA NOT DETECTED NOT DETECTED   Coronavirus Source NASOPHARYNGEAL   Basic metabolic panel  Result Value Ref Range   Sodium 136 135 - 145 mmol/L   Potassium 4.0 3.5 - 5.1 mmol/L   Chloride 101 98 - 111 mmol/L   CO2 26 22 - 32 mmol/L   Glucose, Bld 172 (H) 70 - 99 mg/dL   BUN 14 6 - 20 mg/dL   Creatinine, Ser 0.270.73 0.44 - 1.00 mg/dL   Calcium 9.8 8.9 - 25.310.3 mg/dL   GFR calc non Af Amer >60 >60 mL/min   GFR calc Af Amer >60 >60 mL/min   Anion gap 9 5 - 15  CBC with Differential  Result Value Ref Range   WBC 7.2 4.0 - 10.5 K/uL   RBC 4.47 3.87 - 5.11 MIL/uL   Hemoglobin 13.7 12.0 - 15.0 g/dL   HCT 66.439.7 40.336.0 - 47.446.0 %   MCV 88.8 80.0 - 100.0 fL   MCH 30.6 26.0 -  34.0 pg   MCHC 34.5 30.0 - 36.0 g/dL   RDW 25.912.0 56.311.5 - 87.515.5 %   Platelets 333 150 - 400 K/uL   nRBC 0.0 0.0 - 0.2 %   Neutrophils Relative % 62 %   Neutro Abs 4.5 1.7 - 7.7 K/uL   Lymphocytes Relative 29 %   Lymphs Abs 2.1 0.7 - 4.0 K/uL   Monocytes Relative 7 %   Monocytes Absolute  0.5 0.1 - 1.0 K/uL   Eosinophils Relative 1 %   Eosinophils Absolute 0.1 0.0 - 0.5 K/uL   Basophils Relative 1 %   Basophils Absolute 0.0 0.0 - 0.1 K/uL   Immature Granulocytes 0 %   Abs Immature Granulocytes 0.02 0.00 - 0.07 K/uL      Assessment & Plan:   Problem List Items Addressed This Visit      Respiratory   Pharyngitis - Primary    Acute with minimal improvement since ER visit. Rapid strep negative today.  Moderate erythema noted.  Script sent for Pen V 500 MG BID x 10 days due to no improvement and physical exam findings.  Recommend Tylenol as needed for pain and use of Chloraseptic spray.  May take Claritin as needed daily for congestion.  Return to office as needed if worsening or continued and return in August for chronic disease visit.      Relevant Orders   Rapid Strep Screen ([facility] ONLY)     Endocrine   Type 2 diabetes mellitus with hyperglycemia (HCC)   Relevant Orders   Ambulatory referral to Ophthalmology     Other   Depression, major, single episode, moderate (HCC)   Relevant Orders   Ambulatory referral to Psychology       Follow up plan: Return in about 6 weeks (around 06/06/2019) for T2DM, HTN/HLD.

## 2019-04-25 NOTE — Patient Instructions (Signed)
Sore Throat  When you have a sore throat, your throat may feel:  · Tender.  · Burning.  · Irritated.  · Scratchy.  · Painful when you swallow.  · Painful when you talk.  Many things can cause a sore throat, such as:  · An infection.  · Allergies.  · Dry air.  · Smoke or pollution.  · Radiation treatment.  · Gastroesophageal reflux disease (GERD).  · A tumor.  A sore throat can be the first sign of another sickness. It can happen with other problems, like:  · Coughing.  · Sneezing.  · Fever.  · Swelling in the neck.  Most sore throats go away without treatment.  Follow these instructions at home:         · Take over-the-counter medicines only as told by your doctor.  ? If your child has a sore throat, do not give your child aspirin.  · Drink enough fluids to keep your pee (urine) pale yellow.  · Rest when you feel you need to.  · To help with pain:  ? Sip warm liquids, such as broth, herbal tea, or warm water.  ? Eat or drink cold or frozen liquids, such as frozen ice pops.  ? Gargle with a salt-water mixture 3-4 times a day or as needed. To make a salt-water mixture, add ½-1 tsp (3-6 g) of salt to 1 cup (237 mL) of warm water. Mix it until you cannot see the salt anymore.  ? Suck on hard candy or throat lozenges.  ? Put a cool-mist humidifier in your bedroom at night.  ? Sit in the bathroom with the door closed for 5-10 minutes while you run hot water in the shower.  · Do not use any products that contain nicotine or tobacco, such as cigarettes, e-cigarettes, and chewing tobacco. If you need help quitting, ask your doctor.  · Wash your hands well and often with soap and water. If soap and water are not available, use hand sanitizer.  Contact a doctor if:  · You have a fever for more than 2-3 days.  · You keep having symptoms for more than 2-3 days.  · Your throat does not get better in 7 days.  · You have a fever and your symptoms suddenly get worse.  · Your child who is 3 months to 3 years old has a temperature of  102.2°F (39°C) or higher.  Get help right away if:  · You have trouble breathing.  · You cannot swallow fluids, soft foods, or your saliva.  · You have swelling in your throat or neck that gets worse.  · You keep feeling sick to your stomach (nauseous).  · You keep throwing up (vomiting).  Summary  · A sore throat is pain, burning, irritation, or scratchiness in the throat. Many things can cause a sore throat.  · Take over-the-counter medicines only as told by your doctor. Do not give your child aspirin.  · Drink plenty of fluids, and rest as needed.  · Contact a doctor if your symptoms get worse or your sore throat does not get better within 7 days.  This information is not intended to replace advice given to you by your health care provider. Make sure you discuss any questions you have with your health care provider.  Document Released: 07/27/2008 Document Revised: 03/20/2018 Document Reviewed: 03/20/2018  Elsevier Interactive Patient Education © 2019 Elsevier Inc.

## 2019-04-27 ENCOUNTER — Telehealth: Payer: Medicaid Other

## 2019-04-29 LAB — CULTURE, GROUP A STREP: Strep A Culture: NEGATIVE

## 2019-04-29 LAB — RAPID STREP SCREEN (MED CTR MEBANE ONLY): Strep Gp A Ag, IA W/Reflex: NEGATIVE

## 2019-05-02 ENCOUNTER — Telehealth: Payer: Self-pay

## 2019-05-02 ENCOUNTER — Ambulatory Visit: Payer: Medicaid Other | Admitting: *Deleted

## 2019-05-02 DIAGNOSIS — F321 Major depressive disorder, single episode, moderate: Secondary | ICD-10-CM

## 2019-05-02 DIAGNOSIS — Z794 Long term (current) use of insulin: Secondary | ICD-10-CM

## 2019-05-02 DIAGNOSIS — E1165 Type 2 diabetes mellitus with hyperglycemia: Secondary | ICD-10-CM

## 2019-05-02 NOTE — Chronic Care Management (AMB) (Signed)
  Chronic Care Management   Follow Up Note   05/02/2019 Name: Bethany James MRN: [id number] DOB: Jul 05, 1961  Referred by: Venita Lick, NP Reason for referral : Chronic Care Management (Diabetic/weight loss)   Bethany James is a 58 y.o. year old female who is a primary care patient of Cannady, Barbaraann Faster, NP. The CCM team was consulted for assistance with chronic disease management and care coordination needs.    Review of patient status, including review of consultants reports, relevant laboratory and other test results, and collaboration with appropriate care team members and the patient's provider was performed as part of comprehensive patient evaluation and provision of chronic care management services.    Spoke with daughter Bethany James on the phone today. She was encouraged about her mother's mood and physical strength improving. She states patient has been unable to gain weight, she denies poor appetite.   Goals Addressed            This Visit's Progress   . Mom has lost weight (pt-stated)       Current Barriers:  Marland Kitchen Knowledge Deficits related to weight loss prevention . Cognitive Deficits  Nurse Case Manager Clinical Goal(s):  Marland Kitchen Over the next 90 days, patient will work with [facility] to address needs related to recent weight loss with out loss of appetite  Interventions:  . Collaborated with patient's daughter/caregiver regarding patient's recent weight loss.  . Discussed plans with patient for ongoing care management follow up and provided patient with direct contact information for care management team . Provided patient with written  educational materials related to how to add calories to food to increase weight.  . Advised daughter to come to the PCP office to obtain Glucerna samples and coupons.   Patient Self Care Activities:  . Currently UNABLE TO independently cook her own meals  Initial goal documentation         The care management team will reach out to the patient  again over the next 14 days.  The patient has been provided with contact information for the care management team and has been advised to call with any health related questions or concerns.    Merlene Morse Iysha Mishkin RN, BSN Nurse Case Editor, [facility]  [phone]) Business Mobile

## 2019-05-02 NOTE — Patient Instructions (Signed)
Thank you allowing the Chronic Care Management Team to be a part of your care! It was a pleasure speaking with you today!  1. Come to PCP office to pick up Glucerna samples and coupons.   CCM (Chronic Care Management) Team   Adeana Grilliot RN, BSN Nurse Care Coordinator  [phone]  [facility] PharmD  Clinical Pharmacist  [phone]  Dickie LaBrooke Joyce LCSW Clinical Social Worker [phone]  Goals Addressed            This Visit's Progress   . Mom has lost weight (pt-stated)       Current Barriers:  Bethany James. Knowledge Deficits related to weight loss prevention . Cognitive Deficits  Nurse Case Manager Clinical Goal(s):  Bethany James. Over the next 90 days, [facility] needs related to recent weight loss with out loss of appetite  Interventions:  . Collaborated with patient's daughter/caregiver regarding patient's recent weight loss.  . Discussed plans with patient for ongoing care management follow up and provided patient with direct contact information for care management team . Provided patient with written  educational materials related to how to add calories to food to increase weight.  . Advised daughter to come to the PCP office to obtain Glucerna samples and coupons.   Patient Self Care Activities:  . Currently UNABLE TO independently cook her own meals  Initial goal documentation        Print copy of patient instructions provided.   The patient has been provided with contact information for the care management team and has been advised to call with any health related questions or concerns.     High-Protein and High-Calorie Diet Eating high-protein and high-calorie foods can help you to gain weight, heal after an injury, and recover after an illness or surgery. The specific amount of daily protein and calories you need depends on:  Your body weight.  The reason this diet is recommended for you. What is my plan? Generally, a high-protein,  high-calorie diet involves:  Eating 250-500 extra calories each day.  Making sure that you get enough of your daily calories from protein. Ask your health care provider how many of your calories should come from protein. Talk with a health care provider, such as a diet and nutrition specialist (dietitian), about how much protein and how many calories you need each day. Follow the diet as directed by your health care provider. What are tips for following this plan?  Preparing meals  Add whole milk, half-and-half, or heavy cream to cereal, pudding, soup, or hot cocoa.  Add whole milk to instant breakfast drinks.  Add peanut butter to oatmeal or smoothies.  Add powdered milk to baked goods, smoothies, or milkshakes.  Add powdered milk, cream, or butter to mashed potatoes.  Add cheese to cooked vegetables.  Make whole-milk yogurt parfaits. Top them with granola, fruit, or nuts.  Add cottage cheese to your fruit.  Add avocado, cheese, or both to sandwiches or salads.  Add meat, poultry, or seafood to rice, pasta, casseroles, salads, and soups.  Use mayonnaise when making egg salad, chicken salad, or tuna salad.  Use peanut butter as a dip for vegetables or as a topping for pretzels, celery, or crackers.  Add beans to casseroles, dips, and spreads.  Add pureed beans to sauces and soups.  Replace calorie-free drinks with calorie-containing drinks, such as milk and fruit juice.  Replace water with milk or heavy cream when making foods such as oatmeal, pudding,  or cocoa. General instructions  Ask your health care provider if you should take a nutritional supplement.  Try to eat six small meals each day instead of three large meals.  Eat a balanced diet. In each meal, include one food that is high in protein.  Keep nutritious snacks available, such as nuts, trail mixes, dried fruit, and yogurt.  If you have kidney disease or diabetes, talk with your health care provider about  how much protein is safe for you. Too much protein may put extra stress on your kidneys.  Drink your calories. Choose high-calorie drinks and have them after your meals. What high-protein foods should I eat?  Vegetables Soybeans. Peas. Grains Quinoa. Bulgur wheat. Meats and other proteins Beef, pork, and poultry. Fish and seafood. Eggs. Tofu. Textured vegetable protein (TVP). Peanut butter. Nuts and seeds. Dried beans. Protein powders. Dairy Whole milk. Whole-milk yogurt. Powdered milk. Cheese. [employer]. Eggnog. Beverages High-protein supplement drinks. Soy milk. Other foods Protein bars. The items listed above may not be a complete list of high-protein foods and beverages. Contact a dietitian for more options. What high-calorie foods should I eat? Fruits Dried fruit. Fruit leather. Canned fruit in syrup. Fruit juice. Avocado. Vegetables Vegetables cooked in oil or butter. Fried potatoes. Grains Pasta. Quick breads. Muffins. Pancakes. Ready-to-eat cereal. Meats and other proteins Peanut butter. Nuts and seeds. Dairy Heavy cream. Whipped cream. Cream cheese. Sour cream. Ice cream. Custard. Pudding. Beverages Meal-replacement beverages. Nutrition shakes. Fruit juice. Sugar-sweetened soft drinks. Seasonings and condiments Salad dressing. Mayonnaise. Alfredo sauce. Fruit preserves or jelly. Honey. Syrup. Sweets and desserts Cake. Cookies. Pie. Pastries. Candy bars. Chocolate. Fats and oils Butter or margarine. Oil. Gravy. Other foods Meal-replacement bars. The items listed above may not be a complete list of high-calorie foods and beverages. Contact a dietitian for more options. Summary  A high-protein, high-calorie diet can help you gain weight or heal faster after an injury, illness, or surgery.  To increase your protein and calories, add ingredients such as whole milk, peanut butter, cheese, beans, meat, or seafood to meal items.  To get enough extra calories each  day, include high-calorie foods and beverages at each meal.  Adding a high-calorie drink or shake can be an easy way to help you get enough calories each day. Talk with your healthcare provider or dietitian about the best options for you. This information is not intended to replace advice given to you by your health care provider. Make sure you discuss any questions you have with your health care provider. Document Released: 10/18/2005 Document Revised: 09/30/2017 Document Reviewed: 08/30/2017 Elsevier Patient Education  2020 [employer].

## 2019-05-15 ENCOUNTER — Other Ambulatory Visit: Payer: Self-pay | Admitting: Neurology

## 2019-05-15 ENCOUNTER — Other Ambulatory Visit (HOSPITAL_COMMUNITY): Payer: Self-pay | Admitting: Neurology

## 2019-05-15 DIAGNOSIS — R413 Other amnesia: Secondary | ICD-10-CM

## 2019-05-18 ENCOUNTER — Telehealth: Payer: Self-pay | Admitting: Nurse Practitioner

## 2019-05-18 ENCOUNTER — Ambulatory Visit: Payer: Self-pay | Admitting: Licensed Clinical Social Worker

## 2019-05-18 ENCOUNTER — Encounter: Payer: Self-pay | Admitting: Nurse Practitioner

## 2019-05-18 ENCOUNTER — Telehealth: Payer: Medicaid Other

## 2019-05-18 ENCOUNTER — Telehealth: Payer: Self-pay

## 2019-05-18 DIAGNOSIS — R4189 Other symptoms and signs involving cognitive functions and awareness: Secondary | ICD-10-CM | POA: Insufficient documentation

## 2019-05-18 NOTE — Chronic Care Management (AMB) (Signed)
  Care Management   Follow Up Note   05/18/2019 Name: Bethany James MRN: [id number] DOB: 29-Jun-1961  Referred by: Venita Lick, NP Reason for referral : Care Coordination   Bethany James is a 58 y.o. year old female who is a primary care patient of Cannady, Barbaraann Faster, NP. The care management team was consulted for assistance with care management and care coordination needs.    Review of patient status, including review of consultants reports, relevant laboratory and other test results, and collaboration with appropriate care team members and the patient's provider was performed as part of comprehensive patient evaluation and provision of chronic care management services.    LCSW completed CCM outreach attempt today but was unable to reach patient successfully. A HIPPA compliant voice message was left encouraging patient to return call once available. LCSW rescheduled CCM SW appointment as well.  The care management team will reach out to the patient again over the next 30 days.   Eula Fried, BSW, MSW, [facility] [email or web address] Phone: [phone]

## 2019-05-18 NOTE — Telephone Encounter (Signed)
Adding Brook and [place] on this so they can read initial call note.  I spoke to her daughter.  She is "in better place" now after talking to her husband.  Was feeling very overwhelmed with her mom's behaviors this week and finding out from her mom that she has done this a couple other times.  They did see Dr. Manuella Ghazi on 13th and are scheduled for MRI next week.  I read his note and it does mention some suspected cognitive deficit, possibly related to some hypoxic brain during recent acute hospital time and also past fetal alcohol syndrome.  Definitely read his note, some extra info there.  I discussed at length dementias and different types, some that present with impulsive behaviors or hypersexual behaviors.  She reports her mom has been "caught at our doorway in middle of night watching [place] have sex".  Dr. Manuella Ghazi did place her on Aricept.  They do not want to pursue SNF at this time, but wish to see what upcoming imaging shows and then decide from there.  If concern for dementia and ongoing behaviors they would like to discuss options.  Will wait to see imaging and neuro follow-up.

## 2019-05-18 NOTE — Telephone Encounter (Signed)
Daughter is very worried about mother, pt was seen by neurology this week and it may be the pt being spiteful according to the daughter.  Pt wears a depend at night, so she doesn't have to walk the dark nighttime halls. If she gets wet, she is mobile enough and knows how to change her depend.   Last night the pt got up and peed into a water bottle and placed it in her bedroom trash can. Daughter says that she caught this act, but wonders what else the pt has done that has not been caught.  Daughter is worried that since she has small children and a toddler that they will be in danger.  Daughter is wanting to discuss placement in a snf or other facility.  Daughter is wanting this placement done as soon as possible.  Please call at earliest convenience.  [phone]

## 2019-05-23 ENCOUNTER — Ambulatory Visit (HOSPITAL_COMMUNITY): Admission: RE | Admit: 2019-05-23 | Payer: Medicaid Other | Source: Ambulatory Visit

## 2019-05-23 ENCOUNTER — Encounter (HOSPITAL_COMMUNITY): Payer: Self-pay

## 2019-05-30 ENCOUNTER — Ambulatory Visit: Payer: Medicaid Other | Admitting: Pharmacist

## 2019-05-30 ENCOUNTER — Ambulatory Visit (INDEPENDENT_AMBULATORY_CARE_PROVIDER_SITE_OTHER): Payer: Medicaid Other | Admitting: Nurse Practitioner

## 2019-05-30 ENCOUNTER — Other Ambulatory Visit: Payer: Self-pay

## 2019-05-30 ENCOUNTER — Encounter: Payer: Self-pay | Admitting: Nurse Practitioner

## 2019-05-30 VITALS — BP 116/78 | HR 85 | Temp 98.5°F

## 2019-05-30 DIAGNOSIS — E1169 Type 2 diabetes mellitus with other specified complication: Secondary | ICD-10-CM | POA: Diagnosis not present

## 2019-05-30 DIAGNOSIS — R7989 Other specified abnormal findings of blood chemistry: Secondary | ICD-10-CM

## 2019-05-30 DIAGNOSIS — I1310 Hypertensive heart and chronic kidney disease without heart failure, with stage 1 through stage 4 chronic kidney disease, or unspecified chronic kidney disease: Secondary | ICD-10-CM

## 2019-05-30 DIAGNOSIS — E1165 Type 2 diabetes mellitus with hyperglycemia: Secondary | ICD-10-CM

## 2019-05-30 DIAGNOSIS — R4189 Other symptoms and signs involving cognitive functions and awareness: Secondary | ICD-10-CM

## 2019-05-30 DIAGNOSIS — N183 Chronic kidney disease, stage 3 unspecified: Secondary | ICD-10-CM

## 2019-05-30 DIAGNOSIS — Z794 Long term (current) use of insulin: Secondary | ICD-10-CM

## 2019-05-30 DIAGNOSIS — E785 Hyperlipidemia, unspecified: Secondary | ICD-10-CM

## 2019-05-30 NOTE — Assessment & Plan Note (Signed)
Recheck thyroid panel 

## 2019-05-30 NOTE — Progress Notes (Addendum)
BP 116/78   Pulse 85   Temp 98.5 F (36.9 C) (Oral)   SpO2 100%    Subjective:    Patient ID: Bethany James, female    DOB: 03-26-1961, 58 y.o.   MRN: [id number]  HPI: Bethany James is a 58 y.o. female  Chief Complaint  Patient presents with  . Hypertension  . Hyperlipidemia  . Diabetes   DIABETES Last A1C 10.4% on 02/21/2019.  Continues on Detemir 10 units daily and Novolog 5 units TID. Diagnosed 2 years ago, when she fell at her home and had blood sugar of over 1000.  Was in ICU for two weeks.  Has only been on insulin, was initially placed on this with past hospitalization in 2018.   Hypoglycemic episodes:no Polydipsia/polyuria: no Visual disturbance: no Chest pain: no Paresthesias: no Glucose Monitoring: yes  Accucheck frequency: Daily  Fasting glucose: 130's recently in morning, one day where it was 268  Post prandial:  Evening:  Before meals: Taking Insulin?: yes  Long acting insulin: Detemir 10 units  Short acting insulin: Novolog 5 units  Blood Pressure Monitoring: not checking Retinal Examination: Up to Date Foot Exam: Up to Date Pneumovax: Not up to Date Influenza: Up to Date Aspirin: no  HYPERTENSION / HYPERLIPIDEMIA No current BP or statin medication.  Well-controlled cholesterol levels with recent LDL 32 and TCHOL 113 on 04/09/2019.   Duration of hypertension: chronic BP monitoring frequency: not checking BP range: not checking BP medication side effects: no Duration of hyperlipidemia: chronic Aspirin: no Recent stressors: no Recurrent headaches: no Visual changes: no Palpitations: no Dyspnea: no Chest pain: no Lower extremity edema: no Dizzy/lightheaded: no   COGNITIVE IMPAIRMENT: Being [facility].  Initial visit 05/14/2019 and is scheduled to have MRI performed, not sure of date yet as waiting for approval.  She is reported to be having some behaviors, such as urinating in water bottles and leaving feces  covered toilet paper on toilet.  Has been stating she can not swallow saliva at times, but then will swallow on instruction. Has history of mother who heavily drank when pregnant with her and patient was told fetal alcohol issues present.  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  Currently on Donepezil for memory care.  No recent falls and continues to eat meals well.  Her daughter is primary caregiver and reported recently feeling some burn out and is pondering placement for patient, wishes to wait until MRI results returned.    ELEVATED TSH LEVEL: June 2020 TSH 6.060 and T4 7.5.  No current medications. Fatigue: no Cold intolerance: no Heat intolerance: no Weight gain: no Weight loss: no Constipation: no Diarrhea/loose stools: no Palpitations: no Lower extremity edema: no Anxiety/depressed mood: no  6CIT Screen 05/30/2019  What Year? 4 points  What month? 3 points  What time? 0 points  Count back from 20 0 points  Months in reverse 0 points  Repeat phrase 6 points  Total Score 13    Relevant past medical, surgical, family and social history reviewed and updated as indicated. Interim medical history since our last visit reviewed. Allergies and medications reviewed and updated.  Review of Systems  Constitutional: Negative for activity change, appetite change, diaphoresis, fatigue and fever.  Respiratory: Negative for cough, chest tightness and shortness of breath.   Cardiovascular: Negative for chest pain, palpitations and leg swelling.  Gastrointestinal: Negative for abdominal distention, abdominal pain,  constipation, diarrhea, nausea and vomiting.  Endocrine: Negative for cold intolerance, heat intolerance, polydipsia, polyphagia and polyuria.  Neurological: Negative for dizziness, syncope, weakness, light-headedness, numbness and headaches.  Psychiatric/Behavioral: Negative.     Per HPI unless specifically indicated above      Objective:    BP 116/78   Pulse 85   Temp 98.5 F (36.9 C) (Oral)   SpO2 100%   Wt Readings from Last 3 Encounters:  04/25/19 105 lb (47.6 kg)  04/13/19 105 lb (47.6 kg)  04/09/19 105 lb (47.6 kg)    Physical Exam Vitals signs and nursing note reviewed.  Constitutional:      General: She is awake. She is not in acute distress.    Appearance: She is well-developed. She is not ill-appearing.  HENT:     Head: Normocephalic.     Right Ear: Hearing normal.     Left Ear: Hearing normal.     Nose: Nose normal.     Mouth/Throat:     Mouth: Mucous membranes are moist.  Eyes:     General: Lids are normal.        Right eye: No discharge.        Left eye: No discharge.     Conjunctiva/sclera: Conjunctivae normal.     Pupils: Pupils are equal, round, and reactive to light.  Neck:     Musculoskeletal: Normal range of motion and neck supple.     Thyroid: No thyromegaly.     Vascular: No carotid bruit.  Cardiovascular:     Rate and Rhythm: Normal rate and regular rhythm.     Heart sounds: Normal heart sounds. No murmur. No gallop.   Pulmonary:     Effort: Pulmonary effort is normal. No accessory muscle usage or respiratory distress.     Breath sounds: Normal breath sounds.  Abdominal:     General: Bowel sounds are normal.     Palpations: Abdomen is soft.     Tenderness: There is no abdominal tenderness.  Musculoskeletal:     Right lower leg: No edema.     Left lower leg: No edema.  Lymphadenopathy:     Cervical: No cervical adenopathy.  Skin:    General: Skin is warm and dry.  Neurological:     Mental Status: She is alert.  Psychiatric:        Attention and Perception: Attention normal.        Mood and Affect: Affect is flat.        Speech: Speech normal.        Behavior: Behavior normal. Behavior is cooperative.        Thought Content: Thought content normal.        Judgment: Judgment normal.     Comments: Unable to state year, month, day.  Was able to say who  president of the [place] is.      Diabetic Foot Exam - Simple   Simple Foot Form Visual Inspection No deformities, no ulcerations, no other skin breakdown bilaterally: Yes Sensation Testing Intact to touch and monofilament testing bilaterally: Yes Pulse Check Posterior Tibialis and Dorsalis pulse intact bilaterally: Yes Comments     Results for orders placed or performed in visit on 04/25/19  Rapid Strep Screen ([facility] ONLY)   Specimen: Other   OTHER  Result Value Ref Range   Strep Gp A Ag, IA W/Reflex Negative Negative  Culture, Group A Strep   OTHER  Result Value Ref Range   Strep A Culture  Negative       Assessment & Plan:   Problem List Items Addressed This Visit      Cardiovascular and Mediastinum   Hypertensive heart/kidney disease without HF and with CKD stage III (HCC)    No current BP medications with BP well below goal.  May consider very low dose ACE or ARB for kidney protection, will further discuss with daughter and patient at next visit.  CMP today.        Endocrine   Type 2 diabetes mellitus with hyperglycemia (HCC) - Primary    Chronic, ongoing with improvement in A1C at 7.6%.  Continue current medication regimen and praised patient and daughter for major improvement, as previous A1C 10.4%.  Return in 3 months, obtain urine next visit.       Relevant Orders   Bayer DCA Hb A1c Waived   Comprehensive metabolic panel   Hyperlipidemia associated with type 2 diabetes mellitus ([place])    No current statin and recent lipid panel well below goal.  Recheck today.      Relevant Orders   Comprehensive metabolic panel     Other   Elevated TSH    Recheck thyroid panel.      Relevant Orders   Thyroid Panel With TSH   Cognitive impairment    Continue memory care medication and collaboration with Dr. Manuella Ghazi.  Assist daughter if she decides on placement for patient.  Monitor behaviors and adjust medication regimen as needed.  May require mood stabilizer:  Depakote consideration.         Time: 25 minutes, >50% spent counseling/or care coordination with patient and daughter   Follow up plan: Return in about 3 months (around 08/30/2019) for T2DM, HTN/HLD.

## 2019-05-30 NOTE — Patient Instructions (Signed)
Visit Information  Goals Addressed            This Visit's Progress     Patient Stated   . "We want her to keep feeling better" (pt-stated)       Current Barriers:  . Patient with complicated medical history complicated by functional/dexterity/cognitive concerns. Established care with Marnee Guarneri, NP earlier this month s/p hospitalizations for DKA. Has established with Dr. Manuella Ghazi neurology, plans to get MRI but having some insurance hiccups lately . Today, daughter notes increased worries with cognitive concerns, mood concerns.  . T2DM, currently well managed and much improved from >10% to 7.6%, o Managed on Levemir 10 units QPM and Humalog 5 units TID with meals. Daughter notes that patient injects her own insulin and checks her own blood sugars, daughter prepares pen needles and checks behind her on the readings o Checking fasting, have been good ~130s recently, with just one reading >250; no readings <70   Pharmacist Clinical Goal(s):  Marland Kitchen Over the next 90 days, patient will work with PharmD and primary care provider to address needs related to optimized medication management  Interventions: . Congratulated patient and her daughter on improvement in A1c. Encouraged continued monitoring of BG and to alert [place] to any hypoglycemia concerns.   Patient Self Care Activities:  . Currently UNABLE TO independently administer medications or check blood sugars  Please see past updates related to this goal by clicking on the "Past Updates" button in the selected goal         The patient verbalized understanding of instructions provided today and declined a print copy of patient instruction materials.   Plan:  - CCM team will continue to support patient and her daughter with chronic disease state management.   Catie Darnelle Maffucci, PharmD Clinical Pharmacist [place] [phone]

## 2019-05-30 NOTE — Assessment & Plan Note (Signed)
Chronic, ongoing with improvement in A1C at 7.6%.  Continue current medication regimen and praised patient and daughter for major improvement, as previous A1C 10.4%.  Return in 3 months, obtain urine next visit.

## 2019-05-30 NOTE — Chronic Care Management (AMB) (Signed)
  Chronic Care Management   Follow Up Note   05/30/2019 Name: Bethany James MRN: [id number] DOB: 1961/09/18  Referred by: Venita Lick, NP Reason for referral : Chronic Care Management (Medication Management)   Bethany James is a 58 y.o. year old female who is a primary care patient of Cannady, Barbaraann Faster, NP. The CCM team was consulted for assistance with chronic disease management and care coordination needs.    Met with patient and her daughter face to face during appointment with Marnee Guarneri, NP.  Review of patient status, including review of consultants reports, relevant laboratory and other test results, and collaboration with appropriate care team members and the patient's provider was performed as part of comprehensive patient evaluation and provision of chronic care management services.    Goals Addressed            This Visit's Progress     Patient Stated   . "We want her to keep feeling better" (pt-stated)       Current Barriers:  . Patient with complicated medical history complicated by functional/dexterity/cognitive concerns. Established care with Marnee Guarneri, NP earlier this month s/p hospitalizations for DKA. Has established with Dr. Manuella Ghazi neurology, plans to get MRI but having some insurance hiccups lately . Today, daughter notes increased worries with cognitive concerns, mood concerns.  . T2DM, currently well managed and much improved from >10% to 7.6%, o Managed on Levemir 10 units QPM and Humalog 5 units TID with meals. Daughter notes that patient injects her own insulin and checks her own blood sugars, daughter prepares pen needles and checks behind her on the readings o Checking fasting, have been good ~130s recently, with just one reading >250; no readings <70   Pharmacist Clinical Goal(s):  Marland Kitchen Over the next 90 days, patient will work with PharmD and primary care provider to address needs related to optimized medication management  Interventions: .  Congratulated patient and her daughter on improvement in A1c. Encouraged continued monitoring of BG and to alert [place] to any hypoglycemia concerns.   Patient Self Care Activities:  . Currently UNABLE TO independently administer medications or check blood sugars  Please see past updates related to this goal by clicking on the "Past Updates" button in the selected goal          Plan:  - CCM team will continue to support patient and her daughter with chronic disease state management.   Catie Darnelle Maffucci, PharmD Clinical Pharmacist [place] [phone]

## 2019-05-30 NOTE — Patient Instructions (Signed)
Carbohydrate Counting for Diabetes Mellitus, Adult  Carbohydrate counting is a method of keeping track of how many carbohydrates you eat. Eating carbohydrates naturally increases the amount of sugar (glucose) in the blood. Counting how many carbohydrates you eat helps keep your blood glucose within normal limits, which helps you manage your diabetes (diabetes mellitus). It is important to know how many carbohydrates you can safely have in each meal. This is different for every person. A diet and nutrition specialist (registered dietitian) can help you make a meal plan and calculate how many carbohydrates you should have at each meal and snack. Carbohydrates are found in the following foods:  Grains, such as breads and cereals.  Dried beans and soy products.  Starchy vegetables, such as potatoes, peas, and corn.  Fruit and fruit juices.  Milk and yogurt.  Sweets and snack foods, such as cake, cookies, candy, chips, and soft drinks. How do I count carbohydrates? There are two ways to count carbohydrates in food. You can use either of the methods or a combination of both. Reading "Nutrition Facts" on packaged food The "Nutrition Facts" list is included on the labels of almost all packaged foods and beverages in the [place] It includes:  The serving size.  Information about nutrients in each serving, including the grams (g) of carbohydrate per serving. To use the "Nutrition Facts":  Decide how many servings you will have.  Multiply the number of servings by the number of carbohydrates per serving.  The resulting number is the total amount of carbohydrates that you will be having. Learning standard serving sizes of other foods When you eat carbohydrate foods that are not packaged or do not include "Nutrition Facts" on the label, you need to measure the servings in order to count the amount of carbohydrates:  Measure the foods that you will eat with a food scale or measuring cup, if needed.   Decide how many standard-size servings you will eat.  Multiply the number of servings by 15. Most carbohydrate-rich foods have about 15 g of carbohydrates per serving. ? For example, if you eat 8 oz (170 g) of strawberries, you will have eaten 2 servings and 30 g of carbohydrates (2 servings x 15 g = 30 g).  For foods that have more than one food mixed, such as soups and casseroles, you must count the carbohydrates in each food that is included. The following list contains standard serving sizes of common carbohydrate-rich foods. Each of these servings has about 15 g of carbohydrates:   hamburger bun or  English muffin.   oz (15 mL) syrup.   oz (14 g) jelly.  1 slice of bread.  1 six-inch tortilla.  3 oz (85 g) cooked rice or pasta.  4 oz (113 g) cooked dried beans.  4 oz (113 g) starchy vegetable, such as peas, corn, or potatoes.  4 oz (113 g) hot cereal.  4 oz (113 g) mashed potatoes or  of a large baked potato.  4 oz (113 g) canned or frozen fruit.  4 oz (120 mL) fruit juice.  4-6 crackers.  6 chicken nuggets.  6 oz (170 g) unsweetened dry cereal.  6 oz (170 g) plain fat-free yogurt or yogurt sweetened with artificial sweeteners.  8 oz (240 mL) milk.  8 oz (170 g) fresh fruit or one small piece of fruit.  24 oz (680 g) popped popcorn. Example of carbohydrate counting Sample meal  3 oz (85 g) chicken breast.  6 oz (170 g)   brown rice.  4 oz (113 g) corn.  8 oz (240 mL) milk.  8 oz (170 g) strawberries with sugar-free whipped topping. Carbohydrate calculation 1. Identify the foods that contain carbohydrates: ? Rice. ? Corn. ? Milk. ? Strawberries. 2. Calculate how many servings you have of each food: ? 2 servings rice. ? 1 serving corn. ? 1 serving milk. ? 1 serving strawberries. 3. Multiply each number of servings by 15 g: ? 2 servings rice x 15 g = 30 g. ? 1 serving corn x 15 g = 15 g. ? 1 serving milk x 15 g = 15 g. ? 1 serving  strawberries x 15 g = 15 g. 4. Add together all of the amounts to find the total grams of carbohydrates eaten: ? 30 g + 15 g + 15 g + 15 g = 75 g of carbohydrates total. Summary  Carbohydrate counting is a method of keeping track of how many carbohydrates you eat.  Eating carbohydrates naturally increases the amount of sugar (glucose) in the blood.  Counting how many carbohydrates you eat helps keep your blood glucose within normal limits, which helps you manage your diabetes.  A diet and nutrition specialist (registered dietitian) can help you make a meal plan and calculate how many carbohydrates you should have at each meal and snack. This information is not intended to replace advice given to you by your health care provider. Make sure you discuss any questions you have with your health care provider. Document Released: 10/18/2005 Document Revised: 05/12/2017 Document Reviewed: 03/31/2016 Elsevier Patient Education  2020 [employer].  

## 2019-05-30 NOTE — Assessment & Plan Note (Signed)
Continue memory care medication and collaboration with Dr. Manuella Ghazi.  Assist daughter if she decides on placement for patient.  Monitor behaviors and adjust medication regimen as needed.  May require mood stabilizer: Depakote consideration.

## 2019-05-30 NOTE — Assessment & Plan Note (Signed)
No current BP medications with BP well below goal.  May consider very low dose ACE or ARB for kidney protection, will further discuss with daughter and patient at next visit.  CMP today.

## 2019-05-30 NOTE — Assessment & Plan Note (Signed)
No current statin and recent lipid panel well below goal.  Recheck today.

## 2019-05-31 LAB — COMPREHENSIVE METABOLIC PANEL
ALT: 10 IU/L (ref 0–32)
AST: 21 IU/L (ref 0–40)
Albumin/Globulin Ratio: 1.3 (ref 1.2–2.2)
Albumin: 4 g/dL (ref 3.8–4.9)
Alkaline Phosphatase: 64 IU/L (ref 39–117)
BUN/Creatinine Ratio: 14 (ref 9–23)
BUN: 14 mg/dL (ref 6–24)
Bilirubin Total: 0.5 mg/dL (ref 0.0–1.2)
CO2: 24 mmol/L (ref 20–29)
Calcium: 9.6 mg/dL (ref 8.7–10.2)
Chloride: 97 mmol/L (ref 96–106)
Creatinine, Ser: 1.02 mg/dL — ABNORMAL HIGH (ref 0.57–1.00)
GFR calc Af Amer: 71 mL/min/{1.73_m2} (ref >59)
GFR calc non Af Amer: 61 mL/min/{1.73_m2} (ref >59)
Globulin, Total: 3.1 g/dL (ref 1.5–4.5)
Glucose: 201 mg/dL — ABNORMAL HIGH (ref 65–99)
Potassium: 4.3 mmol/L (ref 3.5–5.2)
Sodium: 136 mmol/L (ref 134–144)
Total Protein: 7.1 g/dL (ref 6.0–8.5)

## 2019-05-31 LAB — THYROID PANEL WITH TSH
Free Thyroxine Index: 1.8 (ref 1.2–4.9)
T3 Uptake Ratio: 24 % (ref 24–39)
T4, Total: 7.5 ug/dL (ref 4.5–12.0)
TSH: 7.29 u[IU]/mL — ABNORMAL HIGH (ref 0.450–4.500)

## 2019-05-31 LAB — BAYER DCA HB A1C WAIVED: HB A1C (BAYER DCA - WAIVED): 7.6 % — ABNORMAL HIGH (ref <7.0)

## 2019-06-05 ENCOUNTER — Emergency Department
Admission: EM | Admit: 2019-06-05 | Discharge: 2019-06-05 | Disposition: A | Discharge status: Home / Self Care | Payer: Medicaid Other | Attending: Emergency Medicine | Admitting: Emergency Medicine

## 2019-06-05 ENCOUNTER — Telehealth: Payer: Self-pay | Admitting: Nurse Practitioner

## 2019-06-05 ENCOUNTER — Other Ambulatory Visit: Payer: Self-pay

## 2019-06-05 ENCOUNTER — Emergency Department: Payer: Medicaid Other

## 2019-06-05 ENCOUNTER — Encounter: Payer: Self-pay | Admitting: Emergency Medicine

## 2019-06-05 DIAGNOSIS — F0390 Unspecified dementia without behavioral disturbance: Secondary | ICD-10-CM | POA: Diagnosis not present

## 2019-06-05 DIAGNOSIS — Y929 Unspecified place or not applicable: Secondary | ICD-10-CM | POA: Insufficient documentation

## 2019-06-05 DIAGNOSIS — W19XXXA Unspecified fall, initial encounter: Secondary | ICD-10-CM | POA: Diagnosis not present

## 2019-06-05 DIAGNOSIS — Z794 Long term (current) use of insulin: Secondary | ICD-10-CM | POA: Diagnosis not present

## 2019-06-05 DIAGNOSIS — E1122 Type 2 diabetes mellitus with diabetic chronic kidney disease: Secondary | ICD-10-CM | POA: Diagnosis not present

## 2019-06-05 DIAGNOSIS — Y939 Activity, unspecified: Secondary | ICD-10-CM | POA: Insufficient documentation

## 2019-06-05 DIAGNOSIS — I129 Hypertensive chronic kidney disease with stage 1 through stage 4 chronic kidney disease, or unspecified chronic kidney disease: Secondary | ICD-10-CM | POA: Diagnosis not present

## 2019-06-05 DIAGNOSIS — Y999 Unspecified external cause status: Secondary | ICD-10-CM | POA: Insufficient documentation

## 2019-06-05 DIAGNOSIS — S7001XA Contusion of right hip, initial encounter: Secondary | ICD-10-CM | POA: Diagnosis not present

## 2019-06-05 DIAGNOSIS — S79911A Unspecified injury of right hip, initial encounter: Secondary | ICD-10-CM | POA: Diagnosis present

## 2019-06-05 DIAGNOSIS — N183 Chronic kidney disease, stage 3 (moderate): Secondary | ICD-10-CM | POA: Diagnosis not present

## 2019-06-05 HISTORY — DX: Unspecified dementia, unspecified severity, without behavioral disturbance, psychotic disturbance, mood disturbance, and anxiety: F03.90

## 2019-06-05 LAB — COMPREHENSIVE METABOLIC PANEL
ALT: 15 U/L (ref 0–44)
AST: 22 U/L (ref 15–41)
Albumin: 4.2 g/dL (ref 3.5–5.0)
Alkaline Phosphatase: 61 U/L (ref 38–126)
Anion gap: 8 (ref 5–15)
BUN: 18 mg/dL (ref 6–20)
CO2: 27 mmol/L (ref 22–32)
Calcium: 9.4 mg/dL (ref 8.9–10.3)
Chloride: 104 mmol/L (ref 98–111)
Creatinine, Ser: 0.85 mg/dL (ref 0.44–1.00)
GFR calc Af Amer: >60 mL/min (ref >60)
GFR calc non Af Amer: >60 mL/min (ref >60)
Glucose, Bld: 178 mg/dL — ABNORMAL HIGH (ref 70–99)
Potassium: 4.3 mmol/L (ref 3.5–5.1)
Sodium: 139 mmol/L (ref 135–145)
Total Bilirubin: 0.9 mg/dL (ref 0.3–1.2)
Total Protein: 7.8 g/dL (ref 6.5–8.1)

## 2019-06-05 LAB — CBC WITH DIFFERENTIAL/PLATELET
Abs Immature Granulocytes: 0.01 10*3/uL (ref 0.00–0.07)
Basophils Absolute: 0 10*3/uL (ref 0.0–0.1)
Basophils Relative: 1 %
Eosinophils Absolute: 0.1 10*3/uL (ref 0.0–0.5)
Eosinophils Relative: 1 %
HCT: 41.9 % (ref 36.0–46.0)
Hemoglobin: 14.5 g/dL (ref 12.0–15.0)
Immature Granulocytes: 0 %
Lymphocytes Relative: 43 %
Lymphs Abs: 2.4 10*3/uL (ref 0.7–4.0)
MCH: 29.7 pg (ref 26.0–34.0)
MCHC: 34.6 g/dL (ref 30.0–36.0)
MCV: 85.7 fL (ref 80.0–100.0)
Monocytes Absolute: 0.4 10*3/uL (ref 0.1–1.0)
Monocytes Relative: 8 %
Neutro Abs: 2.5 10*3/uL (ref 1.7–7.7)
Neutrophils Relative %: 47 %
Platelets: 384 10*3/uL (ref 150–400)
RBC: 4.89 MIL/uL (ref 3.87–5.11)
RDW: 11.8 % (ref 11.5–15.5)
WBC: 5.4 10*3/uL (ref 4.0–10.5)
nRBC: 0 % (ref 0.0–0.2)

## 2019-06-05 NOTE — ED Provider Notes (Signed)
[facility] Emergency Department Provider Note  ____________________________________________   I have reviewed the triage vital signs and the nursing notes. Where available I have reviewed prior notes and, if possible and indicated, outside hospital notes.   Patient seen and evaluated during the coronavirus epidemic during a time with low staffing  Patient seen for the symptoms described in the history of present illness. She was evaluated in the context of the global COVID-19 pandemic, which necessitated consideration that the patient might be at risk for infection with the SARS-CoV-2 virus that causes COVID-19. Institutional protocols and algorithms that pertain to the evaluation of patients at risk for COVID-19 are in a state of rapid change based on information released by regulatory bodies including the CDC and federal and state organizations. These policies and algorithms were followed during the patient's care in the ED.    HISTORY  Chief Complaint Fall    HPI Bethany James is a 58 y.o. female  History of anemia and dementia and diabetes mellitus, who is sugars routinely elevated, presents today with a bruise on her right hip from a fall 2 days ago.  Did not pass out, no seizure activity, complaining of headache, at her neurologic baseline.  Family were worried that she had bruising to her hip.  Able to ambulate with no difficulty.  Eating and drinking well.  Had chicken right before she came in.  No vomiting.  Otherwise unremarkable in all respects according to family.  Sugars have been running somewhat high but are back to baseline at this time.  170 for her is a good sugar.     Past Medical History:  Diagnosis Date  . Anemia   . Dementia ([place])   . Diabetes mellitus without complication [facility])     Patient Active Problem List   Diagnosis Date Noted  . Cognitive impairment 05/18/2019  . Duodenal ulcer 04/24/2019  . Elevated TSH 04/24/2019  . Type 2  diabetes mellitus with nephropathy ([place]) 04/09/2019  . Generalized anxiety disorder 04/09/2019  . Depression, major, single episode, moderate ([place]) 04/09/2019  . Hypertensive heart/kidney disease without HF and with CKD stage III ([place]) 04/09/2019  . CKD stage 3 due to type 2 diabetes mellitus ([place]) 04/09/2019  . Hyperlipidemia associated with type 2 diabetes mellitus ([place]) 04/09/2019  . Generalized abdominal pain 04/09/2019  . Type 2 diabetes mellitus with hyperglycemia ([place]) 02/20/2019    Past Surgical History:  Procedure Laterality Date  . ABDOMINAL HYSTERECTOMY    . CHOLECYSTECTOMY    . ESOPHAGOGASTRODUODENOSCOPY N/A 02/21/2019   Procedure: ESOPHAGOGASTRODUODENOSCOPY (EGD);  Surgeon: Toledo, Benay Pike, MD;  Location: [facility] ENDOSCOPY;  Service: Gastroenterology;  Laterality: N/A;  ICU  . ESOPHAGOGASTRODUODENOSCOPY (EGD) WITH PROPOFOL N/A 02/24/2019   Procedure: ESOPHAGOGASTRODUODENOSCOPY (EGD) WITH PROPOFOL;  Surgeon: Toledo, Benay Pike, MD;  Location: [facility] ENDOSCOPY;  Service: Gastroenterology;  Laterality: N/A;  . stomach ulcers removal      Prior to Admission medications   Medication Sig Start Date End Date Taking? Authorizing Provider  donepezil (ARICEPT) 5 MG tablet Take 1 tablet by mouth daily. 05/14/19 08/12/19  [provider]  DULoxetine (CYMBALTA) 30 MG capsule Take one tablet (30 MG) daily by mouth for one week and then may increase to two tablets (60 MG) daily by mouth. 04/09/19   Cannady, Henrine Screws T, NP  insulin aspart (NOVOLOG) 100 UNIT/ML FlexPen Inject 5 Units into the skin 3 (three) times daily with meals. 04/09/19   Cannady, Henrine Screws T, NP  Insulin Detemir (LEVEMIR) 100  UNIT/ML Pen Inject 10 Units into the skin daily. 04/09/19   Cannady, Corrie DandyJolene T, NP  Insulin Pen Needle 31G X 4 MM MISC 4 pens by Does not apply route 4 (four) times daily. To be used with insulin pens. Patient not taking: Reported on 04/25/2019 04/12/19   Aura Dialsannady, Jolene T, NP    Allergies Patient has no  known allergies.  Family History  Problem Relation Age of Onset  . Asthma Mother   . Ulcers Mother   . Liver disease Mother   . Diabetes Father   . Obesity Sister   . Kidney cancer Brother   . Alzheimer's disease Paternal Grandmother     Social History Social History   Tobacco Use  . Smoking status: Never Smoker  . Smokeless tobacco: Never Used  Substance Use Topics  . Alcohol use: Not Currently  . Drug use: Not Currently    Review of Systems Constitutional: No fever/chills Eyes: No visual changes. ENT: No sore throat. No stiff neck no neck pain Cardiovascular: Denies chest pain. Respiratory: Denies shortness of breath. Gastrointestinal:   no vomiting.  No diarrhea.  No constipation. Genitourinary: Negative for dysuria. Musculoskeletal: Negative lower extremity swelling Skin: Negative for rash. Neurological: Negative for severe headaches, focal weakness or numbness.   ____________________________________________   PHYSICAL EXAM:  VITAL SIGNS: ED Triage Vitals  Enc Vitals Group     BP 06/05/19 1533 (!) 147/90     Pulse Rate 06/05/19 1533 94     Resp 06/05/19 1533 18     Temp 06/05/19 1533 98.8 F (37.1 C)     Temp Source 06/05/19 1533 Oral     SpO2 06/05/19 1533 98 %     Weight 06/05/19 1538 108 lb (49 kg)     Height 06/05/19 1538 4\' 10"  (1.473 m)     Head Circumference --      Peak Flow --      Pain Score --      Pain Loc --      Pain Edu? --      Excl. in [place]? --     Constitutional: Alert and oriented. Well appearing only demented no acute distress Eyes: Conjunctivae are normal Head: Atraumatic HEENT: No congestion/rhinnorhea. Mucous membranes are moist.  Oropharynx non-erythematous Neck:   Nontender with no meningismus, no masses, no stridor Cardiovascular: Normal rate, regular rhythm. Grossly normal heart sounds.  Good peripheral circulation. Respiratory: Normal respiratory effort.  No retractions. Lungs CTAB. Abdominal: Soft and nontender. No  distention. No guarding no rebound Back:  There is no focal tenderness or step off.  there is no midline tenderness there are no lesions noted. there is no CVA tenderness Musculoskeletal: No lower extremity tenderness, no upper extremity tenderness. No joint effusions, no DVT signs strong distal pulses no edema Neurologic:  Normal speech and language. No gross focal neurologic deficits are appreciated.  Skin:  Skin is warm, dry and intact.  Light bruising noted, appears to be healing, right hip region not tender, Psychiatric: Mood and affect are normal. Speech and behavior are normal.  ____________________________________________   LABS (all labs ordered are listed, but only abnormal results are displayed)  Labs Reviewed  COMPREHENSIVE METABOLIC PANEL - Abnormal; Notable for the following components:      Result Value   Glucose, Bld 178 (*)    All other components within normal limits  CBC WITH DIFFERENTIAL/PLATELET    Pertinent labs  results that were available during my care of the patient were reviewed  by me and considered in my medical decision making (see chart for details). ____________________________________________  EKG  I personally interpreted any EKGs ordered by me or triage  ____________________________________________  RADIOLOGY  Pertinent labs & imaging results that were available during my care of the patient were reviewed by me and considered in my medical decision making (see chart for details). If possible, patient and/or family made aware of any abnormal findings.  Dg Femur, Min 2 Views Right  Result Date: 06/05/2019 CLINICAL DATA:  Pain status post fall EXAM: RIGHT FEMUR 2 VIEWS COMPARISON:  None. FINDINGS: There is no evidence of fracture or other focal bone lesions. Soft tissues are unremarkable. IMPRESSION: Negative. Electronically Signed   By: Katherine Mantlehristopher  Austad M.D.   On: 06/05/2019 16:13   ____________________________________________     PROCEDURES  Procedure(s) performed: None  Procedures  Critical Care performed: None  ____________________________________________   INITIAL IMPRESSION / ASSESSMENT AND PLAN / ED COURSE  Pertinent labs & imaging results that were available during my care of the patient were reviewed by me and considered in my medical decision making (see chart for details).   Patient here after a non-syncopal fall with concern about bruising to the hip no evidence of fracture, no evidence of numbness or weakness no evidence of DVT no evidence of infection or abscess, otherwise at her baseline, no evidence of significant closed head injury.  I did offer CT scan given her history dementia and the family declined.  I do not think this is unreasonable.  They are very gracious and thankful for our care and eager to go home.  Return precautions follow-up given and understood    ____________________________________________   FINAL CLINICAL IMPRESSION(S) / ED DIAGNOSES  Final diagnoses:  Fall      This chart was dictated using voice recognition software.  Despite best efforts to proofread,  errors can occur which can change meaning.      Jeanmarie PlantMcShane, James A, MD 06/05/19 [phone]

## 2019-06-05 NOTE — ED Triage Notes (Signed)
Pt arrives with family. Pt fell on Sunday night and family contacted pt's PCP. Due to pt having dementia and being a poor historian pcp suggested pt be evaluated in the ED. Only abnormality noticed by family was bruising on pt's right leg. No complaints of pain in triage or to family.

## 2019-06-05 NOTE — Telephone Encounter (Signed)
Noted, thank you and will review UC note one available.  Would benefit from imaging and face to face today.

## 2019-06-05 NOTE — Telephone Encounter (Signed)
Pt's daughter Rubie Maid called in stating that pt had a tumble and is complaining of hip pain. Pt's daughter states that blood sugar is reading 328 and she has given her tylenol. She is wanting to see about having an emergency visit today. Talked to [facility] pt's provider, she is suggesting that pt goes to urgent care due to not having anything available today and wanting imagining done today as well to make sure that everything is ok with her hip. Told pt's daughter this and she verbalized understanding. Routing to provider as Juluis Rainier

## 2019-06-05 NOTE — Discharge Instructions (Addendum)
Turn to the emergency room for any new or worrisome symptoms.  You would prefer not to have a CT scan which is not unreasonable.  However it does limit our ability to further evaluate her from a point of view of her fall.  If there are other concerns, as we discussed, please return to the ER.  Otherwise follow closely with your doctor.  Is a pleasure to meet you

## 2019-06-05 NOTE — ED Notes (Signed)
Patient transported to X-ray 

## 2019-06-06 ENCOUNTER — Telehealth: Payer: Self-pay

## 2019-06-09 ENCOUNTER — Other Ambulatory Visit: Payer: Self-pay

## 2019-06-09 ENCOUNTER — Ambulatory Visit (HOSPITAL_COMMUNITY)
Admission: RE | Admit: 2019-06-09 | Discharge: 2019-06-09 | Disposition: A | Discharge status: Home / Self Care | Payer: Medicaid Other | Source: Ambulatory Visit | Attending: Neurology | Admitting: Neurology

## 2019-06-09 DIAGNOSIS — R413 Other amnesia: Secondary | ICD-10-CM | POA: Diagnosis present

## 2019-06-11 ENCOUNTER — Other Ambulatory Visit: Payer: Self-pay

## 2019-06-11 ENCOUNTER — Ambulatory Visit: Payer: Medicaid Other | Admitting: Licensed Clinical Social Worker

## 2019-06-11 DIAGNOSIS — F321 Major depressive disorder, single episode, moderate: Secondary | ICD-10-CM

## 2019-06-11 NOTE — Chronic Care Management (AMB) (Signed)
  Chronic Care Management    Clinical Social Work Follow Up Note  06/11/2019 Name: Bethany James MRN: [id number] DOB: Nov 22, 1960  Bethany James is a 58 y.o. year old female who is a primary care patient of Cannady, Barbaraann Faster, NP. The CCM team was consulted for assistance with Level of Care Concerns.   Review of patient status, including review of consultants reports, other relevant assessments, and collaboration with appropriate care team members and the patient's provider was performed as part of comprehensive patient evaluation and provision of chronic care management services.     Goals Addressed    . "My mom needs long term care placement now."       Current Barriers:  . Financial constraints related to long term care placement . Limited social support . Housing barriers . ADL IADL limitations . Family and relationship dysfunction . Limited education about LTC Medicaid* . Limited access to caregiver . Cognitive Deficits . Memory Deficits . Inability to perform ADL's independently . Inability to perform IADL's independently  Clinical Social Work Clinical Goal(s):  Marland Kitchen Over the next 90 days, client will work with SW to address concerns related to worsening dementia and the need for patient to be placed into a memory care LTC facility   Interventions: . Patient interviewed and appropriate assessments performed . Provided patient with information about the long term placement process.  . Discussed plans with patient for ongoing care management follow up and provided patient with direct contact information for care management team . Advised patient to check email for placement resources that LCSW sent out on 06/11/2019 . Assisted patient/caregiver with obtaining information about health plan benefits . Provided education to patient/caregiver regarding level of care options. . Coordinated 3 way call with patient's family to educate them on the LTC placement process and LTC Medicaid  application and enrollment process. Patient already has Full Adult Medicaid which will make this transition process easier.   Patient Self Care Activities:  . Currently UNABLE TO independently take care of self. Patient is in need of 24/7 care.  Initial goal documentation     Follow Up Plan: SW will follow up with patient by phone over the next month  Eula Fried, [place], MSW, [email or web address] Phone: [phone]

## 2019-06-12 ENCOUNTER — Emergency Department: Payer: Medicaid Other

## 2019-06-12 ENCOUNTER — Other Ambulatory Visit: Payer: Self-pay

## 2019-06-12 ENCOUNTER — Inpatient Hospital Stay
Admission: EM | Admit: 2019-06-12 | Discharge: 2019-06-21 | DRG: 690 | Disposition: A | Discharge status: Skilled Nursing Facility | Payer: Medicaid Other | Attending: Internal Medicine | Admitting: Internal Medicine

## 2019-06-12 ENCOUNTER — Telehealth: Payer: Self-pay | Admitting: Nurse Practitioner

## 2019-06-12 ENCOUNTER — Encounter: Payer: Self-pay | Admitting: Emergency Medicine

## 2019-06-12 DIAGNOSIS — F329 Major depressive disorder, single episode, unspecified: Secondary | ICD-10-CM | POA: Diagnosis present

## 2019-06-12 DIAGNOSIS — E1121 Type 2 diabetes mellitus with diabetic nephropathy: Secondary | ICD-10-CM

## 2019-06-12 DIAGNOSIS — Z794 Long term (current) use of insulin: Secondary | ICD-10-CM

## 2019-06-12 DIAGNOSIS — Z20828 Contact with and (suspected) exposure to other viral communicable diseases: Secondary | ICD-10-CM | POA: Diagnosis present

## 2019-06-12 DIAGNOSIS — F0390 Unspecified dementia without behavioral disturbance: Secondary | ICD-10-CM | POA: Diagnosis present

## 2019-06-12 DIAGNOSIS — Z833 Family history of diabetes mellitus: Secondary | ICD-10-CM

## 2019-06-12 DIAGNOSIS — Z820 Family history of epilepsy and other diseases of the nervous system: Secondary | ICD-10-CM

## 2019-06-12 DIAGNOSIS — Z825 Family history of asthma and other chronic lower respiratory diseases: Secondary | ICD-10-CM

## 2019-06-12 DIAGNOSIS — N390 Urinary tract infection, site not specified: Principal | ICD-10-CM | POA: Diagnosis present

## 2019-06-12 DIAGNOSIS — E11649 Type 2 diabetes mellitus with hypoglycemia without coma: Secondary | ICD-10-CM | POA: Diagnosis present

## 2019-06-12 DIAGNOSIS — B9620 Unspecified Escherichia coli [E. coli] as the cause of diseases classified elsewhere: Secondary | ICD-10-CM | POA: Diagnosis present

## 2019-06-12 DIAGNOSIS — R296 Repeated falls: Secondary | ICD-10-CM | POA: Diagnosis present

## 2019-06-12 DIAGNOSIS — E1165 Type 2 diabetes mellitus with hyperglycemia: Secondary | ICD-10-CM | POA: Diagnosis not present

## 2019-06-12 DIAGNOSIS — Z515 Encounter for palliative care: Secondary | ICD-10-CM

## 2019-06-12 DIAGNOSIS — Z8051 Family history of malignant neoplasm of kidney: Secondary | ICD-10-CM

## 2019-06-12 DIAGNOSIS — R627 Adult failure to thrive: Secondary | ICD-10-CM | POA: Diagnosis present

## 2019-06-12 LAB — CBC WITH DIFFERENTIAL/PLATELET
Abs Immature Granulocytes: 0.01 10*3/uL (ref 0.00–0.07)
Basophils Absolute: 0 10*3/uL (ref 0.0–0.1)
Basophils Relative: 1 %
Eosinophils Absolute: 0.1 10*3/uL (ref 0.0–0.5)
Eosinophils Relative: 2 %
HCT: 44.6 % (ref 36.0–46.0)
Hemoglobin: 15.1 g/dL — ABNORMAL HIGH (ref 12.0–15.0)
Immature Granulocytes: 0 %
Lymphocytes Relative: 34 %
Lymphs Abs: 1.8 10*3/uL (ref 0.7–4.0)
MCH: 29.4 pg (ref 26.0–34.0)
MCHC: 33.9 g/dL (ref 30.0–36.0)
MCV: 86.9 fL (ref 80.0–100.0)
Monocytes Absolute: 0.4 10*3/uL (ref 0.1–1.0)
Monocytes Relative: 7 %
Neutro Abs: 3 10*3/uL (ref 1.7–7.7)
Neutrophils Relative %: 56 %
Platelets: 374 10*3/uL (ref 150–400)
RBC: 5.13 MIL/uL — ABNORMAL HIGH (ref 3.87–5.11)
RDW: 11.9 % (ref 11.5–15.5)
WBC: 5.3 10*3/uL (ref 4.0–10.5)
nRBC: 0 % (ref 0.0–0.2)

## 2019-06-12 LAB — COMPREHENSIVE METABOLIC PANEL
ALT: 17 U/L (ref 0–44)
AST: 29 U/L (ref 15–41)
Albumin: 4.2 g/dL (ref 3.5–5.0)
Alkaline Phosphatase: 61 U/L (ref 38–126)
Anion gap: 7 (ref 5–15)
BUN: 26 mg/dL — ABNORMAL HIGH (ref 6–20)
CO2: 29 mmol/L (ref 22–32)
Calcium: 9.9 mg/dL (ref 8.9–10.3)
Chloride: 102 mmol/L (ref 98–111)
Creatinine, Ser: 0.95 mg/dL (ref 0.44–1.00)
GFR calc Af Amer: >60 mL/min (ref >60)
GFR calc non Af Amer: >60 mL/min (ref >60)
Glucose, Bld: 102 mg/dL — ABNORMAL HIGH (ref 70–99)
Potassium: 4.3 mmol/L (ref 3.5–5.1)
Sodium: 138 mmol/L (ref 135–145)
Total Bilirubin: 0.7 mg/dL (ref 0.3–1.2)
Total Protein: 7.7 g/dL (ref 6.5–8.1)

## 2019-06-12 LAB — SARS CORONAVIRUS 2 BY RT PCR (HOSPITAL ORDER, PERFORMED IN ~~LOC~~ HOSPITAL LAB): SARS Coronavirus 2: NEGATIVE

## 2019-06-12 LAB — GLUCOSE, CAPILLARY: Glucose-Capillary: 177 mg/dL — ABNORMAL HIGH (ref 70–99)

## 2019-06-12 LAB — LIPASE, BLOOD: Lipase: 54 U/L — ABNORMAL HIGH (ref 11–51)

## 2019-06-12 MED ORDER — LORAZEPAM 2 MG/ML IJ SOLN
INTRAMUSCULAR | Status: AC
  Administered 2019-06-12: 18:00:00 1 mg via INTRAVENOUS
  Filled 2019-06-12: qty 1

## 2019-06-12 MED ORDER — LORAZEPAM 2 MG/ML IJ SOLN
1.0000 mg | Freq: Once | INTRAMUSCULAR | Status: AC
  Administered 2019-06-12: 18:00:00 1 mg via INTRAVENOUS

## 2019-06-12 NOTE — Telephone Encounter (Signed)
Spoke to daughter via telephone

## 2019-06-12 NOTE — ED Notes (Signed)
Patient resting on stretcher. Even and non labored respirations noted. Denies needs. Will continue to monitor.

## 2019-06-12 NOTE — ED Triage Notes (Signed)
Patient presents to ED via [facility] from home. Patient lives with her daughter. Patient told EMS she was having 7/10 abdominal pain. History of gastric ulcers that requires surgical intervention. On arrival patient denies abdominal pain. Denies any complaints. Family report history of dementia. When asked why patient is here she states "to find out where they are going to place me". Patient A&O x3, disoriented to situation.

## 2019-06-12 NOTE — ED Notes (Signed)
Pt talking to chaplin at this time.
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
## 2019-06-12 NOTE — Telephone Encounter (Signed)
Spoke to patient's daughter via telephone.  Patient is in ER at this time due to her daughter's concern about patient's behaviors increasing and she keeps trying to fall at home.  Her daughter reports this is "purposefully trying to fall".  Is wishing for her mother to have placement and inquired into how long that may take.  Discussed with daughter that there are many variances in this regard, especially with Covid being present.  Encouraged daughter to listen to communication from hospital SW and staff in regard to options available.  Daughter not wishing to bring patient back into home due to her concern for her children's safety.

## 2019-06-12 NOTE — ED Notes (Signed)
Pt given crackers and beverage and reassured about disposition.
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
## 2019-06-12 NOTE — ED Provider Notes (Signed)
[facility] Emergency Department Provider Note   ____________________________________________    I have reviewed the triage vital signs and the nursing notes.   HISTORY  Chief Complaint Failure To Thrive   Patient is unable to provide significant history  HPI Bethany James is a 58 y.o. female with a history of dementia, diabetes who presents today with reports of falls.  Patient is unable to give a significant history.  There reports that the patient is here for placement.  She herself has no complaints.  No abdominal pain reported.  No injuries noted from fall  Past Medical History:  Diagnosis Date  . Anemia   . Dementia ([facility])   . Diabetes mellitus without complication [facility])     Patient Active Problem List   Diagnosis Date Noted  . Cognitive impairment 05/18/2019  . Duodenal ulcer 04/24/2019  . Elevated TSH 04/24/2019  . Type 2 diabetes mellitus with nephropathy ([facility]) 04/09/2019  . Generalized anxiety disorder 04/09/2019  . Depression, major, single episode, moderate ([facility]) 04/09/2019  . Hypertensive heart/kidney disease without HF and with CKD stage III ([facility]) 04/09/2019  . CKD stage 3 due to type 2 diabetes mellitus ([facility]) 04/09/2019  . Hyperlipidemia associated with type 2 diabetes mellitus ([facility]) 04/09/2019  . Generalized abdominal pain 04/09/2019  . Type 2 diabetes mellitus with hyperglycemia ([facility]) 02/20/2019    Past Surgical History:  Procedure Laterality Date  . ABDOMINAL HYSTERECTOMY    . CHOLECYSTECTOMY    . ESOPHAGOGASTRODUODENOSCOPY N/A 02/21/2019   Procedure: ESOPHAGOGASTRODUODENOSCOPY (EGD);  Surgeon: Toledo, Boykin Nearingeodoro K, MD;  Location: [facility] ENDOSCOPY;  Service: Gastroenterology;  Laterality: N/A;  ICU  . ESOPHAGOGASTRODUODENOSCOPY (EGD) WITH PROPOFOL N/A 02/24/2019   Procedure: ESOPHAGOGASTRODUODENOSCOPY (EGD) WITH PROPOFOL;  Surgeon: Toledo, Boykin Nearingeodoro K, MD;  Location: [facility] ENDOSCOPY;  Service: Gastroenterology;  Laterality: N/A;  .  stomach ulcers removal      Prior to Admission medications   Medication Sig Start Date End Date Taking? Authorizing Provider  donepezil (ARICEPT) 5 MG tablet Take 1 tablet by mouth daily. 05/14/19 08/12/19 Yes [provider]  DULoxetine (CYMBALTA) 30 MG capsule Take one tablet (30 MG) daily by mouth for one week and then may increase to two tablets (60 MG) daily by mouth. 04/09/19  Yes Cannady, Jolene T, NP  insulin aspart (NOVOLOG) 100 UNIT/ML FlexPen Inject 5 Units into the skin 3 (three) times daily with meals. 04/09/19  Yes Cannady, Jolene T, NP  Insulin Detemir (LEVEMIR) 100 UNIT/ML Pen Inject 10 Units into the skin daily. 04/09/19  Yes Marjie Skiffannady, Jolene T, NP     Allergies Patient has no known allergies.  Family History  Problem Relation Age of Onset  . Asthma Mother   . Ulcers Mother   . Liver disease Mother   . Diabetes Father   . Obesity Sister   . Kidney cancer Brother   . Alzheimer's disease Paternal Grandmother     Social History Social History   Tobacco Use  . Smoking status: Never Smoker  . Smokeless tobacco: Never Used  Substance Use Topics  . Alcohol use: Not Currently  . Drug use: Not Currently    Review of Systems  Constitutional: Denies fevers Eyes: No visual changes.  ENT: No neck pain Cardiovascular: Denies chest pain. Respiratory: Denies shortness of breath. Gastrointestinal: No abdominal pain.  Genitourinary: No groin injury Musculoskeletal: Negative for back pain.  No extremity injury Skin: Negative for rash. Neurological: Negative for headaches    ____________________________________________   PHYSICAL EXAM:  VITAL SIGNS: ED Triage Vitals [06/12/19 1037]  Enc Vitals Group     BP 120/87     Pulse Rate 83     Resp 20     Temp 98.3 F (36.8 C)     Temp Source Oral     SpO2 100 %     Weight      Height      Head Circumference      Peak Flow      Pain Score 0     Pain Loc      Pain Edu?      Excl. in [place]?      Constitutional: Alert and oriented. Eyes: Conjunctivae are normal.  Head: Atraumatic. Nose: No congestion/rhinnorhea. Mouth/Throat: Mucous membranes are moist.   Neck:  Painless ROM, normal range of motion Cardiovascular: Normal rate, regular rhythm. Grossly normal heart sounds.  Good peripheral circulation.  No chest wall tenderness palpation Respiratory: Normal respiratory effort.  No retractions. Lungs CTAB. Gastrointestinal: Soft and nontender. No distention.  No CVA tenderness.  Musculoskeletal: Normal range of motion of all extremities, no pain with axial load on both lower extremities. Neurologic:   No gross focal neurologic deficits are appreciated.  Skin:  Skin is warm, dry and intact. No rash noted. Psychiatric: Mood and affect are normal. Speech and behavior are normal.  ____________________________________________   LABS (all labs ordered are listed, but only abnormal results are displayed)  Labs Reviewed  CBC WITH DIFFERENTIAL/PLATELET - Abnormal; Notable for the following components:      Result Value   RBC 5.13 (*)    Hemoglobin 15.1 (*)    All other components within normal limits  COMPREHENSIVE METABOLIC PANEL - Abnormal; Notable for the following components:   Glucose, Bld 102 (*)    BUN 26 (*)    All other components within normal limits  LIPASE, BLOOD - Abnormal; Notable for the following components:   Lipase 54 (*)    All other components within normal limits  SARS CORONAVIRUS 2 (HOSPITAL ORDER, [facility])  URINALYSIS, COMPLETE (UACMP) WITH MICROSCOPIC   ____________________________________________  EKG  None ____________________________________________  RADIOLOGY  Chest x-ray unremarkable ____________________________________________   PROCEDURES  Procedure(s) performed: No  Procedures   Critical Care performed: No ____________________________________________   INITIAL IMPRESSION / ASSESSMENT AND PLAN / ED  COURSE  Pertinent labs & imaging results that were available during my care of the patient were reviewed by me and considered in my medical decision making (see chart for details).  Patient overall well-appearing in no acute distress, she appears to be at her baseline.  Lab work is unremarkable, chest x-ray is benign.  Vital signs are reassuring.  No indication for admission at this time, social work is evaluating the patient    ____________________________________________   FINAL CLINICAL IMPRESSION(S) / ED DIAGNOSES  Final diagnoses:  Adult failure to thrive        Note:  This document was prepared using Dragon voice recognition software and may include unintentional dictation errors.   Lavonia Drafts, MD 06/12/19 1424

## 2019-06-12 NOTE — ED Notes (Signed)
In and out cath to obtained urine sample is not indicated at this time per Corky Downs MD.

## 2019-06-12 NOTE — TOC Initial Note (Signed)
Transition of Care [facility]) - Initial/Assessment Note    Patient Details  Name: Bethany James MRN: [id number] Date of Birth: 1961/09/13  Transition of Care [facility]) CM/SW Contact:    Fredric Mare, LCSW Phone Number: 06/12/2019, 5:11 PM  Clinical Narrative:                  Patient is a 57 year old female that presents to the ED for placement. Patient is poor historian. CSW contacted patient's daughter, Bethany James [phone]) and she shared that patient would not be able to return to her home where she was living. Bethany James stated that patient is unable to take care of herself and requires 24/7 care which she is unable to provide. Bethany James believes that patient is a "hazard to herself and to her grandchildren." Bethany James reported that patient has been falling more consistently, is weak, and has been experiencing some depression symptoms. Bethany James shared that patient has been urinating in cups,and is afraid that one of her children will try to drink it. Lotosha continued to state that the patient is "not stable."  CSW filed APS report, and spoke with Sharlene in intake. Sharlene shared that she will pass on the information, and someone in adult protective services will investigate then reach a case decision and let CSW know.      Expected Discharge Plan: Assisted Living Barriers to Discharge: Inadequate or no insurance(patient only has Medicaid)   Patient Goals and CMS Choice        Expected Discharge Plan and Services Expected Discharge Plan: Assisted Living   Discharge Planning Services: CM Consult                                          Prior Living Arrangements/Services   Lives with:: Adult Children(Patient was living with her daughter, son-in-law and grandchildren) Patient language and need for interpreter reviewed:: Yes Do you feel safe going back to the place where you live?: (daughter does not want her to live with her again)        Care giver support system in place?:  No (comment)   Criminal Activity/Legal Involvement Pertinent to Current Situation/Hospitalization: No - Comment as needed  Activities of Daily Living      Permission Sought/Granted Permission sought to share information with : Family Supports    Share Information with NAME: Bethany James     Permission granted to share info w Relationship: Daughter     Emotional Assessment Appearance:: Appears stated age   Affect (typically observed): Calm Orientation: : Oriented to Self, Oriented to Place, Oriented to  Time Alcohol / Substance Use: Never Used Psych Involvement: No (comment)  Admission diagnosis:  abd pain ems Patient Active Problem List   Diagnosis Date Noted  . Cognitive impairment 05/18/2019  . Duodenal ulcer 04/24/2019  . Elevated TSH 04/24/2019  . Type 2 diabetes mellitus with nephropathy ([place]) 04/09/2019  . Generalized anxiety disorder 04/09/2019  . Depression, major, single episode, moderate ([place]) 04/09/2019  . Hypertensive heart/kidney disease without HF and with CKD stage III ([place]) 04/09/2019  . CKD stage 3 due to type 2 diabetes mellitus ([place]) 04/09/2019  . Hyperlipidemia associated with type 2 diabetes mellitus ([place]) 04/09/2019  . Generalized abdominal pain 04/09/2019  . Type 2 diabetes mellitus with hyperglycemia ([place]) 02/20/2019   PCP:  Venita Lick, NP Pharmacy:   [facility] [address] ([address]), [place] - [phone]  [address] [place] ([place]) [place] [address] Phone: [phone] Fax: [phone]     Social Determinants of Health (SDOH) Interventions    Readmission Risk Interventions No flowsheet data found.

## 2019-06-12 NOTE — ED Notes (Signed)
Pt cleaned up by this RN and Alma Friendly, NT. Pt placed in a hospital bed and is comfortable with no complaints.

## 2019-06-13 ENCOUNTER — Ambulatory Visit: Payer: Medicaid Other | Admitting: Licensed Clinical Social Worker

## 2019-06-13 LAB — GLUCOSE, CAPILLARY
Glucose-Capillary: >600 mg/dL (ref 70–99)
Glucose-Capillary: >600 mg/dL (ref 70–99)
Glucose-Capillary: 530 mg/dL (ref 70–99)
Glucose-Capillary: 366 mg/dL — ABNORMAL HIGH (ref 70–99)
Glucose-Capillary: 226 mg/dL — ABNORMAL HIGH (ref 70–99)
Glucose-Capillary: 224 mg/dL — ABNORMAL HIGH (ref 70–99)

## 2019-06-13 LAB — BASIC METABOLIC PANEL
Anion gap: 11 (ref 5–15)
BUN: 24 mg/dL — ABNORMAL HIGH (ref 6–20)
CO2: 24 mmol/L (ref 22–32)
Calcium: 9.7 mg/dL (ref 8.9–10.3)
Chloride: 98 mmol/L (ref 98–111)
Creatinine, Ser: 1.17 mg/dL — ABNORMAL HIGH (ref 0.44–1.00)
GFR calc Af Amer: 60 mL/min — ABNORMAL LOW (ref >60)
GFR calc non Af Amer: 52 mL/min — ABNORMAL LOW (ref >60)
Glucose, Bld: 901 mg/dL (ref 70–99)
Potassium: 5.4 mmol/L — ABNORMAL HIGH (ref 3.5–5.1)
Sodium: 133 mmol/L — ABNORMAL LOW (ref 135–145)

## 2019-06-13 LAB — BLOOD GAS, VENOUS
Acid-Base Excess: 4.2 mmol/L — ABNORMAL HIGH (ref 0.0–2.0)
Bicarbonate: 28.4 mmol/L — ABNORMAL HIGH (ref 20.0–28.0)
O2 Saturation: 94.3 %
Patient temperature: 37
pCO2, Ven: 40 mmHg — ABNORMAL LOW (ref 44.0–60.0)
pH, Ven: 7.46 — ABNORMAL HIGH (ref 7.250–7.430)
pO2, Ven: 68 mmHg — ABNORMAL HIGH (ref 32.0–45.0)

## 2019-06-13 MED ORDER — INSULIN ASPART 100 UNIT/ML ~~LOC~~ SOLN
5.0000 [IU] | Freq: Once | SUBCUTANEOUS | Status: AC
  Administered 2019-06-13: 5 [IU] via SUBCUTANEOUS
  Filled 2019-06-13: qty 1

## 2019-06-13 MED ORDER — INSULIN ASPART 100 UNIT/ML ~~LOC~~ SOLN
5.0000 [IU] | Freq: Three times a day (TID) | SUBCUTANEOUS | Status: DC
  Administered 2019-06-13 (×2): 5 [IU] via SUBCUTANEOUS
  Filled 2019-06-13 (×2): qty 1

## 2019-06-13 MED ORDER — DONEPEZIL HCL 5 MG PO TABS
5.0000 mg | ORAL_TABLET | Freq: Every day | ORAL | Status: DC
  Administered 2019-06-13 – 2019-06-21 (×8): 5 mg via ORAL
  Filled 2019-06-13 (×8): qty 1

## 2019-06-13 MED ORDER — ENSURE ENLIVE PO LIQD
237.0000 mL | Freq: Every day | ORAL | Status: DC
  Administered 2019-06-14 – 2019-06-17 (×2): 237 mL via ORAL

## 2019-06-13 MED ORDER — DULOXETINE HCL 30 MG PO CPEP
60.0000 mg | ORAL_CAPSULE | Freq: Every day | ORAL | Status: DC
  Administered 2019-06-13 – 2019-06-21 (×8): 60 mg via ORAL
  Filled 2019-06-13 (×2): qty 1
  Filled 2019-06-13: qty 2
  Filled 2019-06-13: qty 1
  Filled 2019-06-13 (×4): qty 2

## 2019-06-13 MED ORDER — SODIUM CHLORIDE 0.9 % IV BOLUS
500.0000 mL | Freq: Once | INTRAVENOUS | Status: AC
  Administered 2019-06-13: 500 mL via INTRAVENOUS

## 2019-06-13 MED ORDER — INSULIN ASPART 100 UNIT/ML ~~LOC~~ SOLN
10.0000 [IU] | Freq: Once | SUBCUTANEOUS | Status: AC
  Administered 2019-06-13: 10 [IU] via INTRAVENOUS
  Filled 2019-06-13: qty 1

## 2019-06-13 MED ORDER — INSULIN ASPART 100 UNIT/ML ~~LOC~~ SOLN
0.0000 [IU] | SUBCUTANEOUS | Status: DC
  Administered 2019-06-14: 5 [IU] via SUBCUTANEOUS
  Filled 2019-06-13: qty 1

## 2019-06-13 MED ORDER — INSULIN ASPART 100 UNIT/ML ~~LOC~~ SOLN: 0.0000 [IU] | Freq: Three times a day (TID) | SUBCUTANEOUS | Status: DC

## 2019-06-13 MED ORDER — INSULIN DETEMIR 100 UNIT/ML ~~LOC~~ SOLN
10.0000 [IU] | Freq: Every day | SUBCUTANEOUS | Status: DC
  Administered 2019-06-13 – 2019-06-18 (×4): 10 [IU] via SUBCUTANEOUS
  Filled 2019-06-13 (×8): qty 0.1

## 2019-06-13 MED ORDER — LORAZEPAM 2 MG/ML IJ SOLN
0.5000 mg | Freq: Once | INTRAMUSCULAR | Status: AC
  Administered 2019-06-13: 0.5 mg via INTRAVENOUS
  Filled 2019-06-13: qty 1

## 2019-06-13 MED ORDER — INSULIN ASPART 100 UNIT/ML ~~LOC~~ SOLN: 0.0000 [IU] | Freq: Every day | SUBCUTANEOUS | Status: DC

## 2019-06-13 NOTE — ED Notes (Signed)
Pt assisted to bedside toilet. Gait steady. Pt able to void without assistance. Assisted pt back to stretcher and provided for comfort and safety. Will continue to assess.

## 2019-06-13 NOTE — ED Notes (Addendum)
Pt assisted to bedside toilet. Gait steady. No distress noted. Will continue to assess.

## 2019-06-13 NOTE — ED Notes (Signed)
Patient ate the bag of chips that came with her dinner and the chicken sandwich.

## 2019-06-13 NOTE — ED Provider Notes (Signed)
Patient's blood sugar checks twice this morning, noted greater than 600.  She is asymptomatic.  Have ordered 10 units of insulin short acting for this morning, plan to recheck her blood sugar every 1 hour as well as a BMP and VBG now.  Does not appear to be in DKA, nursing does report she has been sipping on sugary beverages and juice.  Reviewed PCP notes and her previous medications,.  She is on her home regimen.   Delman Kitten, MD 06/13/19 [phone]

## 2019-06-13 NOTE — ED Notes (Signed)
Pt ambulated to bathroom with assistance.  Changed diaper, gown, and bedding. Given juice and crackers as requested.  Resting quietly in bed

## 2019-06-13 NOTE — Social Work (Signed)
CSW waiting for follow-up from APS.     [place], [place] ED  [phone]

## 2019-06-13 NOTE — Progress Notes (Signed)
Inpatient Diabetes Program Recommendations  AACE/ADA: New Consensus Statement on Inpatient Glycemic Control   Target Ranges:  Prepandial:   less than 140 mg/dL      Peak postprandial:   less than 180 mg/dL (1-2 hours)      Critically ill patients:  140 - 180 mg/dL  Results for Bethany James, Bethany James (MRN [id number]) as of 06/13/2019 10:02  Ref. Range 06/12/2019 17:05 06/13/2019 08:47 06/13/2019 08:48 06/13/2019 10:02  Glucose-Capillary Latest Ref Range: 70 - 99 mg/dL 177 (H) >600 (HH) >600 (HH) >600 (HH)  Results for Bethany James, Bethany James (MRN [id number]) as of 06/13/2019 10:02  Ref. Range 06/13/2019 09:10  Glucose Latest Ref Range: 70 - 99 mg/dL [facility])   Results for Bethany James, Bethany James (MRN [id number]) as of 06/13/2019 10:02  Ref. Range 06/12/2019 11:04  Glucose Latest Ref Range: 70 - 99 mg/dL 102 (H)   Review of Glycemic Control  Diabetes history: DM2 Outpatient Diabetes medications: Levemir 10 units daily, Novolog 5 units TID with meals Current orders for Inpatient glycemic control: Levemir 10 units daily, Novolog 5 units TID with meals  Inpatient Diabetes Program Recommendations:   Insulin: Noted glucose of 901 mg/dl this morning at 9:10 am and patient was given Novolog 10 units at 9:05 am today. Finger stick glucose remains >600 mg/dl at 10:02. May need to consider using IV insulin for hyperglycemia.  If MD prefers to continue with SQ insulin, please order CBGs Q4H with Novolog 0-9 units Q4H (along with Levemir and Novolog meal coverage).  NOTE: Patient presented to [facility] ED by [facility] on 06/12/19 at 10:34 am and initial glucose was 102 mg/dl at 11:04 am on 06/12/19. One finger stick glucose obtained on 06/12/19 at 17:05 which was 177 mg/dl. No insulin ordered or given on 06/12/19 and finger stick glucose this morning was >600 mg/dl and lab glucose 901 mg/dl at 9:10 am, CO2 24, and AG 11. Novolog 10 units given at 9:05 am today. May need to use IV insulin for hyperglycemia.   Thanks, Barnie Alderman, RN, MSN,  CDE Diabetes Coordinator Inpatient Diabetes Program [phone] (Team Pager from 8am to 5pm)

## 2019-06-13 NOTE — ED Notes (Signed)
Patient given diet Sprite with meal tray. Patient is eating chips that came with meal at this time.

## 2019-06-13 NOTE — ED Notes (Signed)
Patient was assisted to room commode by sitter. Patient is resting at this time.

## 2019-06-13 NOTE — ED Notes (Signed)
Patient is sitting up in hospital bed, asking for "IV." Previous RN taking care of patient states that the patient means Ativan when asking for an IV. Patient will also ask for juice despite high blood sugars.

## 2019-06-13 NOTE — ED Notes (Signed)
Pt assisted to bathroom by this RN. Pt also given crackers and beverages and assisted back to bed.

## 2019-06-13 NOTE — ED Provider Notes (Signed)
-----------------------------------------   6:08 AM on 06/13/2019 -----------------------------------------   Blood pressure 108/80, pulse 88, temperature 98.3 F (36.8 C), temperature source Oral, resp. rate 16, SpO2 97 %.  The patient is calm and cooperative at this time.  There have been no acute events since the last update.  Awaiting disposition plan from Social Work team(s).   Paulette Blanch, MD 06/13/19 [phone]

## 2019-06-13 NOTE — ED Notes (Addendum)
Patient assisted to room commode. Patient was unsteady with transition up and down. Patient's daughter called for update. Patient's daughter states patient's blood sugar stays normally in the 200's.

## 2019-06-13 NOTE — ED Provider Notes (Signed)
Labs reviewed, insulin dosing has improved blood sugar.  Diabetes nurse educators provide recommendations, we have updated orders to reflect that.  No evidence of DKA.  We will continue to monitor the patient's blood sugars closely, new insulin regimen being utilized now.   Delman Kitten, MD 06/13/19 [phone]

## 2019-06-14 LAB — GLUCOSE, CAPILLARY
Glucose-Capillary: 159 mg/dL — ABNORMAL HIGH (ref 70–99)
Glucose-Capillary: 161 mg/dL — ABNORMAL HIGH (ref 70–99)
Glucose-Capillary: 164 mg/dL — ABNORMAL HIGH (ref 70–99)
Glucose-Capillary: 268 mg/dL — ABNORMAL HIGH (ref 70–99)
Glucose-Capillary: 296 mg/dL — ABNORMAL HIGH (ref 70–99)
Glucose-Capillary: 340 mg/dL — ABNORMAL HIGH (ref 70–99)
Glucose-Capillary: 61 mg/dL — ABNORMAL LOW (ref 70–99)
Glucose-Capillary: >600 mg/dL (ref 70–99)

## 2019-06-14 MED ORDER — INSULIN ASPART 100 UNIT/ML ~~LOC~~ SOLN
5.0000 [IU] | Freq: Three times a day (TID) | SUBCUTANEOUS | Status: DC
  Administered 2019-06-15 – 2019-06-17 (×4): 5 [IU] via SUBCUTANEOUS
  Filled 2019-06-14 (×3): qty 1

## 2019-06-14 MED ORDER — INSULIN ASPART 100 UNIT/ML ~~LOC~~ SOLN
0.0000 [IU] | Freq: Three times a day (TID) | SUBCUTANEOUS | Status: DC
  Administered 2019-06-14: 13:00:00 5 [IU] via SUBCUTANEOUS
  Administered 2019-06-14: 7 [IU] via SUBCUTANEOUS
  Administered 2019-06-16: 5 [IU] via SUBCUTANEOUS
  Filled 2019-06-14 (×4): qty 1

## 2019-06-14 MED ORDER — DEXTROSE 50 % IV SOLN
25.0000 mL | Freq: Once | INTRAVENOUS | Status: AC
  Administered 2019-06-14: 25 mL via INTRAVENOUS
  Filled 2019-06-14: qty 50

## 2019-06-14 MED ORDER — INSULIN ASPART 100 UNIT/ML ~~LOC~~ SOLN: 0.0000 [IU] | Freq: Three times a day (TID) | SUBCUTANEOUS | Status: DC

## 2019-06-14 MED ORDER — INSULIN ASPART 100 UNIT/ML ~~LOC~~ SOLN
0.0000 [IU] | Freq: Every day | SUBCUTANEOUS | Status: DC
  Administered 2019-06-15: 3 [IU] via SUBCUTANEOUS
  Filled 2019-06-14: qty 1

## 2019-06-14 NOTE — ED Notes (Signed)
Pt only ate grapes off of dinner tray

## 2019-06-14 NOTE — ED Notes (Signed)
Dr Alfred Levins notified of pt's current FSBS; st no insulin to be given at this time and will change orders

## 2019-06-14 NOTE — NC FL2 (Signed)
[place] MEDICAID FL2 LEVEL OF CARE SCREENING TOOL     IDENTIFICATION  Patient Name: Debbe OdeaJamica Hayton Birthdate: 12/08/1960 Sex: female Admission Date (Current Location): 06/12/2019  [place]   Facility and Address:  [facility], [address], [place], [place] [address]      Provider Number: [id number]  Attending Physician Name and Address:  No att. providers found  Relative Name and Phone Number:  Sima MatasLotosha  [phone]    Current Level of Care: Hospital Recommended Level of Care: Assisted Living Facility Prior Approval Number:    Date Approved/Denied:   PASRR Number:    Discharge Plan: Other (Comment)(Assisted Living Faciltiy)    Current Diagnoses: Patient Active Problem List   Diagnosis Date Noted  . Cognitive impairment 05/18/2019  . Duodenal ulcer 04/24/2019  . Elevated TSH 04/24/2019  . Type 2 diabetes mellitus with nephropathy (HCC) 04/09/2019  . Generalized anxiety disorder 04/09/2019  . Depression, major, single episode, moderate (HCC) 04/09/2019  . Hypertensive heart/kidney disease without HF and with CKD stage III (HCC) 04/09/2019  . CKD stage 3 due to type 2 diabetes mellitus (HCC) 04/09/2019  . Hyperlipidemia associated with type 2 diabetes mellitus (HCC) 04/09/2019  . Generalized abdominal pain 04/09/2019  . Type 2 diabetes mellitus with hyperglycemia (HCC) 02/20/2019    Orientation RESPIRATION BLADDER Height & Weight     Self, Time, Place  Normal Continent Weight:   Height:     BEHAVIORAL SYMPTOMS/MOOD NEUROLOGICAL BOWEL NUTRITION STATUS      Continent Diet(Fluid Consistency: Thin)  AMBULATORY STATUS COMMUNICATION OF NEEDS Skin   Limited Assist Verbally Normal                       Personal Care Assistance Level of Assistance  Bathing, Feeding Bathing Assistance: Limited assistance Feeding assistance: Independent       Functional Limitations Info  Sight, Hearing, Speech Sight  Info: Adequate Hearing Info: Adequate Speech Info: Adequate    SPECIAL CARE FACTORS FREQUENCY                       Contractures Contractures Info: Not present    Additional Factors Info  Code Status, Allergies Code Status Info: FULL Allergies Info: No known allergies           Current Medications (06/14/2019):  This is the current hospital active medication list Current Facility-Administered Medications  Medication Dose Route Frequency Provider Last Rate Last Dose  . donepezil (ARICEPT) tablet 5 mg  5 mg Oral Daily Irean HongSung, Jade J, MD   5 mg at 06/14/19 [id number]  . DULoxetine (CYMBALTA) DR capsule 60 mg  60 mg Oral Daily Irean HongSung, Jade J, MD   60 mg at 06/14/19 0924  . feeding supplement (ENSURE ENLIVE) (ENSURE ENLIVE) liquid 237 mL  237 mL Oral Daily Irean HongSung, Jade J, MD   237 mL at 06/14/19 0956  . insulin aspart (novoLOG) injection 0-5 Units  0-5 Units Subcutaneous QHS Don PerkingVeronese, [place], MD      . insulin aspart (novoLOG) injection 0-9 Units  0-9 Units Subcutaneous TID WC Nita SickleVeronese, [place], MD   5 Units at 06/14/19 1314  . insulin aspart (novoLOG) injection 5 Units  5 Units Subcutaneous TID WC Willy Eddyobinson, [place], MD      . insulin detemir (LEVEMIR) injection 10 Units  10 Units Subcutaneous Daily Irean HongSung, Jade J, MD   10 Units at 06/14/19 1055   Current Outpatient Medications  Medication Sig  Dispense Refill  . donepezil (ARICEPT) 5 MG tablet Take 1 tablet by mouth daily.    . DULoxetine (CYMBALTA) 30 MG capsule Take one tablet (30 MG) daily by mouth for one week and then may increase to two tablets (60 MG) daily by mouth. 60 capsule 3  . insulin aspart (NOVOLOG) 100 UNIT/ML FlexPen Inject 5 Units into the skin 3 (three) times daily with meals. 5 pen 11  . Insulin Detemir (LEVEMIR) 100 UNIT/ML Pen Inject 10 Units into the skin daily. 5 pen 11     Discharge Medications: Please see discharge summary for a list of discharge medications.  Relevant Imaging Results:  Relevant Lab  Results:   Additional Information SSN:  [id number]  Tania Jeidi Gilles, LCSW

## 2019-06-14 NOTE — ED Notes (Signed)
Pt given lunch tray.

## 2019-06-14 NOTE — TOC Progression Note (Addendum)
Transition of Care [facility]) - Progression Note    Patient Details  Name: Bethany James MRN: [id number] Date of Birth: 09-Apr-1961  [facility] CM/SW Contact  Tania Sharia Averitt, LCSW Phone Number: 06/14/2019, 2:49 PM  Clinical Narrative:     Called patient's daughter, Bethany James, to see if she would be able to pay privately for patient's long-term care home. Patient's daughter shared that she did not have the means.  CSW asked her to see if she can change her medicaid to long-term care medicaid. Lotosha shared that the doctor and social worker at [facility] have started this process. CSW emailed [facility] social worker at [employer] to see if this was the case, and she shared that they have not done that as that would be something they family would have to do.    CSW asked Bethany James if she can contact DSS, and get Medicaid changed. Bethany James shared that she would and would contact CSW after she left DSS.   3:30pm - Bethany James shared that two DSS workers have been assigned to help patient transition to LTC Medicaid     - Shawna Orleans    [phone]    [email or web address]    -  Herbie Baltimore    [phone]    [email or web address]  Bethany James stated that CSW should let them know if she has a lead with placement, and they should be able to expedite the process.   3:50pm- CSW faxing out to [facility]  Expected Discharge Plan: Assisted Living Barriers to Discharge: Inadequate or no insurance(patient only has Medicaid)  Expected Discharge Plan and Services Expected Discharge Plan: Assisted Living   Discharge Planning Services: CM Consult                                           Social Determinants of Health (SDOH) Interventions    Readmission Risk Interventions No flowsheet data found.

## 2019-06-14 NOTE — ED Notes (Signed)
ED tech at bedside. Patient ate bag of chips and chicken sandwich.

## 2019-06-14 NOTE — ED Notes (Signed)
Report given to Annie, RN

## 2019-06-14 NOTE — Social Work (Signed)
CSW left voicemail for Herman, APS supervisor, to follow-up regarding this patient.    [place], [place] ED [phone]

## 2019-06-14 NOTE — ED Notes (Signed)
Pt did not eat any of her lunch or drink any juice after offering multiple times to help her eat, Dr. Joan Mayans informed

## 2019-06-14 NOTE — ED Notes (Signed)
Dr. Joan Mayans informed of BG 61, see new orders

## 2019-06-14 NOTE — ED Provider Notes (Signed)
-----------------------------------------   2:54 AM on 06/14/2019 -----------------------------------------   Blood pressure 117/83, pulse 89, temperature 98.1 F (36.7 C), temperature source Oral, resp. rate 16, SpO2 99 %.  The patient had no acute events since last update.  Calm and cooperative at this time.  Disposition is pending per Psychiatry/Behavioral Medicine team recommendations.     Alfred Levins, [place], MD 06/14/19 [phone]

## 2019-06-14 NOTE — ED Notes (Addendum)
Pt assisted to toilet with one person assist, pt's gait is shaky but can get to the toilet with assistance. Peri care performed, chucks and brief changed

## 2019-06-14 NOTE — ED Notes (Signed)
Pt resting quietly with eyes closed on hosp bed in darkened exam room; resp even/unlab with no distress noted; telesitter at bedside

## 2019-06-14 NOTE — ED Notes (Signed)
Pt drank one whole cup of orange juice but refused apple sauce that I brought her

## 2019-06-14 NOTE — ED Notes (Signed)
Pt resting quietly on hosp bed in darkened exam room; eyes closed, resp even/unlab with no distress

## 2019-06-14 NOTE — Social Work (Signed)
Patient faxed out to:   [place]     [phone]  [facility]      Fax  [phone]   Voicmail left for:   [place],   [facility] ED  [phone]

## 2019-06-14 NOTE — Progress Notes (Signed)
Inpatient Diabetes Program Recommendations  AACE/ADA: New Consensus Statement on Inpatient Glycemic Control   Target Ranges:  Prepandial:   less than 140 mg/dL      Peak postprandial:   less than 180 mg/dL (1-2 hours)      Critically ill patients:  140 - 180 mg/dL   Results for Bethany James, Bethany James (MRN [id number]) as of 06/14/2019 08:01  Ref. Range 06/13/2019 08:47 06/13/2019 08:48 06/13/2019 10:02 06/13/2019 11:27 06/13/2019 12:19 06/13/2019 16:16 06/13/2019 20:23 06/14/2019 00:01 06/14/2019 04:24  Glucose-Capillary Latest Ref Range: 70 - 99 mg/dL >600 (HH) >600 (HH) >600 (HH)  Novolog 10 units @ 10:44  Levemir 10 units @ 10:09 530 (HH) 366 (H)  Novolog 5 units 226 (H) 224 (H) 296 (H)  Novolog 5 units@1 :32 159 (H)   Results for [name], [name] (MRN [id number]) as of 06/14/2019 08:01  Ref. Range 06/13/2019 09:10  Glucose Latest Ref Range: 70 - 99 mg/dL 901 (HH)  Novolog 10 units @9 :05   Review of Glycemic Control  Diabetes history: DM2 Outpatient Diabetes medications: Levemir 10 units daily, Novolog 5 units TID with meals Current orders for Inpatient glycemic control: Levemir 10 units daily, Novolog 0-9 units TID with meals, Novolog 0-5 units QHS  Inpatient Diabetes Program Recommendations:   Insulin-Correction: In reviewing the chart, noted Novolog correction was not given at 16:00 or 20:00 on 06/13/19. Recommend to continue Novolog sensitive correction as currently ordered and to administer it when parameters are met.  Insulin-Meal Coverage: Noted Novolog 5 units TID with meals for meal coverage was discontinued. Please consider re-ordering Novolog 5 units TID with meals for meal coverage if patient eats at least 50% of meals.  Thanks, Barnie Alderman, RN, MSN, CDE Diabetes Coordinator Inpatient Diabetes Program [phone] (Team Pager from 8am to 5pm)

## 2019-06-15 LAB — GLUCOSE, CAPILLARY
Glucose-Capillary: 278 mg/dL — ABNORMAL HIGH (ref 70–99)
Glucose-Capillary: 138 mg/dL — ABNORMAL HIGH (ref 70–99)
Glucose-Capillary: 282 mg/dL — ABNORMAL HIGH (ref 70–99)

## 2019-06-15 MED ORDER — HYDROXYZINE HCL 25 MG PO TABS
25.0000 mg | ORAL_TABLET | Freq: Once | ORAL | Status: AC
  Administered 2019-06-15: 25 mg via ORAL
  Filled 2019-06-15: qty 1

## 2019-06-15 NOTE — ED Notes (Signed)
Bs: 138

## 2019-06-15 NOTE — Progress Notes (Signed)
CSW received a call from pt's daughter stating that she would appreciate updates from social work as placement proceeds and CSW provided pt's daughter with the APS worker who was to assess the pt (office# [phone] and cell# [phone]).  Please reconsult if future social work needs arise.  CSW signing off, as social work intervention is no longer needed.  Alphonse Guild. Izabellah Dadisman, LCSW, LCAS, CSI Transitions of Care Clinical Social Worker Care Coordination Department Ph: [phone]

## 2019-06-15 NOTE — ED Provider Notes (Signed)
-----------------------------------------   4:44 AM on 06/15/2019 -----------------------------------------   Blood pressure (!) 137/92, pulse 80, temperature 98.1 F (36.7 C), temperature source Oral, resp. rate 16, SpO2 99 %.  The patient is calm and cooperative at this time.  There have been no acute events since the last update.  Awaiting placement.   Hinda Kehr, MD 06/15/19 [phone]

## 2019-06-15 NOTE — Progress Notes (Signed)
Inpatient Diabetes Program Recommendations  AACE/ADA: New Consensus Statement on Inpatient Glycemic Control   Target Ranges:  Prepandial:   less than 140 mg/dL      Peak postprandial:   less than 180 mg/dL (1-2 hours)      Critically ill patients:  140 - 180 mg/dL   Results for Bethany, James (MRN [id number]) as of 06/15/2019 08:20  Ref. Range 06/14/2019 04:24 06/14/2019 09:21 06/14/2019 12:43 06/14/2019 16:39 06/14/2019 17:37 06/14/2019 21:22 06/15/2019 07:24  Glucose-Capillary Latest Ref Range: 70 - 99 mg/dL 159 (H) 340 (H)  Novolog 7 units  Levemir 10 units @10 :55 268 (H)  Novolog 5 units 61 (L) 161 (H) 164 (H) 278 (H)  Novolog 5 units   Review of Glycemic Control  Diabetes history:DM2 Outpatient Diabetes medications:Levemir 10 units daily, Novolog 5 units TID with meals Current orders for Inpatient glycemic control:Levemir 10 units daily, Novolog 0-9 units TID with meals, Novolog 0-5 units QHS, Novolog 5 units TID with meals for meal coverage  Inpatient Diabetes Program Recommendations:   Insulin-Basal: Please consider increasing Levemir to 12 units daily.  Insulin-Meal Coverage: Noted meal coverage not given at all on 06/14/19 since patient is eating less than 50% of meals.  Thanks, Barnie Alderman, RN, MSN, CDE Diabetes Coordinator Inpatient Diabetes Program [phone] (Team Pager from 8am to 5pm)

## 2019-06-15 NOTE — ED Notes (Signed)
Pt cleaned and brief and gown changed. Peri-care performed. Pt repositioned in bed. This RN will continue to monitor.

## 2019-06-15 NOTE — ED Notes (Signed)
Tried to encourage pt to hydrate and eat her breakfast. Pt refused her Ensure shake and turned her head away with every attempt I made to offer her food. Will try again later.

## 2019-06-15 NOTE — Progress Notes (Signed)
CSW received call from Fabienne Bruns with [place] APS (office# [phone] and cell# [phone]) regarding patient. CSW confirmed that patient was still in the ED. Vernie Shanks reports she will need to complete and interview with patient and she will be at the hospital to do this this afternoon. CSW made charge RN aware.   [place], LCSW Transitions of Care Department [facility] ED [phone]

## 2019-06-16 DIAGNOSIS — Z8051 Family history of malignant neoplasm of kidney: Secondary | ICD-10-CM | POA: Diagnosis not present

## 2019-06-16 DIAGNOSIS — E11649 Type 2 diabetes mellitus with hypoglycemia without coma: Secondary | ICD-10-CM | POA: Diagnosis present

## 2019-06-16 DIAGNOSIS — R296 Repeated falls: Secondary | ICD-10-CM | POA: Diagnosis present

## 2019-06-16 DIAGNOSIS — N390 Urinary tract infection, site not specified: Secondary | ICD-10-CM | POA: Diagnosis present

## 2019-06-16 DIAGNOSIS — R627 Adult failure to thrive: Secondary | ICD-10-CM | POA: Diagnosis not present

## 2019-06-16 DIAGNOSIS — Z825 Family history of asthma and other chronic lower respiratory diseases: Secondary | ICD-10-CM | POA: Diagnosis not present

## 2019-06-16 DIAGNOSIS — Z820 Family history of epilepsy and other diseases of the nervous system: Secondary | ICD-10-CM | POA: Diagnosis not present

## 2019-06-16 DIAGNOSIS — B9620 Unspecified Escherichia coli [E. coli] as the cause of diseases classified elsewhere: Secondary | ICD-10-CM | POA: Diagnosis present

## 2019-06-16 DIAGNOSIS — Z833 Family history of diabetes mellitus: Secondary | ICD-10-CM | POA: Diagnosis not present

## 2019-06-16 DIAGNOSIS — Z794 Long term (current) use of insulin: Secondary | ICD-10-CM | POA: Diagnosis not present

## 2019-06-16 DIAGNOSIS — F329 Major depressive disorder, single episode, unspecified: Secondary | ICD-10-CM | POA: Diagnosis present

## 2019-06-16 DIAGNOSIS — E1165 Type 2 diabetes mellitus with hyperglycemia: Secondary | ICD-10-CM | POA: Diagnosis not present

## 2019-06-16 DIAGNOSIS — Z515 Encounter for palliative care: Secondary | ICD-10-CM | POA: Diagnosis not present

## 2019-06-16 DIAGNOSIS — Z20828 Contact with and (suspected) exposure to other viral communicable diseases: Secondary | ICD-10-CM | POA: Diagnosis present

## 2019-06-16 DIAGNOSIS — F0390 Unspecified dementia without behavioral disturbance: Secondary | ICD-10-CM | POA: Diagnosis present

## 2019-06-16 LAB — COMPREHENSIVE METABOLIC PANEL
ALT: 25 U/L (ref 0–44)
AST: 28 U/L (ref 15–41)
Albumin: 4.5 g/dL (ref 3.5–5.0)
Alkaline Phosphatase: 73 U/L (ref 38–126)
Anion gap: 12 (ref 5–15)
BUN: 23 mg/dL — ABNORMAL HIGH (ref 6–20)
CO2: 25 mmol/L (ref 22–32)
Calcium: 10.2 mg/dL (ref 8.9–10.3)
Chloride: 101 mmol/L (ref 98–111)
Creatinine, Ser: 0.85 mg/dL (ref 0.44–1.00)
GFR calc Af Amer: >60 mL/min (ref >60)
GFR calc non Af Amer: >60 mL/min (ref >60)
Glucose, Bld: 64 mg/dL — ABNORMAL LOW (ref 70–99)
Potassium: 3.7 mmol/L (ref 3.5–5.1)
Sodium: 138 mmol/L (ref 135–145)
Total Bilirubin: 1.3 mg/dL — ABNORMAL HIGH (ref 0.3–1.2)
Total Protein: 8.5 g/dL — ABNORMAL HIGH (ref 6.5–8.1)

## 2019-06-16 LAB — URINALYSIS, COMPLETE (UACMP) WITH MICROSCOPIC
Bilirubin Urine: NEGATIVE
Glucose, UA: >=500 mg/dL — AB
Hgb urine dipstick: NEGATIVE
Ketones, ur: 20 mg/dL — AB
Nitrite: NEGATIVE
Protein, ur: 30 mg/dL — AB
Specific Gravity, Urine: 1.018 (ref 1.005–1.030)
Squamous Epithelial / HPF: NONE SEEN (ref 0–5)
WBC, UA: >50 WBC/hpf — ABNORMAL HIGH (ref 0–5)
pH: 5 (ref 5.0–8.0)

## 2019-06-16 LAB — CBC WITH DIFFERENTIAL/PLATELET
Abs Immature Granulocytes: 0.01 10*3/uL (ref 0.00–0.07)
Basophils Absolute: 0 10*3/uL (ref 0.0–0.1)
Basophils Relative: 0 %
Eosinophils Absolute: 0.1 10*3/uL (ref 0.0–0.5)
Eosinophils Relative: 1 %
HCT: 43.7 % (ref 36.0–46.0)
Hemoglobin: 15.5 g/dL — ABNORMAL HIGH (ref 12.0–15.0)
Immature Granulocytes: 0 %
Lymphocytes Relative: 28 %
Lymphs Abs: 2 10*3/uL (ref 0.7–4.0)
MCH: 29.3 pg (ref 26.0–34.0)
MCHC: 35.5 g/dL (ref 30.0–36.0)
MCV: 82.6 fL (ref 80.0–100.0)
Monocytes Absolute: 0.6 10*3/uL (ref 0.1–1.0)
Monocytes Relative: 9 %
Neutro Abs: 4.6 10*3/uL (ref 1.7–7.7)
Neutrophils Relative %: 62 %
Platelets: 461 10*3/uL — ABNORMAL HIGH (ref 150–400)
RBC: 5.29 MIL/uL — ABNORMAL HIGH (ref 3.87–5.11)
RDW: 11.9 % (ref 11.5–15.5)
WBC: 7.4 10*3/uL (ref 4.0–10.5)
nRBC: 0 % (ref 0.0–0.2)

## 2019-06-16 LAB — GLUCOSE, CAPILLARY
Glucose-Capillary: 290 mg/dL — ABNORMAL HIGH (ref 70–99)
Glucose-Capillary: 133 mg/dL — ABNORMAL HIGH (ref 70–99)
Glucose-Capillary: 362 mg/dL — ABNORMAL HIGH (ref 70–99)
Glucose-Capillary: 276 mg/dL — ABNORMAL HIGH (ref 70–99)

## 2019-06-16 MED ORDER — ONDANSETRON HCL 4 MG PO TABS: 4.0000 mg | ORAL_TABLET | Freq: Four times a day (QID) | ORAL | Status: DC | PRN

## 2019-06-16 MED ORDER — ADULT MULTIVITAMIN W/MINERALS CH
1.0000 | ORAL_TABLET | Freq: Every day | ORAL | Status: DC
  Administered 2019-06-16 – 2019-06-21 (×6): 1 via ORAL
  Filled 2019-06-16 (×6): qty 1

## 2019-06-16 MED ORDER — DEXTROSE 5 % IN LACTATED RINGERS IV BOLUS
1000.0000 mL | Freq: Once | INTRAVENOUS | Status: AC
  Administered 2019-06-16: 1000 mL via INTRAVENOUS
  Filled 2019-06-16: qty 1000

## 2019-06-16 MED ORDER — PANTOPRAZOLE SODIUM 40 MG IV SOLR: 40.0000 mg | INTRAVENOUS | Status: DC

## 2019-06-16 MED ORDER — POLYETHYLENE GLYCOL 3350 17 G PO PACK: 17.0000 g | PACK | Freq: Every day | ORAL | Status: DC | PRN

## 2019-06-16 MED ORDER — ENOXAPARIN SODIUM 40 MG/0.4ML ~~LOC~~ SOLN
40.0000 mg | SUBCUTANEOUS | Status: DC
  Administered 2019-06-16 – 2019-06-17 (×2): 40 mg via SUBCUTANEOUS
  Filled 2019-06-16 (×2): qty 0.4

## 2019-06-16 MED ORDER — SODIUM CHLORIDE 0.9 % IV SOLN
1.0000 g | INTRAVENOUS | Status: DC
  Administered 2019-06-17 – 2019-06-18 (×2): 1 g via INTRAVENOUS
  Filled 2019-06-16 (×2): qty 1
  Filled 2019-06-16: qty 10

## 2019-06-16 MED ORDER — SODIUM CHLORIDE 0.9 % IV SOLN
1.0000 g | Freq: Once | INTRAVENOUS | Status: AC
  Administered 2019-06-16: 1 g via INTRAVENOUS
  Filled 2019-06-16: qty 10

## 2019-06-16 MED ORDER — INSULIN ASPART 100 UNIT/ML ~~LOC~~ SOLN
0.0000 [IU] | Freq: Every day | SUBCUTANEOUS | Status: DC
  Administered 2019-06-16: 3 [IU] via SUBCUTANEOUS
  Filled 2019-06-16: qty 1

## 2019-06-16 MED ORDER — SODIUM CHLORIDE 0.9 % IV SOLN
Freq: Once | INTRAVENOUS | Status: AC
  Administered 2019-06-16: 16:00:00 via INTRAVENOUS

## 2019-06-16 MED ORDER — INSULIN ASPART 100 UNIT/ML ~~LOC~~ SOLN
0.0000 [IU] | Freq: Three times a day (TID) | SUBCUTANEOUS | Status: DC
  Administered 2019-06-17: 13:00:00 2 [IU] via SUBCUTANEOUS
  Administered 2019-06-17: 09:00:00 7 [IU] via SUBCUTANEOUS
  Administered 2019-06-18: 09:00:00 3 [IU] via SUBCUTANEOUS
  Administered 2019-06-18: 7 [IU] via SUBCUTANEOUS
  Administered 2019-06-19: 5 [IU] via SUBCUTANEOUS
  Administered 2019-06-19: 7 [IU] via SUBCUTANEOUS
  Administered 2019-06-19: 1 [IU] via SUBCUTANEOUS
  Administered 2019-06-20: 12:00:00 9 [IU] via SUBCUTANEOUS
  Administered 2019-06-20: 18:00:00 1 [IU] via SUBCUTANEOUS
  Administered 2019-06-20 – 2019-06-21 (×2): 5 [IU] via SUBCUTANEOUS
  Administered 2019-06-21: 2 [IU] via SUBCUTANEOUS
  Filled 2019-06-16 (×10): qty 1

## 2019-06-16 MED ORDER — ACETAMINOPHEN 650 MG RE SUPP: 650.0000 mg | Freq: Four times a day (QID) | RECTAL | Status: DC | PRN

## 2019-06-16 MED ORDER — ACETAMINOPHEN 325 MG PO TABS: 650.0000 mg | ORAL_TABLET | Freq: Four times a day (QID) | ORAL | Status: DC | PRN

## 2019-06-16 MED ORDER — SODIUM CHLORIDE 0.9% FLUSH
3.0000 mL | Freq: Two times a day (BID) | INTRAVENOUS | Status: DC
  Administered 2019-06-16 – 2019-06-21 (×9): 3 mL via INTRAVENOUS

## 2019-06-16 MED ORDER — DEXTROSE IN LACTATED RINGERS 5 % IV SOLN
INTRAVENOUS | Status: DC
  Administered 2019-06-16 – 2019-06-17 (×2): via INTRAVENOUS

## 2019-06-16 MED ORDER — ONDANSETRON HCL 4 MG/2ML IJ SOLN: 4.0000 mg | Freq: Four times a day (QID) | INTRAMUSCULAR | Status: DC | PRN

## 2019-06-16 NOTE — H&P (Signed)
[employer] [facility] [place] [facility]   PATIENT NAME: Bethany OdeaJamica Laredo    MR#:  [id number]  DATE OF BIRTH:  [birth year]  DATE OF ADMISSION:  06/12/2019  PRIMARY CARE PHYSICIAN: Marjie Skiffannady, Jolene T, NP   REQUESTING/REFERRING PHYSICIAN: Daryel NovemberJonathan Williams, MD  CHIEF COMPLAINT:   Chief Complaint  Patient presents with  . Failure To Thrive    HISTORY OF PRESENT ILLNESS:  Bethany James  is a 58 y.o. female with a known history of dementia, diabetes mellitus.  She was originally brought to the hospital from home where she lives with her daughter by EMS services on 06/12/2019 with patient's daughter reporting frequent falls and that she is no longer capable of taking care of the patient who is her mother.  According to social work note, she is to be evaluated on Monday, 06/18/2019 for possible placement.  I have spoken with the nursing staff regarding patient's change in condition.  The nurse taking care of her today has taken care of her 3 days ago.  She reports initially when she was in, she was talkative and eating well as well as ambulating around the room.  Since that time, she has declined and is no longer speaking to the staff nor myself on my interview with her.  She does not make eye contact.  She does not follow commands.  According to the nursing staff, she is no longer eating or drinking.  She had glucose of 64 earlier today and has been started on D5 LR.  Urinalysis was completed demonstrating large amount of bacteria and greater than 50 WBCs.  Therefore she has been admitted to the hospitalist service for further evaluation and management.  PAST MEDICAL HISTORY:   Past Medical History:  Diagnosis Date  . Anemia   . Dementia (HCC)   . Diabetes mellitus without complication (HCC)     PAST SURGICAL HISTORY:   Past Surgical History:  Procedure Laterality Date  . ABDOMINAL HYSTERECTOMY    . CHOLECYSTECTOMY    . ESOPHAGOGASTRODUODENOSCOPY N/A 02/21/2019   Procedure:  ESOPHAGOGASTRODUODENOSCOPY (EGD);  Surgeon: Toledo, Boykin Nearingeodoro K, MD;  Location: [facility] ENDOSCOPY;  Service: Gastroenterology;  Laterality: N/A;  ICU  . ESOPHAGOGASTRODUODENOSCOPY (EGD) WITH PROPOFOL N/A 02/24/2019   Procedure: ESOPHAGOGASTRODUODENOSCOPY (EGD) WITH PROPOFOL;  Surgeon: Toledo, Boykin Nearingeodoro K, MD;  Location: [facility] ENDOSCOPY;  Service: Gastroenterology;  Laterality: N/A;  . stomach ulcers removal      SOCIAL HISTORY:   Social History   Tobacco Use  . Smoking status: Never Smoker  . Smokeless tobacco: Never Used  Substance Use Topics  . Alcohol use: Not Currently    FAMILY HISTORY:   Family History  Problem Relation Age of Onset  . Asthma Mother   . Ulcers Mother   . Liver disease Mother   . Diabetes Father   . Obesity Sister   . Kidney cancer Brother   . Alzheimer's disease Paternal Grandmother     DRUG ALLERGIES:  No Known Allergies  REVIEW OF SYSTEMS:   ROS   Unable to be performed due to patient's condition.   MEDICATIONS AT HOME:   Prior to Admission medications   Medication Sig Start Date End Date Taking? Authorizing Provider  donepezil (ARICEPT) 5 MG tablet Take 1 tablet by mouth daily. 05/14/19 08/12/19 Yes [provider]  DULoxetine (CYMBALTA) 30 MG capsule Take one tablet (30 MG) daily by mouth for one week and then may increase to two tablets (60 MG) daily by mouth. 04/09/19  Yes  Cannady, Jolene T, NP  insulin aspart (NOVOLOG) 100 UNIT/ML FlexPen Inject 5 Units into the skin 3 (three) times daily with meals. 04/09/19  Yes Cannady, Jolene T, NP  Insulin Detemir (LEVEMIR) 100 UNIT/ML Pen Inject 10 Units into the skin daily. 04/09/19  Yes Cannady, Jolene T, NP      VITAL SIGNS:  Blood pressure 133/73, pulse (!) 118, temperature 98.1 F (36.7 C), temperature source Oral, resp. rate 16, SpO2 99 %.  PHYSICAL EXAMINATION:  Physical Exam  GENERAL:  58 y.o.-year-old patient lying in the bed with no acute distress.  EYES: Pupils equal, round, reactive  to light and accommodation. No scleral icterus. Extraocular muscles intact.  HEENT: Head atraumatic, normocephalic. Oropharynx and nasopharynx clear.  NECK:  Supple, no jugular venous distention. No thyroid enlargement, no tenderness.  LUNGS: Normal breath sounds bilaterally, no wheezing, rales,rhonchi or crepitation. No use of accessory muscles of respiration.  CARDIOVASCULAR: Regular rate and rhythm, S1, S2 normal. No murmurs, rubs, or gallops.  ABDOMEN: Soft, nondistended, nontender. Bowel sounds present. No organomegaly or mass.  EXTREMITIES: No pedal edema, cyanosis, or clubbing.  NEUROLOGIC: Cranial nerves II through XII are intact.Sensation intact. Gait not checked.  PSYCHIATRIC: The patient is alert and nonverbal.  Refuses to make eye contact SKIN: No obvious rash, lesion, or ulcer.   LABORATORY PANEL:   CBC Recent Labs  Lab 06/16/19 1606  WBC 7.4  HGB 15.5*  HCT 43.7  PLT 461*   ------------------------------------------------------------------------------------------------------------------  Chemistries  Recent Labs  Lab 06/16/19 1606  NA 138  K 3.7  CL 101  CO2 25  GLUCOSE 64*  BUN 23*  CREATININE 0.85  CALCIUM 10.2  AST 28  ALT 25  ALKPHOS 73  BILITOT 1.3*   ------------------------------------------------------------------------------------------------------------------  Cardiac Enzymes No results for input(s): TROPONINI in the last 168 hours. ------------------------------------------------------------------------------------------------------------------  RADIOLOGY:  No results found.    IMPRESSION AND PLAN:   1.  Urinary tract infection - IV Rocephin - Will review urine culture results when available and adjust treatment as indicated  2.  Failure to thrive - D5LR infusing to peripheral IV at 75 cc/h - We will continue to encourage oral intake - Dietitian consulted for further evaluation and recommendations  3.  Diabetes mellitus -  Uncontrolled with episodes of hyperglycemia and hypoglycemia - Most recent glucose is 64.  She is refusing oral intake.  D5 LR is infusing at 75 cc/h - Sliding scale insulin - Every 6 glucometer monitoring  4.  Dementia - Patient has been evaluated by psychiatry and cleared according to documentation - Awaiting social service placement evaluation on Monday, 06/18/2019 -Continue Aricept  5.  History of depression - Continue Cymbalta - Repeat TSH  CBC and BMP in the a.m. - DVT and PPI prophylaxis    All the records are reviewed and case discussed with ED provider. The plan of care was discussed in details with the patient (and family). I answered all questions. The patient agreed to proceed with the above mentioned plan. Further management will depend upon hospital course.   CODE STATUS: Full code  TOTAL TIME TAKING CARE OF THIS PATIENT: 45 minutes.    [place] on 06/16/2019 at 5:46 PM  Pager - [phone]  After 6pm go to [email or web address] - Proofreader  [facility]  Office  [phone]  CC: Primary care physician; Venita Lick, NP   Note: This dictation was prepared with Dragon dictation along with smaller phrase technology. Any transcriptional errors  that result from this process are unintentional.

## 2019-06-16 NOTE — ED Notes (Signed)
Sula Soda and [place] RN changed and pt brief  Pt took a couple sips of ensure protein shake  Lm edt

## 2019-06-16 NOTE — ED Notes (Signed)
Entered room because patient bed alarm going off.Pt pull Pt was at the end of the bed trying to get up. Bed and patient's brief very wet. Pt very confused and would not listen to commands with trying to get her cleaned Called charge to assist with getting her cleaned, dry and in bed.AS

## 2019-06-16 NOTE — ED Notes (Signed)
To room for morning insulin and breakfast. Pt nonverbal with RN, only stating "un-uh" with all interventions.  VSS, pt given insulin per orders.  Pt dinner tray from last night on bedside table untouched.  Pt left fresh breakfast tray, but does not seem interested in eating this AM either.  Will continue to monitor pt.

## 2019-06-16 NOTE — ED Notes (Signed)
In to room to check CBG and give lunch.  Pt remains in hospital bed, lying on her right side.  Pt does not want RN to check BS and attempts to stop RN from doing so.  CBG checked at this time, pt continues to refuse food.  When this RN asks pt why she is being difficult today, pt answers "Cuz ya'll are up to something."

## 2019-06-16 NOTE — ED Notes (Signed)
ED TO INPATIENT HANDOFF REPORT  ED Nurse Name and Phone #: Victorino DikeJennifer [phone]  S Name/Age/Gender Bethany James 58 y.o. female Room/Bed: ED26A/ED26A  Code Status   Code Status: Prior  Home/SNF/Other Skilled nursing facility Patient oriented to: self Is this baseline? Yes   Triage Complete: Triage complete  Chief Complaint abd pain ems  Triage Note Patient presents to ED via [facility] from home. Patient lives with her daughter. Patient told EMS she was having 7/10 abdominal pain. History of gastric ulcers that requires surgical intervention. On arrival patient denies abdominal pain. Denies any complaints. Family report history of dementia. When asked why patient is here she states "to find out where they are going to place me". Patient A&O x3, disoriented to situation.    Allergies No Known Allergies  Level of Care/Admitting Diagnosis ED Disposition    ED Disposition Condition Comment   Admit  Hospital Area: [facility] [[id number]]  Level of Care: Med-Surg [16]  Covid Evaluation: Confirmed COVID Negative  Diagnosis: UTI (urinary tract infection) [119147][[id number]]  Admitting Physician: Pearletha AlfredSEALS, ANGELA H [[id number]]  Attending Physician: Pearletha AlfredSEALS, ANGELA H [[id number]]  Estimated length of stay: past midnight tomorrow  Certification:: I certify this patient will need inpatient services for at least 2 midnights  PT Class (Do Not Modify): Inpatient [101]  PT Acc Code (Do Not Modify): Private [1]       B Medical/Surgery History Past Medical History:  Diagnosis Date  . Anemia   . Dementia (HCC)   . Diabetes mellitus without complication [facility](HCC)    Past Surgical History:  Procedure Laterality Date  . ABDOMINAL HYSTERECTOMY    . CHOLECYSTECTOMY    . ESOPHAGOGASTRODUODENOSCOPY N/A 02/21/2019   Procedure: ESOPHAGOGASTRODUODENOSCOPY (EGD);  Surgeon: Toledo, Boykin Nearingeodoro K, MD;  Location: [facility] ENDOSCOPY;  Service: Gastroenterology;  Laterality: N/A;  ICU  . ESOPHAGOGASTRODUODENOSCOPY  (EGD) WITH PROPOFOL N/A 02/24/2019   Procedure: ESOPHAGOGASTRODUODENOSCOPY (EGD) WITH PROPOFOL;  Surgeon: Toledo, Boykin Nearingeodoro K, MD;  Location: [facility] ENDOSCOPY;  Service: Gastroenterology;  Laterality: N/A;  . stomach ulcers removal       A IV Location/Drains/Wounds Patient Lines/Drains/Airways Status   Active Line/Drains/Airways    Name:   Placement date:   Placement time:   Site:   Days:   Peripheral IV 06/16/19 Right Forearm   06/16/19    1601    Forearm   less than 1          Intake/Output Last 24 hours No intake or output data in the 24 hours ending 06/16/19 1825  Labs/Imaging Results for orders placed or performed during the hospital encounter of 06/12/19 (from the past 48 hour(s))  Glucose, capillary     Status: Abnormal   Collection Time: 06/14/19  9:22 PM  Result Value Ref Range   Glucose-Capillary 164 (H) 70 - 99 mg/dL  Glucose, capillary     Status: Abnormal   Collection Time: 06/15/19  7:24 AM  Result Value Ref Range   Glucose-Capillary 278 (H) 70 - 99 mg/dL  Glucose, capillary     Status: Abnormal   Collection Time: 06/15/19 12:04 PM  Result Value Ref Range   Glucose-Capillary 138 (H) 70 - 99 mg/dL  Glucose, capillary     Status: Abnormal   Collection Time: 06/15/19  9:58 PM  Result Value Ref Range   Glucose-Capillary 282 (H) 70 - 99 mg/dL   Comment 1 Document in Chart   Glucose, capillary     Status: Abnormal   Collection Time: 06/16/19  8:24 AM  Result Value Ref Range   Glucose-Capillary 290 (H) 70 - 99 mg/dL  Glucose, capillary     Status: Abnormal   Collection Time: 06/16/19 12:08 PM  Result Value Ref Range   Glucose-Capillary 133 (H) 70 - 99 mg/dL  CBC with Differential/Platelet     Status: Abnormal   Collection Time: 06/16/19  4:06 PM  Result Value Ref Range   WBC 7.4 4.0 - 10.5 K/uL   RBC 5.29 (H) 3.87 - 5.11 MIL/uL   Hemoglobin 15.5 (H) 12.0 - 15.0 g/dL   HCT 78.243.7 95.636.0 - 21.346.0 %   MCV 82.6 80.0 - 100.0 fL   MCH 29.3 26.0 - 34.0 pg   MCHC 35.5 30.0  - 36.0 g/dL   RDW 08.611.9 57.811.5 - 46.915.5 %   Platelets 461 (H) 150 - 400 K/uL   nRBC 0.0 0.0 - 0.2 %   Neutrophils Relative % 62 %   Neutro Abs 4.6 1.7 - 7.7 K/uL   Lymphocytes Relative 28 %   Lymphs Abs 2.0 0.7 - 4.0 K/uL   Monocytes Relative 9 %   Monocytes Absolute 0.6 0.1 - 1.0 K/uL   Eosinophils Relative 1 %   Eosinophils Absolute 0.1 0.0 - 0.5 K/uL   Basophils Relative 0 %   Basophils Absolute 0.0 0.0 - 0.1 K/uL   Immature Granulocytes 0 %   Abs Immature Granulocytes 0.01 0.00 - 0.07 K/uL    Comment: Performed at [facility], [address] [place], [place] [address]  Comprehensive metabolic panel     Status: Abnormal   Collection Time: 06/16/19  4:06 PM  Result Value Ref Range   Sodium 138 135 - 145 mmol/L   Potassium 3.7 3.5 - 5.1 mmol/L   Chloride 101 98 - 111 mmol/L   CO2 25 22 - 32 mmol/L   Glucose, Bld 64 (L) 70 - 99 mg/dL   BUN 23 (H) 6 - 20 mg/dL   Creatinine, Ser 8.410.85 0.44 - 1.00 mg/dL   Calcium 32.410.2 8.9 - 40.110.3 mg/dL   Total Protein 8.5 (H) 6.5 - 8.1 g/dL   Albumin 4.5 3.5 - 5.0 g/dL   AST 28 15 - 41 U/L   ALT 25 0 - 44 U/L   Alkaline Phosphatase 73 38 - 126 U/L   Total Bilirubin 1.3 (H) 0.3 - 1.2 mg/dL   GFR calc non Af Amer >60 >60 mL/min   GFR calc Af Amer >60 >60 mL/min   Anion gap 12 5 - 15    Comment: Performed at [facility], [address] [place], [place] [address]  Urinalysis, Complete w Microscopic     Status: Abnormal   Collection Time: 06/16/19  4:06 PM  Result Value Ref Range   Color, Urine YELLOW (A) YELLOW   APPearance HAZY (A) CLEAR   Specific Gravity, Urine 1.018 1.005 - 1.030   pH 5.0 5.0 - 8.0   Glucose, UA >=500 (A) NEGATIVE mg/dL   Hgb urine dipstick NEGATIVE NEGATIVE   Bilirubin Urine NEGATIVE NEGATIVE   Ketones, ur 20 (A) NEGATIVE mg/dL   Protein, ur 30 (A) NEGATIVE mg/dL   Nitrite NEGATIVE NEGATIVE   Leukocytes,Ua SMALL (A) NEGATIVE   WBC, UA >50 (H) 0 - 5 WBC/hpf   Bacteria, UA MANY (A) NONE SEEN    Squamous Epithelial / LPF NONE SEEN 0 - 5    Comment: Performed at [facility], [address] [place], [place] [address]  Glucose, capillary     Status:  Abnormal   Collection Time: 06/16/19  6:21 PM  Result Value Ref Range   Glucose-Capillary 362 (H) 70 - 99 mg/dL   No results found.  Pending Labs Unresulted Labs (From admission, onward)    Start     Ordered   06/17/19 0500  TSH  Tomorrow morning,   STAT     06/16/19 1816   06/16/19 1640  Urine Culture  ONCE - STAT,   STAT     06/16/19 1639   06/12/19 1104  Urinalysis, Complete w Microscopic  Once,   STAT     06/12/19 1103   Signed and Held  CBC  (enoxaparin (LOVENOX)    CrCl >/= 30 ml/min)  Once,   R    Comments: Baseline for enoxaparin therapy IF NOT ALREADY DRAWN.  Notify MD if PLT < 100 K.    Signed and Held   Signed and Held  Creatinine, serum  (enoxaparin (LOVENOX)    CrCl >/= 30 ml/min)  Once,   R    Comments: Baseline for enoxaparin therapy IF NOT ALREADY DRAWN.    Signed and Held   Signed and Held  Creatinine, serum  (enoxaparin (LOVENOX)    CrCl >/= 30 ml/min)  Weekly,   R    Comments: while on enoxaparin therapy    Signed and Held   Signed and Held  Urine culture  Once,   R    Question:  Patient immune status  Answer:  Normal   Signed and Held   Signed and Held  Basic metabolic panel  Tomorrow morning,   R     Signed and Held   Signed and Held  CBC  Tomorrow morning,   R     Signed and Held          Vitals/Pain Today's Vitals   06/16/19 0401 06/16/19 0507 06/16/19 0838 06/16/19 1604  BP:   113/63 133/73  Pulse:   (!) 111 (!) 118  Resp:   18 16  Temp:      TempSrc:      SpO2:   99% 99%  PainSc: Asleep Asleep      Isolation Precautions No active isolations  Medications Medications  feeding supplement (ENSURE ENLIVE) (ENSURE ENLIVE) liquid 237 mL (237 mLs Oral Refused 06/16/19 1058)  donepezil (ARICEPT) tablet 5 mg (0 mg Oral Hold 06/16/19 1058)  DULoxetine (CYMBALTA) DR capsule 60 mg (0  mg Oral Hold 06/16/19 1059)  insulin detemir (LEVEMIR) injection 10 Units (10 Units Subcutaneous Not Given 06/16/19 1058)  insulin aspart (novoLOG) injection 5 Units (5 Units Subcutaneous Not Given 06/16/19 1633)  dextrose 5 % in lactated ringers infusion (has no administration in time range)  insulin aspart (novoLOG) injection 0-5 Units (has no administration in time range)  insulin aspart (novoLOG) injection 0-9 Units (has no administration in time range)  cefTRIAXone (ROCEPHIN) 1 g in sodium chloride 0.9 % 100 mL IVPB (has no administration in time range)  pantoprazole (PROTONIX) injection 40 mg (has no administration in time range)  LORazepam (ATIVAN) injection 1 mg (1 mg Intravenous Given 06/12/19 1738)  LORazepam (ATIVAN) injection 0.5 mg (0.5 mg Intravenous Given 06/13/19 0734)  insulin aspart (novoLOG) injection 5 Units (5 Units Subcutaneous Given 06/13/19 0905)  sodium chloride 0.9 % bolus 500 mL (0 mLs Intravenous Stopped 06/13/19 1150)  insulin aspart (novoLOG) injection 10 Units (10 Units Intravenous Given 06/13/19 1044)  dextrose 50 % solution 25 mL (25 mLs Intravenous Given 06/14/19 1652)  hydrOXYzine (ATARAX/VISTARIL) tablet  25 mg (25 mg Oral Given 06/15/19 0120)  0.9 %  sodium chloride infusion ( Intravenous Stopped 06/16/19 1640)  dextrose 5% lactated ringers bolus 1,000 mL (1,000 mLs Intravenous New Bag/Given 06/16/19 1640)  cefTRIAXone (ROCEPHIN) 1 g in sodium chloride 0.9 % 100 mL IVPB (0 g Intravenous Stopped 06/16/19 1716)    Mobility non-ambulatory High fall risk   Focused Assessments Cardiac Assessment Handoff:  Cardiac Rhythm: Normal sinus rhythm Lab Results  Component Value Date   TROPONINI 0.42 (HH) 02/22/2019   No results found for: DDIMER Does the Patient currently have chest pain? No     R Recommendations: See Admitting Provider Note  Report given to:   Additional Notes: Pt is ambulatory with 1 assist at baseline. Has been nonambulatory last 2 days

## 2019-06-16 NOTE — Social Work (Addendum)
TOC CM/SW   1102 SW received call from [place] weekend Education officer, museum. The patient's case was assigned 8/14 and an APS social worker will perform a virtual evaluation on Monday, 06/18/2019. [place]   CM/SW should call APS at 1200 on Monday to get an update if virtual visit is not competed.   52  SW called [facility] to get update on the patient's case.  Chart reviewed and will update accordingly.      Berenice Bouton, MSW, LCSW  ED Social Worker [phone] 8am-6pm (weekends) or CSW ED # [phone]

## 2019-06-16 NOTE — ED Notes (Signed)
PT resting quietly on hospital bed, NAD noted at this time.  Pt continues to be less interactive with staff and had not eaten today.

## 2019-06-16 NOTE — ED Provider Notes (Signed)
-----------------------------------------   6:59 AM on 06/16/2019 -----------------------------------------   Blood pressure (!) 154/98, pulse 97, temperature 98.1 F (36.7 C), temperature source Oral, resp. rate 18, SpO2 99 %.  The patient is calm and cooperative at this time.  There have been no acute events since the last update.  Awaiting placement.   Arta Silence, MD 06/16/19 [phone]

## 2019-06-16 NOTE — ED Notes (Signed)
Report received, care of pt assumed.  Pt resting quietly on hospital bed, eyes closed, resp even and non labored.  Will monitor.

## 2019-06-16 NOTE — ED Notes (Signed)
Attempted to call daughter to update on admission.  No answer, left message to alert daughter that pt would be admitted, giving room information and letting her know she could call the floor, or talk with CSW tomorrow.

## 2019-06-16 NOTE — ED Provider Notes (Signed)
Patient appears to be getting worse, does have evidence of UTI.  We have given her D5 LR for hypoglycemia and IV Rocephin for her UTI.  I will discuss with the hospitalist for admission.   Earleen Newport, MD 06/16/19 1729

## 2019-06-17 ENCOUNTER — Other Ambulatory Visit: Payer: Self-pay

## 2019-06-17 LAB — GLUCOSE, CAPILLARY
Glucose-Capillary: 106 mg/dL — ABNORMAL HIGH (ref 70–99)
Glucose-Capillary: 123 mg/dL — ABNORMAL HIGH (ref 70–99)
Glucose-Capillary: 147 mg/dL — ABNORMAL HIGH (ref 70–99)
Glucose-Capillary: 187 mg/dL — ABNORMAL HIGH (ref 70–99)
Glucose-Capillary: 305 mg/dL — ABNORMAL HIGH (ref 70–99)
Glucose-Capillary: 72 mg/dL (ref 70–99)

## 2019-06-17 LAB — CBC
HCT: 39.6 % (ref 36.0–46.0)
Hemoglobin: 13.6 g/dL (ref 12.0–15.0)
MCH: 29 pg (ref 26.0–34.0)
MCHC: 34.3 g/dL (ref 30.0–36.0)
MCV: 84.4 fL (ref 80.0–100.0)
Platelets: 335 10*3/uL (ref 150–400)
RBC: 4.69 MIL/uL (ref 3.87–5.11)
RDW: 12.1 % (ref 11.5–15.5)
WBC: 5.2 10*3/uL (ref 4.0–10.5)
nRBC: 0 % (ref 0.0–0.2)

## 2019-06-17 LAB — BASIC METABOLIC PANEL
Anion gap: 8 (ref 5–15)
BUN: 13 mg/dL (ref 6–20)
CO2: 24 mmol/L (ref 22–32)
Calcium: 9.5 mg/dL (ref 8.9–10.3)
Chloride: 103 mmol/L (ref 98–111)
Creatinine, Ser: 0.81 mg/dL (ref 0.44–1.00)
GFR calc Af Amer: >60 mL/min (ref >60)
GFR calc non Af Amer: >60 mL/min (ref >60)
Glucose, Bld: 315 mg/dL — ABNORMAL HIGH (ref 70–99)
Potassium: 4 mmol/L (ref 3.5–5.1)
Sodium: 135 mmol/L (ref 135–145)

## 2019-06-17 LAB — TSH: TSH: 7.395 u[IU]/mL — ABNORMAL HIGH (ref 0.350–4.500)

## 2019-06-17 MED ORDER — GLUCERNA SHAKE PO LIQD
237.0000 mL | Freq: Three times a day (TID) | ORAL | Status: DC
  Administered 2019-06-17 – 2019-06-20 (×10): 237 mL via ORAL

## 2019-06-17 MED ORDER — PANTOPRAZOLE SODIUM 40 MG PO TBEC
40.0000 mg | DELAYED_RELEASE_TABLET | Freq: Every day | ORAL | Status: DC
  Administered 2019-06-18 – 2019-06-21 (×4): 40 mg via ORAL
  Filled 2019-06-17 (×4): qty 1

## 2019-06-17 NOTE — Progress Notes (Signed)
Initial Nutrition Assessment  DOCUMENTATION CODES:   Not applicable  INTERVENTION:  Recommend liberalizing diet to carbohydrate modified.  Will change Ensure Enlive to Glucerna Shake po TID, each supplement provides 220 kcal and 10 grams of protein.  Continue daily MVI.  NUTRITION DIAGNOSIS:   Inadequate oral intake related to decreased appetite(confusion) as evidenced by meal completion < 50%.  GOAL:   Patient will meet greater than or equal to 90% of their needs  MONITOR:   PO intake, Supplement acceptance, Labs, Weight trends, I & O's  REASON FOR ASSESSMENT:   Consult Assessment of nutrition requirement/status, Poor PO  ASSESSMENT:   58 year old female with PMHx of dementia, DM, anemia admitted with UTI, FTT.   Met with patient at bedside. She was very confused and seemed scared. She was nervously picking at her finger tips. Unable to provide any history. No meal documentation recorded in chart at this time. Breakfast tray was still at bedside. It appears she ate her muffin, bites of eggs, and sips of Ensure.  Per chart patient was 71.2 kg on 04/06/2018 and weight has been trending down. She was 59 kg on 08/13/2018. RD obtained bed scale weight today of 44.8 kg (98.77 lbs). She has lost 14.2 kg (24% body weight) over the past 10 months, which is significant for time frame.  Medications reviewed and include: Novolog 0-9 units TID, Novolog 0-5 units QHS, Novolog 5 units TID, Levemir 10 units daily, MVI daily, pantoprazole, ceftriaxone, D5 in LR at 75 mL/hr.  Labs reviewed: CBG 187-362.  Patient is at risk for malnutrition.  NUTRITION - FOCUSED PHYSICAL EXAM:  Unable to complete at this time as patient did not seem to understand when RD was asking permission to do exam. Do not want to frighten patient.  Diet Order:   Diet Order            Diet heart healthy/carb modified Room service appropriate? Yes; Fluid consistency: Thin  Diet effective now              EDUCATION NEEDS:   Not appropriate for education at this time  Skin:  Skin Assessment: Reviewed RN Assessment  Last BM:  06/13/2019 - small type 1  Height:   Ht Readings from Last 1 Encounters:  06/05/19 4' 10"  (1.473 m)   Weight:  Wt Readings from Last 1 Encounters:  06/17/19 44.8 kg   Ideal Body Weight:  43.9 kg  BMI:  Body mass index is 20.64 kg/m.  Estimated Nutritional Needs:   Kcal:  1200-1400  Protein:  60-70 grams  Fluid:  1.2-1.4 L/day  Willey Blade, MS, RD, LDN Office: [phone] Pager: [phone] After Hours/Weekend Pager: [phone]

## 2019-06-17 NOTE — Progress Notes (Signed)
[facility] [place] at [facility]   PATIENT NAME: Bethany James    MR#:  [id number]  DATE OF BIRTH:  08-18-1961  SUBJECTIVE:   Patient was in the ER for several days however she continues to decline found to have UTI admitted now on the hospitalist service. She does not talk much answered a few questions. She is a dementia. Per report daughter not able to care for her.  Placement process has been started by ER Child psychotherapistsocial worker. REVIEW OF SYSTEMS:   Review of Systems  Unable to perform ROS: Dementia   Tolerating Diet: some Tolerating PT:   DRUG ALLERGIES:  No Known Allergies  VITALS:  Blood pressure (!) 141/97, pulse 98, temperature 98.7 F (37.1 C), temperature source Axillary, resp. rate 17, weight 44.8 kg, SpO2 100 %.  PHYSICAL EXAMINATION:   Physical Exam  GENERAL:  58 y.o.-year-old patient lying in the bed with no acute distress.  EYES: Pupils equal, round, reactive to light and accommodation. No scleral icterus. Extraocular muscles intact.  HEENT: Head atraumatic, normocephalic. Oropharynx and nasopharynx clear.  NECK:  Supple, no jugular venous distention. No thyroid enlargement, no tenderness.  LUNGS: Normal breath sounds bilaterally, no wheezing, rales, rhonchi. No use of accessory muscles of respiration.  CARDIOVASCULAR: S1, S2 normal. No murmurs, rubs, or gallops.  ABDOMEN: Soft, nontender, nondistended. Bowel sounds present. No organomegaly or mass.  EXTREMITIES: No cyanosis, clubbing or edema b/l.    NEUROLOGIC: Cranial nerves II through XII are intact. No focal Motor or sensory deficits b/l. Moves all extremities well  PSYCHIATRIC:  patient is alert and dementia at baseline SKIN: No obvious rash, lesion, or ulcer.   LABORATORY PANEL:  CBC Recent Labs  Lab 06/17/19 0526  WBC 5.2  HGB 13.6  HCT 39.6  PLT 335    Chemistries  Recent Labs  Lab 06/16/19 1606 06/17/19 0526  NA 138 135  K 3.7 4.0  CL 101 103  CO2 25 24  GLUCOSE  64* 315*  BUN 23* 13  CREATININE 0.85 0.81  CALCIUM 10.2 9.5  AST 28  --   ALT 25  --   ALKPHOS 73  --   BILITOT 1.3*  --    Cardiac Enzymes No results for input(s): TROPONINI in the last 168 hours. RADIOLOGY:  No results found. ASSESSMENT AND PLAN:   Bethany OdeaJamica Derousse  is a 58 y.o. female with a known history of dementia, diabetes mellitus.  She was originally brought to the hospital from home where she lives with her daughter by EMS services on 06/12/2019 with patient's daughter reporting frequent falls and that she is no longer capable of taking care of the patient who is her mother  1. Urinary tract infection - IV Rocephin - Will review urine culture results when available and adjust treatment as indicated  2.  Failure to thrive - D5LR infusing to peripheral IV at 75 cc/h-- ogres trending high discontinue IV fluids - We will continue to encourage oral intake - Dietitian consulted for further evaluation and recommendations  3.  Diabetes mellitus - Uncontrolled with episodes of hyperglycemia and hypoglycemia .- Sliding scale insulin - Every 6 glucometer monitoring  4.  Dementia - Patient has been evaluated by psychiatry and cleared according to documentation - Awaiting social service placement evaluation on Monday, 06/18/2019 -Continue Aricept  5.  History of depression - Continue Cymbalta   - DVT and PPI prophylaxis   Social worker for discharge planning  CODE STATUS: full  DVT  Prophylaxis: Lovenox  TOTAL TIME TAKING CARE OF THIS PATIENT: *25* minutes.  >50% time spent on counselling and coordination of care  POSSIBLE D/C IN *2 to 3** DAYS, DEPENDING ON CLINICAL CONDITION.  Note: This dictation was prepared with Dragon dictation along with smaller phrase technology. Any transcriptional errors that result from this process are unintentional.  Fritzi Mandes M.D on 06/17/2019 at 2:02 PM  Between 7am to 6pm - Pager - [phone]  After 6pm go to [email or web address] -  password [email or web address] [facility]  Office  [phone]  CC: Primary care physician; Venita Lick, NPPatient ID: [name], female   DOB: 1961/02/27, 57 y.o.   MRN: [id number]

## 2019-06-17 NOTE — Progress Notes (Signed)
Patient is very confused and scared.  She does not want any scans.  She thinks the scanning harms her.  She refused her medications at first then was able to swallow them.  She did not want her insulin but her blood sugar was over 300.  RN distracted her and then gave her insulin.  Patient sometimes can speak clearly - she stated she did not need a blanket when asked if she was cold.  Patient stated she was not hungry and could not eat all her food.  She said she would eat more later.  She was able to eat her muffin and a few bites of eggs.  Patient drank a vanilla ensure with her medications.  Patient believes the medicines stink.  She was chewing them.  RN reminded her to swallow and not chew; she replied and said that she was not chewing but Rn could clearly see her chewing the capsules in her mouth.  Patient was able to swallow her medicines with her protein shake (Ensure).  Phillis Knack, RN

## 2019-06-17 NOTE — Evaluation (Signed)
Physical Therapy Evaluation Patient Details Name: Bethany James MRN: [id number] DOB: July 19, 1961 Today's Date: 06/17/2019   History of Present Illness  Pt admitted for UTI. APS involved and trying to find placement. History includes dementia and DM. Has had decline in functional status during hospital admission. PT eval ordered this date.  Clinical Impression  Pt is a pleasant 58 year old female who was admitted for UTI. Pt performs bed mobility, transfers, and ambulation with cga and RW. Able to follow simple commands, unable to perform multi-tasking including formalized there-ex program. Pt demonstrates deficits with cognition/strength/mobility. Able to ambulate in room with RW. Would benefit from skilled PT to address above deficits and promote optimal return to PLOF. Recommend transition to [place] upon discharge from acute hospitalization. May need LTC if unable to dc home.     Follow Up Recommendations Home health PT;Supervision/Assistance - 24 hour(vs LTC)    Equipment Recommendations  Rolling walker with 5" wheels    Recommendations for Other Services       Precautions / Restrictions Precautions Precautions: Fall Restrictions Weight Bearing Restrictions: No      Mobility  Bed Mobility Overal bed mobility: Needs Assistance Bed Mobility: Supine to Sit     Supine to sit: Min guard     General bed mobility comments: safe technique, follows commands well  Transfers Overall transfer level: Needs assistance Equipment used: Rolling walker (2 wheeled) Transfers: Sit to/from Stand Sit to Stand: Min guard         General transfer comment: needs cues for hand placement. Once standing pt with post leaning, able to self correct with min assist.  Ambulation/Gait Ambulation/Gait assistance: Min guard Gait Distance (Feet): 40 Feet Assistive device: Rolling walker (2 wheeled) Gait Pattern/deviations: Step-through pattern     General Gait Details: able to ambulated in room  with RW. Unsteady with cues for obstacle avoidance. Fatigues  Financial trader Rankin (Stroke Patients Only)       Balance Overall balance assessment: Needs assistance Sitting-balance support: No upper extremity supported Sitting balance-Leahy Scale: Good     Standing balance support: Bilateral upper extremity supported Standing balance-Leahy Scale: Fair Standing balance comment: post leaning during standing                             Pertinent Vitals/Pain Pain Assessment: No/denies pain    Home Living Family/patient expects to be discharged to:: Private residence Living Arrangements: Children(daughter) Available Help at Discharge: Family Type of Home: Mobile home Home Access: Level entry     Home Layout: One level Home Equipment: Environmental consultant - 2 wheels Additional Comments: pt confused, poor historian    Prior Function Level of Independence: Independent with assistive device(s);Needs assistance         Comments: Reports she uses RW for mobility     Hand Dominance        Extremity/Trunk Assessment   Upper Extremity Assessment Upper Extremity Assessment: Generalized weakness( B LE grossly 4/5)    Lower Extremity Assessment Lower Extremity Assessment: Generalized weakness(B LE grossly 4/5)       Communication   Communication: No difficulties  Cognition Arousal/Alertness: Awake/alert Behavior During Therapy: WFL for tasks assessed/performed Overall Cognitive Status: No family/caregiver present to determine baseline cognitive functioning  General Comments      Exercises Other Exercises Other Exercises: able to follow commands for moving all extremities but not in formal ther-ex.   Assessment/Plan    PT Assessment Patient needs continued PT services  PT Problem List Decreased strength;Decreased activity tolerance;Decreased balance;Decreased  mobility;Decreased cognition;Decreased safety awareness       PT Treatment Interventions Gait training;Therapeutic exercise;Balance training    PT Goals (Current goals can be found in the Care Plan section)  Acute Rehab PT Goals Patient Stated Goal: unable to state PT Goal Formulation: Patient unable to participate in goal setting Time For Goal Achievement: 07/01/19 Potential to Achieve Goals: Good    Frequency Min 2X/week   Barriers to discharge        Co-evaluation               AM-PAC PT "6 Clicks" Mobility  Outcome Measure Help needed turning from your back to your side while in a flat bed without using bedrails?: None Help needed moving from lying on your back to sitting on the side of a flat bed without using bedrails?: A Little Help needed moving to and from a bed to a chair (including a wheelchair)?: A Little Help needed standing up from a chair using your arms (e.g., wheelchair or bedside chair)?: A Little Help needed to walk in hospital room?: A Little Help needed climbing 3-5 steps with a railing? : A Little 6 Click Score: 19    End of Session Equipment Utilized During Treatment: Gait belt Activity Tolerance: Patient tolerated treatment well Patient left: in bed;with bed alarm set Nurse Communication: Mobility status PT Visit Diagnosis: Unsteadiness on feet (R26.81);Muscle weakness (generalized) (M62.81);Difficulty in walking, not elsewhere classified (R26.2)    Time: 1537-1600 PT Time Calculation (min) (ACUTE ONLY): 23 min   Charges:   PT Evaluation $PT Eval Low Complexity: 1 Low          Bethany PalauStephanie Azzam James, PT, DPT [phone]   Bethany James 06/17/2019, 4:35 PM

## 2019-06-17 NOTE — Progress Notes (Signed)
PT Cancellation Note  Patient Details Name: Bethany James MRN: [id number] DOB: [birth year]   Cancelled Treatment:    Reason Eval/Treat Not Completed: Other (comment). Pt currently receiving bath, not available for PT evaluation. Will re-attempt next available time.   Piers Baade 06/17/2019, 2:23 PM Greggory Stallion, PT, DPT [phone]

## 2019-06-17 NOTE — Progress Notes (Signed)
Consult placed to dietitian

## 2019-06-17 NOTE — Progress Notes (Signed)
PHARMACIST - PHYSICIAN COMMUNICATION  CONCERNING: IV to Oral Route Change Policy  RECOMMENDATION: This patient is receiving pantoprazole by the intravenous route.  Based on criteria approved by the Pharmacy and Therapeutics Committee, the intravenous medication(s) is/are being converted to the equivalent oral dose form(s).   DESCRIPTION: These criteria include:  The patient is eating (either orally or via tube) and/or has been taking other orally administered medications for a least 24 hours  The patient has no evidence of active gastrointestinal bleeding or impaired GI absorption (gastrectomy, short bowel, patient on TNA or NPO).  If you have questions about this conversion, please contact the Pharmacy Department at [phone].    Simpson,Michael L, [facility] 06/17/2019 10:40 AM

## 2019-06-17 NOTE — Plan of Care (Signed)
  Problem: Health Behavior/Discharge Planning: Goal: Ability to manage health-related needs will improve Outcome: Progressing   

## 2019-06-18 DIAGNOSIS — Z515 Encounter for palliative care: Secondary | ICD-10-CM

## 2019-06-18 DIAGNOSIS — R627 Adult failure to thrive: Secondary | ICD-10-CM

## 2019-06-18 LAB — GLUCOSE, CAPILLARY
Glucose-Capillary: 102 mg/dL — ABNORMAL HIGH (ref 70–99)
Glucose-Capillary: 51 mg/dL — ABNORMAL LOW (ref 70–99)
Glucose-Capillary: 195 mg/dL — ABNORMAL HIGH (ref 70–99)
Glucose-Capillary: 227 mg/dL — ABNORMAL HIGH (ref 70–99)
Glucose-Capillary: 344 mg/dL — ABNORMAL HIGH (ref 70–99)
Glucose-Capillary: 41 mg/dL — CL (ref 70–99)
Glucose-Capillary: 182 mg/dL — ABNORMAL HIGH (ref 70–99)
Glucose-Capillary: 207 mg/dL — ABNORMAL HIGH (ref 70–99)

## 2019-06-18 LAB — T4, FREE: Free T4: 0.81 ng/dL (ref 0.61–1.12)

## 2019-06-18 MED ORDER — DEXTROSE 50 % IV SOLN: 25.0000 g | INTRAVENOUS | Status: DC

## 2019-06-18 MED ORDER — ENOXAPARIN SODIUM 30 MG/0.3ML ~~LOC~~ SOLN
30.0000 mg | SUBCUTANEOUS | Status: DC
  Administered 2019-06-18 – 2019-06-20 (×3): 30 mg via SUBCUTANEOUS
  Filled 2019-06-18 (×3): qty 0.3

## 2019-06-18 MED ORDER — DEXTROSE 50 % IV SOLN
INTRAVENOUS | Status: AC
  Filled 2019-06-18: qty 50

## 2019-06-18 MED ORDER — DEXTROSE IN LACTATED RINGERS 5 % IV SOLN
INTRAVENOUS | Status: DC
  Administered 2019-06-18: 07:00:00 via INTRAVENOUS

## 2019-06-18 NOTE — TOC Progression Note (Signed)
Transition of Care [facility]) - Progression Note    Patient Details  Name: Bethany James MRN: [id number] Date of Birth: 11/03/1960  Transition of Care [facility]) CM/SW Contact  Shelbie Hutching, RN Phone Number: 06/18/2019, 10:08 AM  Clinical Narrative:    APS worker Abigail completed evaluation on Friday.  [facility] and will [facility]     Expected Discharge Plan: Memory Care Barriers to Discharge: SNF Pending bed offer  Expected Discharge Plan and Services Expected Discharge Plan: Memory Care   Discharge Planning Services: CM Consult                                           Social Determinants of Health (SDOH) Interventions    Readmission Risk Interventions No flowsheet data found.

## 2019-06-18 NOTE — Consult Note (Signed)
Palliative Medicine [facility]  [phone] [phone]   Name: Bethany James Date: 06/18/2019 MRN: [id number]  DOB: 1961/06/12  Patient Care Team: Marjie Skiffannady, Jolene T, NP as PCP - General (Nurse Practitioner) Lourena Simmondsravis, Catherine E, [facility], LLCRPH as Pharmacist (Pharmacist) Minor, Theadora RamaJanci S, RN as Case Manager    REASON FOR CONSULTATION: Palliative Care consult requested for this 58 y.o. female with multiple medical problems including dementia, diabetes, who was originally brought to the ER from home on 06/12/2019 with report of frequent falls and an inability of family to care for her in the home.  Patient spent several days in the ER waiting for placement and was ultimately admitted 06/16/2019 with altered mental status from UTI.  Palliative care was consulted to help address goals.   SOCIAL HISTORY:     reports that she has never smoked. She has never used smokeless tobacco. She reports previous alcohol use. She reports previous drug use.   Patient is widowed.  She lived at home with her daughter, son-in-law and grandchildren.  Patient has 3 sons who are less involved.  She had another son who is now deceased.  Patient worked as a Public affairs consultantdishwasher.  ADVANCE DIRECTIVES:  Does not have  CODE STATUS: Full code  PAST MEDICAL HISTORY: Past Medical History:  Diagnosis Date  . Anemia   . Dementia (HCC)   . Diabetes mellitus without complication (HCC)     PAST SURGICAL HISTORY:  Past Surgical History:  Procedure Laterality Date  . ABDOMINAL HYSTERECTOMY    . CHOLECYSTECTOMY    . ESOPHAGOGASTRODUODENOSCOPY N/A 02/21/2019   Procedure: ESOPHAGOGASTRODUODENOSCOPY (EGD);  Surgeon: Toledo, Boykin Nearingeodoro K, MD;  Location: [facility] ENDOSCOPY;  Service: Gastroenterology;  Laterality: N/A;  ICU  . ESOPHAGOGASTRODUODENOSCOPY (EGD) WITH PROPOFOL N/A 02/24/2019   Procedure: ESOPHAGOGASTRODUODENOSCOPY (EGD) WITH PROPOFOL;  Surgeon: Toledo, Boykin Nearingeodoro K, MD;  Location: [facility] ENDOSCOPY;   Service: Gastroenterology;  Laterality: N/A;  . stomach ulcers removal      HEMATOLOGY/ONCOLOGY HISTORY:  Oncology History   No history exists.    ALLERGIES:  has No Known Allergies.  MEDICATIONS:  Current Facility-Administered Medications  Medication Dose Route Frequency Provider Last Rate Last Dose  . acetaminophen (TYLENOL) tablet 650 mg  650 mg Oral Q6H PRN Seals, Milas KocherAngela H, NP       Or  . acetaminophen (TYLENOL) suppository 650 mg  650 mg Rectal Q6H PRN Seals, Milas KocherAngela H, NP      . cefTRIAXone (ROCEPHIN) 1 g in sodium chloride 0.9 % 100 mL IVPB  1 g Intravenous Q24H Seals, Angela H, NP 200 mL/hr at 06/17/19 1720 1 g at 06/17/19 1720  . dextrose 50 % solution           . donepezil (ARICEPT) tablet 5 mg  5 mg Oral Daily Irean HongSung, Jade J, MD   5 mg at 06/18/19 0913  . DULoxetine (CYMBALTA) DR capsule 60 mg  60 mg Oral Daily Irean HongSung, Jade J, MD   60 mg at 06/18/19 0913  . enoxaparin (LOVENOX) injection 30 mg  30 mg Subcutaneous Q24H Enedina FinnerPatel, Sona, MD      . feeding supplement (GLUCERNA SHAKE) (GLUCERNA SHAKE) liquid 237 mL  237 mL Oral TID BM Enedina FinnerPatel, Sona, MD   237 mL at 06/18/19 1507  . insulin aspart (novoLOG) injection 0-5 Units  0-5 Units Subcutaneous QHS Pearletha AlfredSeals, Angela H, NP   3 Units at 06/16/19 2124  . insulin aspart (novoLOG) injection 0-9 Units  0-9 Units Subcutaneous TID WC Seals, Marylene LandAngela  H, NP   7 Units at 06/18/19 1234  . insulin detemir (LEVEMIR) injection 10 Units  10 Units Subcutaneous Daily Irean HongSung, Jade J, MD   10 Units at 06/18/19 0913  . multivitamin with minerals tablet 1 tablet  1 tablet Oral Daily Seals, Milas KocherAngela H, NP   1 tablet at 06/18/19 0913  . ondansetron (ZOFRAN) tablet 4 mg  4 mg Oral Q6H PRN Seals, Milas KocherAngela H, NP       Or  . ondansetron (ZOFRAN) injection 4 mg  4 mg Intravenous Q6H PRN Seals, Milas KocherAngela H, NP      . pantoprazole (PROTONIX) EC tablet 40 mg  40 mg Oral Daily Bertram SavinSimpson, Michael L, RPH   40 mg at 06/18/19 0913  . polyethylene glycol (MIRALAX / GLYCOLAX) packet 17 g  17  g Oral Daily PRN Seals, Marylene LandAngela H, NP      . sodium chloride flush (NS) 0.9 % injection 3 mL  3 mL Intravenous Q12H Seals, Milas KocherAngela H, NP   3 mL at 06/17/19 2224    VITAL SIGNS: BP 125/83 (BP Location: Right Arm)   Pulse 75   Temp 98.3 F (36.8 C) (Oral)   Resp 16   Wt 98 lb 12.3 oz (44.8 kg) Comment: bed scale  SpO2 99%   BMI 20.64 kg/m  Filed Weights   06/17/19 1249  Weight: 98 lb 12.3 oz (44.8 kg)    Estimated body mass index is 20.64 kg/m as calculated from the following:   Height as of 06/05/19: 4\' 10"  (1.473 m).   Weight as of this encounter: 98 lb 12.3 oz (44.8 kg).  LABS: CBC:    Component Value Date/Time   WBC 5.2 06/17/2019 0526   HGB 13.6 06/17/2019 0526   HGB 13.6 04/09/2019 1613   HCT 39.6 06/17/2019 0526   HCT 40.4 04/09/2019 1613   PLT 335 06/17/2019 0526   PLT 384 04/09/2019 1613   MCV 84.4 06/17/2019 0526   MCV 91 04/09/2019 1613   NEUTROABS 4.6 06/16/2019 1606   NEUTROABS 3.8 04/09/2019 1613   LYMPHSABS 2.0 06/16/2019 1606   LYMPHSABS 1.9 04/09/2019 1613   MONOABS 0.6 06/16/2019 1606   EOSABS 0.1 06/16/2019 1606   EOSABS 0.1 04/09/2019 1613   BASOSABS 0.0 06/16/2019 1606   BASOSABS 0.0 04/09/2019 1613   Comprehensive Metabolic Panel:    Component Value Date/Time   NA 135 06/17/2019 0526   NA 136 05/30/2019 1622   K 4.0 06/17/2019 0526   CL 103 06/17/2019 0526   CO2 24 06/17/2019 0526   BUN 13 06/17/2019 0526   BUN 14 05/30/2019 1622   CREATININE 0.81 06/17/2019 0526   GLUCOSE 315 (H) 06/17/2019 0526   CALCIUM 9.5 06/17/2019 0526   AST 28 06/16/2019 1606   ALT 25 06/16/2019 1606   ALKPHOS 73 06/16/2019 1606   BILITOT 1.3 (H) 06/16/2019 1606   BILITOT 0.5 05/30/2019 1622   PROT 8.5 (H) 06/16/2019 1606   PROT 7.1 05/30/2019 1622   ALBUMIN 4.5 06/16/2019 1606   ALBUMIN 4.0 05/30/2019 1622    RADIOGRAPHIC STUDIES: Mr Brain Wo Contrast  Result Date: 06/10/2019 CLINICAL DATA:  Impaired memory EXAM: MRI HEAD WITHOUT CONTRAST TECHNIQUE:  Multiplanar, multiecho pulse sequences of the brain and surrounding structures were obtained without intravenous contrast. Additionally, using NeuroQuant software a 3D volumetric analysis of the brain was performed and is compared to a normative database adjusted for age, gender and intracranial volume. COMPARISON:  None. FINDINGS: Brain: Mild motion degraded study. Ventricle  size normal. Subjectively, cerebral volume is normal. Small white matter hyperintensities bilaterally most likely due to chronic microvascular ischemia. Correlate with risk factors. No acute infarct. Negative for hemorrhage or mass. Vascular: Normal arterial flow voids. Skull and upper cervical spine: Negative Sinuses/Orbits: Negative Other: None NeuroQuant Findings: Volumetric analysis of the brain was performed, with a fully detailed report in [employer]. Briefly, the comparison with age and gender matched reference reveals Briefly, ventricle size is normal. Hippocampal volume is greater than 95% of age related volume. IMPRESSION: 1. Normal cerebral volume.  No focal hippocampal atrophy. 2. Mild chronic white matter changes consistent with microvascular ischemia. No acute abnormality. 3. NeuroQuant volumetric analysis of the brain, see details on [employer]. Electronically Signed   By: Franchot Gallo M.D.   On: 06/10/2019 12:25   Dg Chest Port 1 View  Result Date: 06/12/2019 CLINICAL DATA:  Abdominal pain. EXAM: PORTABLE CHEST 1 VIEW COMPARISON:  February 22, 2019 FINDINGS: The heart size and mediastinal contours are within normal limits. Both lungs are clear. The visualized skeletal structures are unremarkable. IMPRESSION: No active disease. Electronically Signed   By: Dorise Bullion III M.D   On: 06/12/2019 11:25   Dg Femur, Min 2 Views Right  Result Date: 06/05/2019 CLINICAL DATA:  Pain status post fall EXAM: RIGHT FEMUR 2 VIEWS COMPARISON:  None. FINDINGS: There is no evidence of fracture or other focal bone lesions. Soft tissues  are unremarkable. IMPRESSION: Negative. Electronically Signed   By: Constance Holster M.D.   On: 06/05/2019 16:13    PERFORMANCE STATUS (ECOG) : 3 - Symptomatic, >50% confined to bed  Review of Systems Unable to complete  Physical Exam General: NAD, frail appearing, thin Pulmonary: unlabored Extremities: no edema, no joint deformities Skin: no rashes Neurological: Weakness but otherwise nonfocal Psych: calm, flat affect  IMPRESSION: Patient has been reportedly confused most of the hospitalization.  Today, she was able to tell me her name and could read off the board in her that she was at [facility] and knew that that was a hospital.  She was able to answer simple questions about her home situation and family.  However, she remained situationally confused and was unable to participate meaningfully in a conversation regarding her goals.  I called and spoke with patient's daughter, with whom patient lived.  Daughter says that patient has been cognitively declining since her son died 1 to 2 years ago.  Daughter is noticed that the cognitive decline has accelerated some over the past several months.  She says that patient was deliberately falling down to try to injure herself and care had become increasingly taxing.  Patient was having behavioral issues that were negatively influencing daughter's children.  For this reason, daughter feels that it would be best for patient to be placed in a care facility.  I note that care management/social work is working on placement.  I also note that APS had been consulted.  I had a lengthy conversation with daughter about dementia and the expected cognitive and functional changes that patient might experience over time.  We discussed future decisions such as scope of treatment, hospitalization, treatment of infections, artificial nutrition, and CODE STATUS.  Daughter would like for patient to remain a full code for now but says that she  would be willing to reconsider as patient's status changes over time.  All questions answered.  PLAN: -Continue current scope of treatment -CM/SW working on placement -Full code    Time Total: 60  minutes  Visit consisted of counseling and education dealing with the complex and emotionally intense issues of symptom management and palliative care in the setting of serious and potentially life-threatening illness.Greater than 50%  of this time was spent counseling and coordinating care related to the above assessment and plan.  Signed by: Altha Harm, PhD, NP-C [phone] (Work Cell)

## 2019-06-18 NOTE — Progress Notes (Signed)
Inpatient Diabetes Program Recommendations  AACE/ADA: New Consensus Statement on Inpatient Glycemic Control (2015)  Target Ranges:  Prepandial:   less than 140 mg/dL      Peak postprandial:   less than 180 mg/dL (1-2 hours)      Critically ill patients:  140 - 180 mg/dL   Results for Bethany James, Bethany James (MRN [id number]) as of 06/18/2019 14:13  Ref. Range 06/17/2019 07:42 06/17/2019 12:07 06/17/2019 17:27 06/17/2019 18:09 06/17/2019 20:44  Glucose-Capillary Latest Ref Range: 70 - 99 mg/dL 305 (H)  12 units NOVOLOG  187 (H)  7 units NOVOLOG +  10 units LEVEMIR  72 106 (H) 123 (H)   Results for Bethany James, Bethany James (MRN [id number]) as of 06/18/2019 14:13  Ref. Range 06/18/2019 05:55 06/18/2019 06:26 06/18/2019 07:33 06/18/2019 09:02 06/18/2019 11:55  Glucose-Capillary Latest Ref Range: 70 - 99 mg/dL 51 (L) 102 (H) 195 (H) 227 (H)  3 units NOVOLOG +  10 units LEVEMIR  344 (H)  7 units NOVOLOG     Home DM Meds: Levemir 10 units Daily       Novolog 5 units TID with meals   Current Orders: Levemir 10 units Daily      Novolog Sensitive Correction Scale/ SSI (0-9 units) TID AC + HS      Has been having labile CBGs the last few days.   RN HELD the Levemir on 08/14 and 08/15.   Levemir was given yest. Very high yest AM but probably b/c Levemir was held for 2 days.   HYPO this AM (CBG 51).   Note Novolog 5 units TID with meals stopped this AM.    MD- I suspect pt's CBG was elevated yesterday AM (08/16) b/c pt did not get Levemir for 2 days prior.    Mild HYPO this AM.  CBG quite elevated at 12pm today.  Note patient is getting Glucerna PO supps TID between meals.   Please consider the following:  1. Reduce Levemir slightly to 8 units Daily  2. Restart Novolog Meal Coverage at a lower dose: Novolog 3 units TID with meals  (Please add the following Hold Parameters: Hold if pt eats <50% of meal, Hold if pt NPO)     --Will follow patient during hospitalization--  Wyn Quaker RN, MSN, CDE Diabetes Coordinator Inpatient Glycemic Control Team Team Pager: [phone] (8a-5p)

## 2019-06-18 NOTE — Progress Notes (Signed)
MD notified of urine output and bladder scan results. Also made aware of bg levels dropping. Orders for D5LR and I&O cath PRN if pt has discomfort. Will continue to monitor.

## 2019-06-18 NOTE — Progress Notes (Signed)
Physical Therapy Treatment Patient Details Name: Bethany James MRN: [id number] DOB: 15-Nov-1960 Today's Date: 06/18/2019    History of Present Illness Pt admitted for UTI. APS involved and trying to find placement. History includes dementia and DM. Has had decline in functional status during hospital admission. PT eval ordered this date.    PT Comments    Pt is making slightly improved progress towards goals. Able to ambulate in room, however fatigues quickly with slight tremors. Complains of headache, ambulated back to bed. Follows commands well. Will continue to progress as able.   Follow Up Recommendations  Home health PT;Supervision/Assistance - 24 hour(vs LTC)     Equipment Recommendations  Rolling walker with 5" wheels    Recommendations for Other Services       Precautions / Restrictions Precautions Precautions: Fall Restrictions Weight Bearing Restrictions: No    Mobility  Bed Mobility Overal bed mobility: Needs Assistance Bed Mobility: Supine to Sit     Supine to sit: Supervision     General bed mobility comments: safe technique and able to transfer to EOB easily  Transfers Overall transfer level: Needs assistance Equipment used: Rolling walker (2 wheeled) Transfers: Sit to/from Stand Sit to Stand: Min guard         General transfer comment: upright posture. Uses RW. Follow commands well  Ambulation/Gait Ambulation/Gait assistance: Min guard Gait Distance (Feet): 50 Feet Assistive device: Rolling walker (2 wheeled) Gait Pattern/deviations: Step-through pattern     General Gait Details: able to ambulate to bathroom and back. Used RW safely. Reciprocal gait pattern performed. Fatigues with increased time with slight tremors noted   Stairs             Wheelchair Mobility    Modified Rankin (Stroke Patients Only)       Balance Overall balance assessment: Needs assistance Sitting-balance support: No upper extremity supported Sitting  balance-Leahy Scale: Good     Standing balance support: Bilateral upper extremity supported Standing balance-Leahy Scale: Fair Standing balance comment: post leaning during standing                            Cognition Arousal/Alertness: Awake/alert Behavior During Therapy: WFL for tasks assessed/performed Overall Cognitive Status: No family/caregiver present to determine baseline cognitive functioning                                        Exercises Other Exercises Other Exercises: ambulated to bathroom with cga. Able to urinate and have BM. CGA for assistance and hygiene. Able to maintain balance to stand and wash hands.    General Comments        Pertinent Vitals/Pain Pain Assessment: Faces Faces Pain Scale: Hurts a little bit Pain Location: head Pain Descriptors / Indicators: Aching Pain Intervention(s): Limited activity within patient's tolerance    Home Living                      Prior Function            PT Goals (current goals can now be found in the care plan section) Acute Rehab PT Goals Patient Stated Goal: unable to state PT Goal Formulation: Patient unable to participate in goal setting Time For Goal Achievement: 07/01/19 Potential to Achieve Goals: Good Progress towards PT goals: Progressing toward goals    Frequency    Min 2X/week  PT Plan Current plan remains appropriate    Co-evaluation              AM-PAC PT "6 Clicks" Mobility   Outcome Measure  Help needed turning from your back to your side while in a flat bed without using bedrails?: None Help needed moving from lying on your back to sitting on the side of a flat bed without using bedrails?: A Little Help needed moving to and from a bed to a chair (including a wheelchair)?: A Little Help needed standing up from a chair using your arms (e.g., wheelchair or bedside chair)?: A Little Help needed to walk in hospital room?: A Little Help  needed climbing 3-5 steps with a railing? : A Little 6 Click Score: 19    End of Session Equipment Utilized During Treatment: Gait belt Activity Tolerance: Patient tolerated treatment well Patient left: in bed;with bed alarm set Nurse Communication: Mobility status PT Visit Diagnosis: Unsteadiness on feet (R26.81);Muscle weakness (generalized) (M62.81);Difficulty in walking, not elsewhere classified (R26.2)     Time: 0347-4259 PT Time Calculation (min) (ACUTE ONLY): 23 min  Charges:  $Gait Training: 8-22 mins $Therapeutic Activity: 8-22 mins                     Greggory Stallion, PT, DPT [phone]    Bethany James 06/18/2019, 4:36 PM

## 2019-06-18 NOTE — NC FL2 (Deleted)
[place] LEVEL OF CARE SCREENING TOOL     IDENTIFICATION  Patient Name: Bethany James Birthdate: 1961-05-16 Sex: female Admission Date (Current Location): 06/12/2019  [place] and [place] Number:  Engineering geologist and Address:  [facility], [address] [place], [place] [address]      Provider Number: [id number]  Attending Physician Name and Address:  Fritzi Mandes, MD  Relative Name and Phone Number:  Rubie Maid  [phone]    Current Level of Care: Hospital Recommended Level of Care: Memory Care Prior Approval Number:    Date Approved/Denied:   PASRR Number: Pending  Discharge Plan: Other (Comment)(Memory Care)    Current Diagnoses: Patient Active Problem List   Diagnosis Date Noted  . Adult failure to thrive   . Palliative care encounter   . UTI (urinary tract infection) 06/16/2019  . Cognitive impairment 05/18/2019  . Duodenal ulcer 04/24/2019  . Elevated TSH 04/24/2019  . Type 2 diabetes mellitus with nephropathy ([place]) 04/09/2019  . Generalized anxiety disorder 04/09/2019  . Depression, major, single episode, moderate ([place]) 04/09/2019  . Hypertensive heart/kidney disease without HF and with CKD stage III ([place]) 04/09/2019  . CKD stage 3 due to type 2 diabetes mellitus ([place]) 04/09/2019  . Hyperlipidemia associated with type 2 diabetes mellitus ([place]) 04/09/2019  . Generalized abdominal pain 04/09/2019  . Type 2 diabetes mellitus with hyperglycemia ([place]) 02/20/2019    Orientation RESPIRATION BLADDER Height & Weight     Self, Time, Place  Normal Incontinent Weight: 44.8 kg(bed scale) Height:     BEHAVIORAL SYMPTOMS/MOOD NEUROLOGICAL BOWEL NUTRITION STATUS      Continent Diet(Fluid Consistency: Thin)  AMBULATORY STATUS COMMUNICATION OF NEEDS Skin   Limited Assist Verbally Normal                       Personal Care Assistance Level of Assistance  Bathing, Feeding, Dressing Bathing Assistance: Limited  assistance Feeding assistance: Independent Dressing Assistance: Limited assistance     Functional Limitations Info  Sight, Hearing, Speech Sight Info: Adequate Hearing Info: Adequate Speech Info: Adequate    SPECIAL CARE FACTORS FREQUENCY                       Contractures Contractures Info: Not present    Additional Factors Info  Code Status, Allergies Code Status Info: FULL Allergies Info: No known allergies           Current Medications (06/18/2019):  This is the current hospital active medication list Current Facility-Administered Medications  Medication Dose Route Frequency Provider Last Rate Last Dose  . acetaminophen (TYLENOL) tablet 650 mg  650 mg Oral Q6H PRN Seals, Theo Dills, NP       Or  . acetaminophen (TYLENOL) suppository 650 mg  650 mg Rectal Q6H PRN Seals, Theo Dills, NP      . cefTRIAXone (ROCEPHIN) 1 g in sodium chloride 0.9 % 100 mL IVPB  1 g Intravenous Q24H Seals, Angela H, NP 200 mL/hr at 06/17/19 1720 1 g at 06/17/19 1720  . dextrose 50 % solution           . donepezil (ARICEPT) tablet 5 mg  5 mg Oral Daily Paulette Blanch, MD   5 mg at 06/18/19 0913  . DULoxetine (CYMBALTA) DR capsule 60 mg  60 mg Oral Daily Paulette Blanch, MD   60 mg at 06/18/19 0913  . enoxaparin (LOVENOX) injection 30 mg  30 mg Subcutaneous  Q24H Enedina FinnerPatel, Sona, MD      . feeding supplement (GLUCERNA SHAKE) (GLUCERNA SHAKE) liquid 237 mL  237 mL Oral TID BM Enedina FinnerPatel, Sona, MD   237 mL at 06/18/19 1507  . insulin aspart (novoLOG) injection 0-5 Units  0-5 Units Subcutaneous QHS Pearletha AlfredSeals, Angela H, NP   3 Units at 06/16/19 2124  . insulin aspart (novoLOG) injection 0-9 Units  0-9 Units Subcutaneous TID WC Seals, Milas KocherAngela H, NP   7 Units at 06/18/19 1234  . insulin detemir (LEVEMIR) injection 10 Units  10 Units Subcutaneous Daily Irean HongSung, Jade J, MD   10 Units at 06/18/19 0913  . multivitamin with minerals tablet 1 tablet  1 tablet Oral Daily Seals, Milas KocherAngela H, NP   1 tablet at 06/18/19 0913  .  ondansetron (ZOFRAN) tablet 4 mg  4 mg Oral Q6H PRN Seals, Milas KocherAngela H, NP       Or  . ondansetron (ZOFRAN) injection 4 mg  4 mg Intravenous Q6H PRN Seals, Milas KocherAngela H, NP      . pantoprazole (PROTONIX) EC tablet 40 mg  40 mg Oral Daily Bertram SavinSimpson, Michael L, RPH   40 mg at 06/18/19 0913  . polyethylene glycol (MIRALAX / GLYCOLAX) packet 17 g  17 g Oral Daily PRN Seals, Marylene LandAngela H, NP      . sodium chloride flush (NS) 0.9 % injection 3 mL  3 mL Intravenous Q12H Seals, Milas KocherAngela H, NP   3 mL at 06/17/19 2224     Discharge Medications: Please see discharge summary for a list of discharge medications.  Relevant Imaging Results:  Relevant Lab Results:   Additional Information SSN:  [id number]  Allayne ButcherJeanna M Olimpia Tinch, RN

## 2019-06-18 NOTE — TOC Progression Note (Signed)
Transition of Care [facility]) - Progression Note    Patient Details  Name: Bethany James MRN: [id number] Date of Birth: Dec 10, 1960  [facility]) CM/SW Contact  Shelbie Hutching, RN Phone Number: 06/18/2019, 4:23 PM  Clinical Narrative:    [facility] has offered a bed.  [facility] is pending, [facility] needs the pasrr to come back before they will admit to the Memory Care unit.     Expected Discharge Plan: Memory Care Barriers to Discharge: SNF Pending bed offer  Expected Discharge Plan and Services Expected Discharge Plan: Memory Care   Discharge Planning Services: CM Consult                                           Social Determinants of Health (SDOH) Interventions    Readmission Risk Interventions No flowsheet data found.

## 2019-06-18 NOTE — Progress Notes (Signed)
[place] [facility] [place] NAME: Bethany James    MR#:  [id number]  DATE OF BIRTH:  Apr 02, 1961  SUBJECTIVE:   Patient was in the ER for several days however she continues to decline found to have UTI admitted now on the hospitalist service. She does not talk much answered a few questions. She is a dementia. Per report daughter not able to care for her.  Patient this morning was not much alert or talking to me. Ate very little according to the nurse. She does have intermittent sugar drops in the morning hours.  Placement process has been started by ER Education officer, museum. REVIEW OF SYSTEMS:   Review of Systems  Unable to perform ROS: Dementia   Tolerating Diet: some Tolerating PT:   DRUG ALLERGIES:  No Known Allergies  VITALS:  Blood pressure 125/83, pulse 75, temperature 98.3 F (36.8 C), temperature source Oral, resp. rate 16, weight 44.8 kg, SpO2 99 %.  PHYSICAL EXAMINATION:   Physical Exam did exam  GENERAL:  58 y.o.-year-old patient lying in the bed with no acute distress. Appears chronically ill EYES: Pupils equal, round, reactive to light and accommodation. No scleral icterus. Extraocular muscles intact.  HEENT: Head atraumatic, normocephalic. Oropharynx and nasopharynx clear.  NECK:  Supple, no jugular venous distention. No thyroid enlargement, no tenderness.  LUNGS: Normal breath sounds bilaterally, no wheezing, rales, rhonchi. No use of accessory muscles of respiration.  CARDIOVASCULAR: S1, S2 normal. No murmurs, rubs, or gallops.  ABDOMEN: Soft, nontender, nondistended. Bowel sounds present. EXTREMITIES: No cyanosis, clubbing or edema b/l.    NEUROLOGIC: Cranial nerves II through XII are intact. No focal Motor or sensory deficits b/l. Moves all extremities well grossly nonfocal. Generalized weakness PSYCHIATRIC:  patient is alert and dementia at baseline SKIN: No obvious rash, lesion, or ulcer.   LABORATORY PANEL:  CBC Recent  Labs  Lab 06/17/19 0526  WBC 5.2  HGB 13.6  HCT 39.6  PLT 335    Chemistries  Recent Labs  Lab 06/16/19 1606 06/17/19 0526  NA 138 135  K 3.7 4.0  CL 101 103  CO2 25 24  GLUCOSE 64* 315*  BUN 23* 13  CREATININE 0.85 0.81  CALCIUM 10.2 9.5  AST 28  --   ALT 25  --   ALKPHOS 73  --   BILITOT 1.3*  --    Cardiac Enzymes No results for input(s): TROPONINI in the last 168 hours. RADIOLOGY:  No results found. ASSESSMENT AND PLAN:   Bethany James  is a 58 y.o. female with a known history of dementia, diabetes mellitus.  She was originally brought to the hospital from home where she lives with her daughter by EMS services on 06/12/2019 with patient's daughter reporting frequent falls and that she is no longer capable of taking care of the patient who is her mother  1. Urinary tract infection - IV Rocephin - urine culture result shows more than hundred thousand colonies of E. coli when available and adjust treatment as indicated  2.  Failure to thrive - D5LR infusing to peripheral IV at 75 cc/h-- sugars trending high discontinue IV fluids - We will continue to encourage oral intake - Dietitian consulted for further evaluation and recommendations -patient advised to take bedtime snack  3.  Diabetes mellitus - Uncontrolled with episodes of hyperglycemia and hypoglycemia .- Sliding scale insulin  4.  Dementia - Patient has been evaluated by psychiatry and cleared according to documentation - Awaiting  social service placement evaluation on Monday, 06/18/2019 -Continue Aricept -palliative care has seen patient--- goals of care  5.  History of depression - Continue Cymbalta   - DVT and PPI prophylaxis   Child psychotherapistocial worker for discharge planning  CODE STATUS: full  DVT Prophylaxis: Lovenox  TOTAL TIME TAKING CARE OF THIS PATIENT: *25* minutes.  >50% time spent on counselling and coordination of care  POSSIBLE D/C IN ? DAYS, DEPENDING ON CLINICAL CONDITION.  Note:  This dictation was prepared with Dragon dictation along with smaller phrase technology. Any transcriptional errors that result from this process are unintentional.  Enedina FinnerSona Maryiah Olvey M.D on 06/18/2019 at 3:21 PM  Between 7am to 6pm - Pager - [phone]  After 6pm go to [email or web address] - Social research officer, [email or web address] [email or web address]  [facility]  Office  [phone]  CC: Primary care physician; Marjie Skiffannady, Jolene T, NPPatient ID: Bethany OdeaJamica James, female   DOB: 07-17-1961, 58 y.o.   MRN: [id number]

## 2019-06-19 LAB — URINE CULTURE: Culture: >=100000 — AB

## 2019-06-19 LAB — GLUCOSE, CAPILLARY
Glucose-Capillary: 114 mg/dL — ABNORMAL HIGH (ref 70–99)
Glucose-Capillary: 124 mg/dL — ABNORMAL HIGH (ref 70–99)
Glucose-Capillary: 252 mg/dL — ABNORMAL HIGH (ref 70–99)
Glucose-Capillary: 256 mg/dL — ABNORMAL HIGH (ref 70–99)
Glucose-Capillary: 260 mg/dL — ABNORMAL HIGH (ref 70–99)
Glucose-Capillary: 30 mg/dL — CL (ref 70–99)
Glucose-Capillary: 303 mg/dL — ABNORMAL HIGH (ref 70–99)

## 2019-06-19 MED ORDER — CEPHALEXIN 500 MG PO CAPS
500.0000 mg | ORAL_CAPSULE | Freq: Two times a day (BID) | ORAL | Status: AC
  Administered 2019-06-19 – 2019-06-20 (×3): 500 mg via ORAL
  Filled 2019-06-19 (×3): qty 1

## 2019-06-19 MED ORDER — CEPHALEXIN 500 MG PO CAPS
500.0000 mg | ORAL_CAPSULE | Freq: Three times a day (TID) | ORAL | Status: DC
  Administered 2019-06-19: 10:00:00 500 mg via ORAL
  Filled 2019-06-19: qty 1

## 2019-06-19 MED ORDER — INSULIN DETEMIR 100 UNIT/ML ~~LOC~~ SOLN
7.0000 [IU] | Freq: Every day | SUBCUTANEOUS | Status: DC
  Administered 2019-06-19 – 2019-06-21 (×3): 7 [IU] via SUBCUTANEOUS
  Filled 2019-06-19 (×4): qty 0.07

## 2019-06-19 NOTE — Progress Notes (Signed)
[facility] - [place] at [facility]   PATIENT NAME: Bethany James    MR#:  [id number]  DATE OF BIRTH:  28-Jan-1961  SUBJECTIVE:   In a good breakfast. Her sugars bottom down during the daytime due to her labile PO intake. New complaints REVIEW OF SYSTEMS:   Review of Systems  Unable to perform ROS: Dementia   Tolerating Diet: some Tolerating PT: rehab  DRUG ALLERGIES:  No Known Allergies  VITALS:  Blood pressure (!) 135/93, pulse 71, temperature 98.7 F (37.1 C), resp. rate 17, weight 44.8 kg, SpO2 99 %.  PHYSICAL EXAMINATION:   Physical Exam did exam  GENERAL:  58 y.o.-year-old patient lying in the bed with no acute distress. Appears chronically ill EYES: Pupils equal, round, reactive to light and accommodation. No scleral icterus. Extraocular muscles intact.  HEENT: Head atraumatic, normocephalic. Oropharynx and nasopharynx clear.  NECK:  Supple, no jugular venous distention. No thyroid enlargement, no tenderness.  LUNGS: Normal breath sounds bilaterally, no wheezing, rales, rhonchi. No use of accessory muscles of respiration.  CARDIOVASCULAR: S1, S2 normal. No murmurs, rubs, or gallops.  ABDOMEN: Soft, nontender, nondistended. Bowel sounds present. EXTREMITIES: No cyanosis, clubbing or edema b/l.    NEUROLOGIC: Cranial nerves II through XII are intact. No focal Motor or sensory deficits b/l. Moves all extremities well grossly nonfocal. Generalized weakness PSYCHIATRIC:  patient is alert and dementia at baseline SKIN: No obvious rash, lesion, or ulcer.   LABORATORY PANEL:  CBC Recent Labs  Lab 06/17/19 0526  WBC 5.2  HGB 13.6  HCT 39.6  PLT 335    Chemistries  Recent Labs  Lab 06/16/19 1606 06/17/19 0526  NA 138 135  K 3.7 4.0  CL 101 103  CO2 25 24  GLUCOSE 64* 315*  BUN 23* 13  CREATININE 0.85 0.81  CALCIUM 10.2 9.5  AST 28  --   ALT 25  --   ALKPHOS 73  --   BILITOT 1.3*  --    Cardiac Enzymes No results for input(s):  TROPONINI in the last 168 hours. RADIOLOGY:  No results found. ASSESSMENT AND PLAN:   Bethany OdeaJamica Calise  is a 58 y.o. female with a known history of dementia, diabetes mellitus.  She was originally brought to the hospital from home where she lives with her daughter by EMS services on 06/12/2019 with patient's daughter reporting frequent falls and that she is no longer capable of taking care of the patient who is her mother  1. Urinary tract infection - IV Rocephin--- E. coli insensitive change to PO Keflex - urine culture result shows more than hundred thousand colonies of E. coli  2.  Failure to thrive - We will continue to encourage oral intake - Dietitian consulted for further evaluation and recommendations -patient advised to take bedtime snack  3.  Diabetes mellitus - Uncontrolled with episodes of hyperglycemia and hypoglycemia .- Sliding scale insulin -decrease Lantus to seven units to avoid daytime and early morning hypoglycemia episode  4.  Dementia - Patient has been evaluated by psychiatry and cleared according to documentation - Awaiting social service placement-- waiting for [facility]. [facility] -Continue Aricept -palliative care has seen patient--- goals of care  5.  History of depression - Continue Cymbalta   Discharge once Pass[facility] available   Social worker for discharge planning  CODE STATUS: full  DVT Prophylaxis: Lovenox  TOTAL TIME TAKING CARE OF THIS PATIENT: *25* minutes.  >50% time  spent on counselling and coordination of care  POSSIBLE D/C IN ? DAYS, DEPENDING ON CLINICAL CONDITION.  Note: This dictation was prepared with Dragon dictation along with smaller phrase technology. Any transcriptional errors that result from this process are unintentional.  Fritzi Mandes M.D on 06/19/2019 at 9:55 AM  Between 7am to 6pm - Pager - [phone]  After 6pm go to [email or web address] - password [email or web address]  Office  [phone]  CC: Primary care physician; Venita Lick, NPPatient ID: [name], female   DOB: Jan 16, 1961, 58 y.o.   MRN: [id number]

## 2019-06-19 NOTE — Progress Notes (Signed)
Pt belongings were delivered by Gerald Stabs (family member), a purse with an EBT and debit card was also delivered. Per daughter Rubie Maid, she was requested to send belongings to the hospital by Social work. I requested Gerald Stabs to take the purse with the EBT and the debit card back with him. Gerald Stabs and Rubie Maid will check  with social worker tomorrow if a debit and EBT card are needed?

## 2019-06-19 NOTE — Progress Notes (Signed)
Inpatient Diabetes Program Recommendations  AACE/ADA: New Consensus Statement on Inpatient Glycemic Control (2015)  Target Ranges:  Prepandial:   less than 140 mg/dL      Peak postprandial:   less than 180 mg/dL (1-2 hours)      Critically ill patients:  140 - 180 mg/dL   Results for [name], [name] (MRN [id number]) as of 06/19/2019 13:54  Ref. Range 06/17/2019 07:42 06/17/2019 12:07 06/17/2019 17:27 06/17/2019 18:09 06/17/2019 20:44 06/17/2019 23:47  Glucose-Capillary Latest Ref Range: 70 - 99 mg/dL 305 (H)  12 units NOVOLOG  187 (H)  7 units NOVOLOG +  10 units LEVEMIR  72 106 (H) 123 (H) 147 (H)   Results for [name], [name] (MRN [id number]) as of 06/19/2019 13:54  Ref. Range 06/18/2019 05:55 06/18/2019 06:26 06/18/2019 07:33 06/18/2019 09:02 06/18/2019 11:55 06/18/2019 17:15 06/18/2019 17:18 06/18/2019 17:55 06/18/2019 21:49  Glucose-Capillary Latest Ref Range: 70 - 99 mg/dL 51 (L) 102 (H) 195 (H)    227 (H)  3 units NOVOLOG +  10 units LEVEMIR 344 (H)  7 units NOVOLOG  30 (LL) 41 (LL) 182 (H) 207 (H)   Results for [name], [name] (MRN [id number]) as of 06/19/2019 13:54  Ref. Range 06/19/2019 00:30 06/19/2019 04:57 06/19/2019 07:40 06/19/2019 11:40  Glucose-Capillary Latest Ref Range: 70 - 99 mg/dL 252 (H) 260 (H) 256 (H)  5 units NOVOLOG  303 (H)  7 units NOVOLOG +  7 units LEVEMIR     Home DM Meds: Levemir 10 units Daily                             Novolog 5 units TID with meals   Current Orders: Levemir 7 units Daily                            Novolog Sensitive Correction Scale/ SSI (0-9 units) TID AC + HS      Has been having labile CBGs the last few days.   RN HELD the Levemir on 08/14 and 08/15.   HYPO yesterday AM (CBG 51) and HYPO again at 5pm yesterday (CBG 30 mg/dl).  Note Levemir reduced to 7 units Daily today.  CBGs quite elevated today.    MD- Note patient with poor PO intake but she is managing to drink her Glucerna PO supps TID between  meals.  Since PO intake poor, may consider increasing the intensity and the frequency of her Novolog SSI regimen:  Recommend Change/Increase Novolog SSi to Moderate scale (0-15 units) Q4 hours     --Will follow patient during hospitalization--  Wyn Quaker RN, MSN, CDE Diabetes Coordinator Inpatient Glycemic Control Team Team Pager: [phone] (8a-5p)

## 2019-06-19 NOTE — [place] FL2 (Signed)
[place] MEDICAID FL2 LEVEL OF CARE SCREENING TOOL     IDENTIFICATION  Patient Name: Bethany James Birthdate: 10-Nov-1960 Sex: female Admission Date (Current Location): 06/12/2019  [place]   Facility and Address:  [facility], [address], [place], [place] [address]      Provider Number: [id number]  Attending Physician Name and Address:  Enedina FinnerPatel, Sona, MD  Relative Name and Phone Number:  Sima [place]  [phone]    Current Level of Care: Hospital Recommended Level of Care: Skilled Nursing Facility, Memory Care Prior Approval Number:    Date Approved/Denied:   PASRR Number: Pending  Discharge Plan: SNF(SNF (Memory Care))    Current Diagnoses: Patient Active Problem List   Diagnosis Date Noted  . Adult failure to thrive   . Palliative care encounter   . UTI (urinary tract infection) 06/16/2019  . Cognitive impairment 05/18/2019  . Duodenal ulcer 04/24/2019  . Elevated TSH 04/24/2019  . Type 2 diabetes mellitus with nephropathy (HCC) 04/09/2019  . Generalized anxiety disorder 04/09/2019  . Depression, major, single episode, moderate (HCC) 04/09/2019  . Hypertensive heart/kidney disease without HF and with CKD stage III (HCC) 04/09/2019  . CKD stage 3 due to type 2 diabetes mellitus (HCC) 04/09/2019  . Hyperlipidemia associated with type 2 diabetes mellitus (HCC) 04/09/2019  . Generalized abdominal pain 04/09/2019  . Type 2 diabetes mellitus with hyperglycemia (HCC) 02/20/2019    Orientation RESPIRATION BLADDER Height & Weight     Self, Time, Place  Normal Incontinent Weight: 44.8 kg(bed scale) Height:     BEHAVIORAL SYMPTOMS/MOOD NEUROLOGICAL BOWEL NUTRITION STATUS      Continent Diet(Fluid Consistency: Thin)  AMBULATORY STATUS COMMUNICATION OF NEEDS Skin   Limited Assist Verbally Normal                       Personal Care Assistance Level of Assistance  Bathing, Feeding, Dressing Bathing Assistance:  Limited assistance Feeding assistance: Independent Dressing Assistance: Limited assistance     Functional Limitations Info  Sight, Hearing, Speech Sight Info: Adequate Hearing Info: Adequate Speech Info: Adequate    SPECIAL CARE FACTORS FREQUENCY                       Contractures Contractures Info: Not present    Additional Factors Info  Code Status, Allergies Code Status Info: FULL Allergies Info: No known allergies           Current Medications (06/19/2019):  This is the current hospital active medication list Current Facility-Administered Medications  Medication Dose Route Frequency Provider Last Rate Last Dose  . acetaminophen (TYLENOL) tablet 650 mg  650 mg Oral Q6H PRN Seals, Milas KocherAngela H, NP       Or  . acetaminophen (TYLENOL) suppository 650 mg  650 mg Rectal Q6H PRN Seals, Milas KocherAngela H, NP      . cefTRIAXone (ROCEPHIN) 1 g in sodium chloride 0.9 % 100 mL IVPB  1 g Intravenous Q24H Seals, Angela H, NP 200 mL/hr at 06/18/19 2204 1 g at 06/18/19 2204  . donepezil (ARICEPT) tablet 5 mg  5 mg Oral Daily Irean HongSung, Jade J, MD   5 mg at 06/18/19 0913  . DULoxetine (CYMBALTA) DR capsule 60 mg  60 mg Oral Daily Irean HongSung, Jade J, MD   60 mg at 06/18/19 0913  . enoxaparin (LOVENOX) injection 30 mg  30 mg Subcutaneous Q24H Enedina FinnerPatel, Sona, MD   30 mg at 06/18/19 2158  .  feeding supplement (GLUCERNA SHAKE) (GLUCERNA SHAKE) liquid 237 mL  237 mL Oral TID BM Fritzi Mandes, MD   237 mL at 06/18/19 2157  . insulin aspart (novoLOG) injection 0-5 Units  0-5 Units Subcutaneous QHS Mayer Camel, NP   3 Units at 06/16/19 2124  . insulin aspart (novoLOG) injection 0-9 Units  0-9 Units Subcutaneous TID WC Seals, Theo Dills, NP   5 Units at 06/19/19 0818  . insulin detemir (LEVEMIR) injection 10 Units  10 Units Subcutaneous Daily Paulette Blanch, MD   10 Units at 06/18/19 0913  . multivitamin with minerals tablet 1 tablet  1 tablet Oral Daily Seals, Theo Dills, NP   1 tablet at 06/18/19 0913  . ondansetron  (ZOFRAN) tablet 4 mg  4 mg Oral Q6H PRN Seals, Theo Dills, NP       Or  . ondansetron (ZOFRAN) injection 4 mg  4 mg Intravenous Q6H PRN Seals, Theo Dills, NP      . pantoprazole (PROTONIX) EC tablet 40 mg  40 mg Oral Daily Charlett Nose, RPH   40 mg at 06/18/19 0913  . polyethylene glycol (MIRALAX / GLYCOLAX) packet 17 g  17 g Oral Daily PRN Seals, Levada Dy H, NP      . sodium chloride flush (NS) 0.9 % injection 3 mL  3 mL Intravenous Q12H Seals, Theo Dills, NP   3 mL at 06/18/19 2158     Discharge Medications: Please see discharge summary for a list of discharge medications.  Relevant Imaging Results:  Relevant Lab Results:   Additional Information SSN:  [id number]  Shelbie Hutching, RN

## 2019-06-20 ENCOUNTER — Telehealth: Payer: Self-pay

## 2019-06-20 LAB — GLUCOSE, CAPILLARY
Glucose-Capillary: 123 mg/dL — ABNORMAL HIGH (ref 70–99)
Glucose-Capillary: 190 mg/dL — ABNORMAL HIGH (ref 70–99)
Glucose-Capillary: 191 mg/dL — ABNORMAL HIGH (ref 70–99)
Glucose-Capillary: 277 mg/dL — ABNORMAL HIGH (ref 70–99)
Glucose-Capillary: 360 mg/dL — ABNORMAL HIGH (ref 70–99)

## 2019-06-20 NOTE — Progress Notes (Signed)
Nutrition Follow-up  DOCUMENTATION CODES:   Not applicable  INTERVENTION:  Continue Glucerna Shake po TID, each supplement provides 220 kcal and 10 grams of protein.  Continue daily MVI.   Provide bedtime snack. RD ordered in [place].  NUTRITION DIAGNOSIS:   Inadequate oral intake related to decreased appetite(confusion) as evidenced by meal completion < 50%.  Ongoing.  GOAL:   Patient will meet greater than or equal to 90% of their needs  Progressing.  MONITOR:   PO intake, Supplement acceptance, Labs, Weight trends, I & O's  REASON FOR ASSESSMENT:   Consult Assessment of nutrition requirement/status, Poor PO  ASSESSMENT:   58 year old female with PMHx of dementia, DM, anemia admitted with UTI, FTT.  Patient's PO intake remains variable. Yesterday she ate 80% of dinner but she only had 20% of breakfast this morning. Overall appears to be eating only 10-20% of meals. She is drinking her Glucerna. Planning for discharge to memory care unit.  Medications reviewed and include: Novolog 0-9 units TID, Novolog 0-5 units QHS, Levemir 7 units daily, MVI, pantoprazole.  Labs reviewed: CBG 114-360.  Diet Order:   Diet Order            Diet regular Room service appropriate? Yes; Fluid consistency: Thin  Diet effective now             EDUCATION NEEDS:   Not appropriate for education at this time  Skin:  Skin Assessment: Reviewed RN Assessment  Last BM:  06/20/2019 - medium type 3  Height:   Ht Readings from Last 1 Encounters:  06/05/19 4\' 10"  (1.473 m)   Weight:   Wt Readings from Last 1 Encounters:  06/17/19 44.8 kg   Ideal Body Weight:  43.9 kg  BMI:  Body mass index is 20.64 kg/m.  Estimated Nutritional Needs:   Kcal:  1200-1400  Protein:  60-70 grams  Fluid:  1.2-1.4 L/day  Willey Blade, MS, RD, LDN Office: [phone] Pager: [phone] After Hours/Weekend Pager: [phone]

## 2019-06-20 NOTE — Progress Notes (Signed)
[facility] [place] [facility]   PATIENT NAME: Bethany James    MR#:  [id number]  DATE OF BIRTH:  June 17, 1961  SUBJECTIVE:   Ate  good breakfast. Her sugars bottom down during the daytime due to her labile PO intake. New complaints Fed BF by herself by staff REVIEW OF SYSTEMS:   Review of Systems  Unable to perform ROS: Dementia   Tolerating Diet: yes Tolerating PT: rehab  DRUG ALLERGIES:  No Known Allergies  VITALS:  Blood pressure 136/89, pulse 71, temperature 98.5 F (36.9 C), resp. rate 17, weight 44.8 kg, SpO2 100 %.  PHYSICAL EXAMINATION:   Physical Exam did exam  GENERAL:  58 y.o.-year-old patient lying in the bed with no acute distress. Appears chronically ill EYES: Pupils equal, round, reactive to light and accommodation. No scleral icterus. Extraocular muscles intact.  HEENT: Head atraumatic, normocephalic. Oropharynx and nasopharynx clear.  NECK:  Supple, no jugular venous distention. No thyroid enlargement, no tenderness.  LUNGS: Normal breath sounds bilaterally, no wheezing, rales, rhonchi. No use of accessory muscles of respiration.  CARDIOVASCULAR: S1, S2 normal. No murmurs, rubs, or gallops.  ABDOMEN: Soft, nontender, nondistended. Bowel sounds present. EXTREMITIES: No cyanosis, clubbing or edema b/l.    NEUROLOGIC: Cranial nerves II through XII are intact. No focal Motor or sensory deficits b/l. Moves all extremities well grossly nonfocal. Generalized weakness PSYCHIATRIC:  patient is alert and dementia at baseline SKIN: No obvious rash, lesion, or ulcer.   LABORATORY PANEL:  CBC Recent Labs  Lab 06/17/19 0526  WBC 5.2  HGB 13.6  HCT 39.6  PLT 335    Chemistries  Recent Labs  Lab 06/16/19 1606 06/17/19 0526  NA 138 135  K 3.7 4.0  CL 101 103  CO2 25 24  GLUCOSE 64* 315*  BUN 23* 13  CREATININE 0.85 0.81  CALCIUM 10.2 9.5  AST 28  --   ALT 25  --   ALKPHOS 73  --   BILITOT 1.3*  --    Cardiac Enzymes No  results for input(s): TROPONINI in the last 168 hours. RADIOLOGY:  No results found. ASSESSMENT AND PLAN:   Bethany James  is a 58 y.o. female with a known history of dementia, diabetes mellitus.  She was originally brought to the hospital from home where she lives with her daughter by EMS services on 06/12/2019 with patient's daughter reporting frequent falls and that she is no longer capable of taking care of the patient who is her mother  1. Urinary tract infection - IV Rocephin--- E. coli insensitive change to PO Keflex - urine culture result shows more than hundred thousand colonies of E. coli  2.  Failure to thrive - continue to encourage oral intake - Dietitian consulted for further evaluation and recommendations -patient advised to take bedtime snack  3.  Diabetes mellitus - Uncontrolled with episodes of hyperglycemia and hypoglycemia - Sliding scale insulin -decrease Lantus to seven units to avoid daytime and early morning hypoglycemia episode  4.  Dementia - Patient has been evaluated by psychiatry and cleared according to documentation - Awaiting social service placement-- waiting for [facility]. [facility] -Continue Aricept -palliative care has seen patient--- goals of care  5. History of depression - Continue Cymbalta   Discharge once [facility] available   Social worker for discharge planning  CODE STATUS: full  DVT Prophylaxis: Lovenox  TOTAL TIME TAKING CARE OF THIS PATIENT: *25* minutes.  >  50% time spent on counselling and coordination of care  POSSIBLE D/C IN ? DAYS, DEPENDING ON CLINICAL CONDITION.  Note: This dictation was prepared with Dragon dictation along with smaller phrase technology. Any transcriptional errors that result from this process are unintentional.  Fritzi Mandes M.D on 06/20/2019 at 11:55 AM  Between 7am to 6pm - Pager - [phone]  After 6pm go to [email or web address] - password [email or web address]  [facility]  Office  [phone]  CC: Primary care physician; Venita Lick, NPPatient ID: Bethany James, female   DOB: 03-13-1961, 58 y.o.   MRN: [id number]

## 2019-06-20 NOTE — Progress Notes (Signed)
Inpatient Diabetes Program Recommendations  AACE/ADA: New Consensus Statement on Inpatient Glycemic Control (2015)  Target Ranges:  Prepandial:   less than 140 mg/dL      Peak postprandial:   less than 180 mg/dL (1-2 hours)      Critically ill patients:  140 - 180 mg/dL   Lab Results  Component Value Date   GLUCAP 360 (H) 06/20/2019   HGBA1C 7.6 (H) 05/30/2019    Review of Glycemic Control Results for Bethany James, Bethany James (MRN [id number]) as of 06/20/2019 13:37  Ref. Range 06/19/2019 00:30 06/19/2019 04:57 06/19/2019 07:40 06/19/2019 11:40 06/19/2019 16:44 06/19/2019 20:46 06/20/2019 00:04 06/20/2019 05:52 06/20/2019 07:44 06/20/2019 11:30  Glucose-Capillary Latest Ref Range: 70 - 99 mg/dL 252 (H) 260 (H) 256 (H) 303 (H) 124 (H) 114 (H) 191 (H) 190 (H) 277 (H) 360 (H)  Home DM Meds: Levemir 10 units Daily Novolog 5 units TID with meals  CurrentOrders: Levemir 7units Daily Novolog Sensitive Correction Scale/ SSI (0-9 units) TID AC + HS Inpatient Diabetes Program Recommendations:    It appears that patient's intake is reduced however she is taking in Glucerna supplement.  May consider adding Novolog 1 unit with each Glucerna supplement.   Thanks,  Adah Perl, RN, BC-ADM Inpatient Diabetes Coordinator Pager [phone] (8a-5p)

## 2019-06-21 ENCOUNTER — Ambulatory Visit: Payer: Self-pay | Admitting: Licensed Clinical Social Worker

## 2019-06-21 LAB — GLUCOSE, CAPILLARY
Glucose-Capillary: 143 mg/dL — ABNORMAL HIGH (ref 70–99)
Glucose-Capillary: 255 mg/dL — ABNORMAL HIGH (ref 70–99)
Glucose-Capillary: 270 mg/dL — ABNORMAL HIGH (ref 70–99)
Glucose-Capillary: 157 mg/dL — ABNORMAL HIGH (ref 70–99)

## 2019-06-21 LAB — SARS CORONAVIRUS 2 BY RT PCR (HOSPITAL ORDER, PERFORMED IN ~~LOC~~ HOSPITAL LAB): SARS Coronavirus 2: NEGATIVE

## 2019-06-21 MED ORDER — ADULT MULTIVITAMIN W/MINERALS CH: 1.0000 | ORAL_TABLET | Freq: Every day | ORAL | 0 refills | Status: AC

## 2019-06-21 MED ORDER — INSULIN DETEMIR 100 UNIT/ML FLEXPEN: 7.0000 [IU] | PEN_INJECTOR | Freq: Every day | SUBCUTANEOUS | 11 refills | Status: AC

## 2019-06-21 MED ORDER — DULOXETINE HCL 60 MG PO CPEP: 60.0000 mg | ORAL_CAPSULE | Freq: Every day | ORAL | 0 refills | Status: AC

## 2019-06-21 NOTE — TOC Progression Note (Signed)
Transition of Care [facility]) - Progression Note    Patient Details  Name: Bethany James MRN: [id number] Date of Birth: 01-11-1961  Transition of [facility]) CM/SW Contact  Shelbie Hutching, RN Phone Number: 06/21/2019, 9:32 AM  Clinical Narrative:    Pasrr received [id number] H.  Message left with Marden Noble at the [facility].   Expected Discharge Plan: Memory Care Barriers to Discharge: SNF Pending bed offer  Expected Discharge Plan and Services Expected Discharge Plan: Memory Care   Discharge Planning Services: CM Consult                                           Social Determinants of Health (SDOH) Interventions    Readmission Risk Interventions No flowsheet data found.

## 2019-06-21 NOTE — TOC Transition Note (Signed)
Transition of Care [facility]) - CM/SW Discharge Note   Patient Details  Name: Genevia Bouldin MRN: [id number] Date of Birth: 19-Oct-1961  Transition of Care [facility]) CM/SW Contact:  Shelbie Hutching, RN Phone Number: 06/21/2019, 12:59 PM   Clinical Narrative:    Patient will discharge to [facility] in [place] and will transport via [employer].  Daughter Rubie Maid has been updated and is pleased with plan of care.  Patient will be going to the Memory Care unit at the [facility] to [place].  Bedside RN to call report.    Final next level of care: Memory Care(SNF) Barriers to Discharge: Barriers Resolved   Patient Goals and CMS Choice        Discharge Placement PASRR number recieved: 06/21/19            Patient chooses bed at: [facility] Patient to be transferred to facility by: [facility] Name of family member notified: Rubie Maid- daughter Patient and family notified of of transfer: 06/21/19  Discharge Plan and Services   Discharge Planning Services: CM Consult                                 Social Determinants of Health ([place]) Interventions     Readmission Risk Interventions No flowsheet data found.

## 2019-06-21 NOTE — Chronic Care Management (AMB) (Signed)
  Care Management   Follow Up Note   06/21/2019 Name: Bethany James MRN: [id number] DOB: February 18, 1961  Referred by: Venita Lick, NP Reason for referral : Care Coordination   Bethany James is a 58 y.o. year old female who is a primary care patient of Cannady, Barbaraann Faster, NP. The care management team was consulted for assistance with care management and care coordination needs.    Review of patient status, including review of consultants reports, relevant laboratory and other test results, and collaboration with appropriate care team members and the patient's provider was performed as part of comprehensive patient evaluation and provision of chronic care management services.    LCSW received in basket message from PCP that hospital notified her that patient will be discharging and going to the memory care ward at [facility] in [place] placement goal met.   The patient has been provided with contact information for the care management team and has been advised to call with any health related questions or concerns.   Eula Fried, BSW, MSW, [facility] Care Management [email or web address] Phone: [phone]

## 2019-06-21 NOTE — Discharge Summary (Signed)
[facility] [place] [facility]   PATIENT NAME: Bethany James    MR#:  [id number]  DATE OF BIRTH:  [birth year]  DATE OF ADMISSION:  06/12/2019 ADMITTING PHYSICIAN: Hannah BeatJan A Mansy, MD  DATE OF DISCHARGE: 06/21/2019  PRIMARY CARE PHYSICIAN: Marjie Skiffannady, Jolene T, NP    ADMISSION DIAGNOSIS:  Adult failure to thrive [R62.7]  DISCHARGE DIAGNOSIS:  UTI adult failure to thrive dementia SECONDARY DIAGNOSIS:   Past Medical History:  Diagnosis Date  . Anemia   . Dementia (HCC)   . Diabetes mellitus without complication [facility](HCC)     HOSPITAL COURSE:  JamicaGreenis a57 y.o.femalewith a known history of dementia, diabetes mellitus. She was originally brought to the hospital from home where she lives with her daughter by EMS services on 06/12/2019 with patient's daughter reporting frequent falls and that she is no longer capable of taking care of the patient who is her mother  1.Urinary tract infection -IV Rocephin--- E. coli sensitive change to PO Keflex  2. Failure to thrive -continue to encourage oral intake -Dietitian consulted for further evaluation and recommendations -patient advised to take bedtime snack  3. Diabetes mellitus-2 -Uncontrolled with episodes of hyperglycemia and hypoglycemia -Sliding scale insulin -decrease Lantus to seven units to avoid daytime and early morning hypoglycemia episode-- sugars much better  4. Dementia -Patient has been evaluated by psychiatry and cleared according to documentation  [facility]--passar recieved -Continue Aricept -palliative care has seen patient--- goals of care  5. History of depression -Continue Cymbalta   [facility]  CONSULTS OBTAINED:    DRUG ALLERGIES:  No Known Allergies  DISCHARGE MEDICATIONS:   Allergies as of 06/21/2019   No Known Allergies     Medication List    STOP taking these medications   insulin  aspart 100 UNIT/ML FlexPen Commonly known as: NOVOLOG     TAKE these medications   donepezil 5 MG tablet Commonly known as: ARICEPT Take 1 tablet by mouth daily.   DULoxetine 60 MG capsule Commonly known as: Cymbalta Take 1 capsule (60 mg total) by mouth daily. What changed:   medication strength  how much to take  how to take this  when to take this  additional instructions   Insulin Detemir 100 UNIT/ML Pen Commonly known as: LEVEMIR Inject 7 Units into the skin daily. What changed: how much to take   multivitamin with minerals Tabs tablet Take 1 tablet by mouth daily. Start taking on: June 22, 2019       If you experience worsening of your admission symptoms, develop shortness of breath, life threatening emergency, suicidal or homicidal thoughts you must seek medical attention immediately by calling 911 or calling your MD immediately  if symptoms less severe.  You Must read complete instructions/literature along with all the possible adverse reactions/side effects for all the Medicines you take and that have been prescribed to you. Take any new Medicines after you have completely understood and accept all the possible adverse reactions/side effects.   Please note  You were cared for by a hospitalist during your hospital stay. If you have any questions about your discharge medications or the care you received while you were in the hospital after you are discharged, you can call the unit and asked to speak with the hospitalist on call if the hospitalist that took care of you is not available. Once you are discharged, your primary care physician will handle any further medical  issues. Please note that NO REFILLS for any discharge medications will be authorized once you are discharged, as it is imperative that you return to your primary care physician (or establish a relationship with a primary care physician if you do not have one) for your aftercare needs so that they can  reassess your need for medications and monitor your lab values.   DATA REVIEW:   CBC  Recent Labs  Lab 06/17/19 0526  WBC 5.2  HGB 13.6  HCT 39.6  PLT 335    Chemistries  Recent Labs  Lab 06/16/19 1606 06/17/19 0526  NA 138 135  K 3.7 4.0  CL 101 103  CO2 25 24  GLUCOSE 64* 315*  BUN 23* 13  CREATININE 0.85 0.81  CALCIUM 10.2 9.5  AST 28  --   ALT 25  --   ALKPHOS 73  --   BILITOT 1.3*  --     Microbiology Results   Recent Results (from the past 240 hour(s))  SARS Coronavirus 2 [facility](Hospital order, Performed in [facility] hospital lab) Nasopharyngeal Nasopharyngeal Swab     Status: None   Collection Time: 06/12/19 11:04 AM   Specimen: Nasopharyngeal Swab  Result Value Ref Range Status   SARS Coronavirus 2 NEGATIVE NEGATIVE Final    Comment: (NOTE) If result is NEGATIVE SARS-CoV-2 target nucleic acids are NOT DETECTED. The SARS-CoV-2 RNA is generally detectable in upper and lower  respiratory specimens during the acute phase of infection. The lowest  concentration of SARS-CoV-2 viral copies this assay can detect is 250  copies / mL. A negative result does not preclude SARS-CoV-2 infection  and should not be used as the sole basis for treatment or other  patient management decisions.  A negative result may occur with  improper specimen collection / handling, submission of specimen other  than nasopharyngeal swab, presence of viral mutation(s) within the  areas targeted by this assay, and inadequate number of viral copies  (<250 copies / mL). A negative result must be combined with clinical  observations, patient history, and epidemiological information. If result is POSITIVE SARS-CoV-2 target nucleic acids are DETECTED. The SARS-CoV-2 RNA is generally detectable in upper and lower  respiratory specimens dur ing the acute phase of infection.  Positive  results are indicative of active infection with SARS-CoV-2.  Clinical  correlation with patient history and  other diagnostic information is  necessary to determine patient infection status.  Positive results do  not rule out bacterial infection or co-infection with other viruses. If result is PRESUMPTIVE POSTIVE SARS-CoV-2 nucleic acids MAY BE PRESENT.   A presumptive positive result was obtained on the submitted specimen  and confirmed on repeat testing.  While 2019 novel coronavirus  (SARS-CoV-2) nucleic acids may be present in the submitted sample  additional confirmatory testing may be necessary for epidemiological  and / or clinical management purposes  to differentiate between  SARS-CoV-2 and other Sarbecovirus currently known to infect humans.  If clinically indicated additional testing with an alternate test  methodology [phone]([id number]) is advised. The SARS-CoV-2 RNA is generally  detectable in upper and lower respiratory sp ecimens during the acute  phase of infection. The expected result is Negative. Fact Sheet for Patients:  BoilerBrush.com.cyhttps://www.fda.gov/media/136312/download Fact Sheet for Healthcare Providers: https://pope.com/https://www.fda.gov/media/136313/download This test is not yet approved or cleared by the [place] FDA and has been authorized for detection and/or diagnosis of SARS-CoV-2 by FDA under an Emergency Use Authorization (EUA).  This EUA will remain in effect (meaning  this test can be used) for the duration of the COVID-19 declaration under Section 564(b)(1) of the Act, 21 U.S.C. section 360bbb-3(b)(1), unless the authorization is terminated or revoked sooner. Performed at [facility], [address] [place], [place] [address]   Urine Culture     Status: Abnormal   Collection Time: 06/16/19  4:06 PM   Specimen: Urine, Catheterized  Result Value Ref Range Status   Specimen Description   Final    URINE, CATHETERIZED Performed at [facility], [address] [place], [place] [address]    Special Requests   Final    NONE Performed at [facility], [place], [place] [address]    Culture >=100,000 COLONIES/mL ESCHERICHIA COLI (A)  Final   Report Status 06/19/2019 FINAL  Final   Organism ID, Bacteria ESCHERICHIA COLI (A)  Final      Susceptibility   Escherichia coli - MIC*    AMPICILLIN >=32 RESISTANT Resistant     CEFAZOLIN <=4 SENSITIVE Sensitive     CEFTRIAXONE <=1 SENSITIVE Sensitive     CIPROFLOXACIN 1 SENSITIVE Sensitive     GENTAMICIN <=1 SENSITIVE Sensitive     IMIPENEM <=0.25 SENSITIVE Sensitive     NITROFURANTOIN <=16 SENSITIVE Sensitive     TRIMETH/SULFA <=20 SENSITIVE Sensitive     AMPICILLIN/SULBACTAM 16 INTERMEDIATE Intermediate     PIP/TAZO <=4 SENSITIVE Sensitive     Extended ESBL NEGATIVE Sensitive     * >=100,000 COLONIES/mL ESCHERICHIA COLI  SARS Coronavirus 2 [facility] order, Performed in [facility] hospital lab) Nasopharyngeal Nasopharyngeal Swab     Status: None   Collection Time: 06/21/19 10:48 AM   Specimen: Nasopharyngeal Swab  Result Value Ref Range Status   SARS Coronavirus 2 NEGATIVE NEGATIVE Final    Comment: (NOTE) If result is NEGATIVE SARS-CoV-2 target nucleic acids are NOT DETECTED. The SARS-CoV-2 RNA is generally detectable in upper and lower  respiratory specimens during the acute phase of infection. The lowest  concentration of SARS-CoV-2 viral copies this assay can detect is 250  copies / mL. A negative result does not preclude SARS-CoV-2 infection  and should not be used as the sole basis for treatment or other  patient management decisions.  A negative result may occur with  improper specimen collection / handling, submission of specimen other  than nasopharyngeal swab, presence of viral mutation(s) within the  areas targeted by this assay, and inadequate number of viral copies  (<250 copies / mL). A negative result must be combined with clinical  observations, patient history, and epidemiological information. If result is POSITIVE SARS-CoV-2 target nucleic acids are  DETECTED. The SARS-CoV-2 RNA is generally detectable in upper and lower  respiratory specimens dur ing the acute phase of infection.  Positive  results are indicative of active infection with SARS-CoV-2.  Clinical  correlation with patient history and other diagnostic information is  necessary to determine patient infection status.  Positive results do  not rule out bacterial infection or co-infection with other viruses. If result is PRESUMPTIVE POSTIVE SARS-CoV-2 nucleic acids MAY BE PRESENT.   A presumptive positive result was obtained on the submitted specimen  and confirmed on repeat testing.  While 2019 novel coronavirus  (SARS-CoV-2) nucleic acids may be present in the submitted sample  additional confirmatory testing may be necessary for epidemiological  and / or clinical management purposes  to differentiate between  SARS-CoV-2 and other Sarbecovirus currently known to infect humans.  If clinically indicated additional testing with  an alternate test  methodology [phone]([id number]) is advised. The SARS-CoV-2 RNA is generally  detectable in upper and lower respiratory sp ecimens during the acute  phase of infection. The expected result is Negative. Fact Sheet for Patients:  BoilerBrush.com.cyhttps://www.fda.gov/media/136312/download Fact Sheet for Healthcare Providers: https://pope.com/https://www.fda.gov/media/136313/download This test is not yet approved or cleared by the [place] FDA and has been authorized for detection and/or diagnosis of SARS-CoV-2 by FDA under an Emergency Use Authorization (EUA).  This EUA will remain in effect (meaning this test can be used) for the duration of the COVID-19 declaration under Section 564(b)(1) of the Act, 21 U.S.C. section 360bbb-3(b)(1), unless the authorization is terminated or revoked sooner. Performed at [facility], [address] [place], [place] [address]     RADIOLOGY:  No results found.   CODE STATUS:     Code Status Orders  (From admission,  onward)         Start     Ordered   06/16/19 2032  Full code  Continuous     06/16/19 2032        Code Status History    Date Active Date Inactive Code Status Order ID Comments User Context   02/20/2019 2206 02/28/2019 2010 Full Code [id number]  Oralia ManisWillis, David, MD Inpatient   Advance Care Planning Activity      TOTAL TIME TAKING CARE OF THIS PATIENT: **40* minutes.    Enedina FinnerSona Jiles Goya M.D on 06/21/2019 at 12:34 PM  Between 7am to 6pm - Pager - [phone] After 6pm go to [email or web address] - Social research officer, governmentpassword EPAS [facility]  [facility] [place] [facility]  Office  [phone]  CC: Primary care physician; Marjie Skiffannady, Jolene T, NP

## 2019-06-21 NOTE — Progress Notes (Signed)
Pt d/c to [facility] in [place] via EMS. Report given to [facility]. All belongings sent with pt. IV removed intact. VSS.

## 2019-06-21 NOTE — Progress Notes (Signed)
[place] at [place] NAME: Fusae Florio    MR#:  [id number]  DATE OF BIRTH:  [birth year]  SUBJECTIVE:   Not communicating much today. She is awake and alert. Sugars  more stable now REVIEW OF SYSTEMS:   Review of Systems  Unable to perform ROS: Dementia   Tolerating Diet: yes Tolerating PT: rehab  DRUG ALLERGIES:  No Known Allergies  VITALS:  Blood pressure (!) 141/92, pulse 78, temperature 98.5 F (36.9 C), resp. rate 17, weight 44.8 kg, SpO2 96 %.  PHYSICAL EXAMINATION:   Physical Exam did exam  GENERAL:  58 y.o.-year-old patient lying in the bed with no acute distress. Appears chronically ill EYES: Pupils equal, round, reactive to light and accommodation. No scleral icterus. Extraocular muscles intact.  HEENT: Head atraumatic, normocephalic. Oropharynx and nasopharynx clear.  NECK:  Supple, no jugular venous distention. No thyroid enlargement, no tenderness.  LUNGS: Normal breath sounds bilaterally, no wheezing, rales, rhonchi. No use of accessory muscles of respiration.  CARDIOVASCULAR: S1, S2 normal. No murmurs, rubs, or gallops.  ABDOMEN: Soft, nontender, nondistended. Bowel sounds present. EXTREMITIES: No cyanosis, clubbing or edema b/l.    NEUROLOGIC: Cranial nerves II through XII are intact. No focal Motor or sensory deficits b/l. Moves all extremities well grossly nonfocal. Generalized weakness PSYCHIATRIC:  patient is alert and dementia at baseline SKIN: No obvious rash, lesion, or ulcer.   LABORATORY PANEL:  CBC Recent Labs  Lab 06/17/19 0526  WBC 5.2  HGB 13.6  HCT 39.6  PLT 335    Chemistries  Recent Labs  Lab 06/16/19 1606 06/17/19 0526  NA 138 135  K 3.7 4.0  CL 101 103  CO2 25 24  GLUCOSE 64* 315*  BUN 23* 13  CREATININE 0.85 0.81  CALCIUM 10.2 9.5  AST 28  --   ALT 25  --   ALKPHOS 73  --   BILITOT 1.3*  --    Cardiac Enzymes No results for input(s): TROPONINI in the last 168  hours. RADIOLOGY:  No results found. ASSESSMENT AND PLAN:   Shyann Hefner  is a 58 y.o. female with a known history of dementia, diabetes mellitus.  She was originally brought to the hospital from home where she lives with her daughter by EMS services on 06/12/2019 with patient's daughter reporting frequent falls and that she is no longer capable of taking care of the patient who is her mother  1. Urinary tract infection - IV Rocephin--- E. coli insensitive change to PO Keflex - urine culture result shows more than hundred thousand colonies of E. coli  2.  Failure to thrive - continue to encourage oral intake - Dietitian consulted for further evaluation and recommendations -patient advised to take bedtime snack  3.  Diabetes mellitus-2 - Uncontrolled with episodes of hyperglycemia and hypoglycemia - Sliding scale insulin -decrease Lantus to seven units to avoid daytime and early morning hypoglycemia episode-- sugars much better  4.  Dementia - Patient has been evaluated by psychiatry and cleared according to documentation  Patient has been accepted at [facility] memory care unit--passar recieved -Continue Aricept -palliative care has seen patient--- goals of care  5. History of depression - Continue Cymbalta   Discharge  Hopefully today   Social worker for discharge planning  CODE STATUS: full  DVT Prophylaxis: Lovenox  TOTAL TIME TAKING CARE OF THIS PATIENT: *25* minutes.  >50% time spent on counselling and coordination of care  POSSIBLE D/C IN ?  DAYS, DEPENDING ON CLINICAL CONDITION.  Note: This dictation was prepared with Dragon dictation along with smaller phrase technology. Any transcriptional errors that result from this process are unintentional.  Enedina FinnerSona Shemar Plemmons M.D on 06/21/2019 at 9:52 AM  Between 7am to 6pm - Pager - [phone]  After 6pm go to [email or web address] - Social research officer, governmentpassword EPAS [facility]  [facility] [place] [facility]  Office  [phone]  CC: Primary care  physician; Marjie Skiffannady, Jolene T, NPPatient ID: Debbe OdeaJamica Terris, female   DOB: 1961/04/14, 58 y.o.   MRN: [id number]

## 2019-06-21 NOTE — TOC Progression Note (Signed)
Transition of Care [facility]) - Progression Note    Patient Details  Name: Bethany James MRN: [id number] Date of Birth: Nov 18, 1960  Transition of Care [facility]) CM/SW Contact  Shelbie Hutching, RN Phone Number: 06/21/2019, 2:44 PM  Clinical Narrative:    Fabienne Bruns, DSS social worker, [phone]  notified that patient is discharging today to the [facility] left a message for her on her work voice mail.    Expected Discharge Plan: Memory Care Barriers to Discharge: Barriers Resolved  Expected Discharge Plan and Services Expected Discharge Plan: Memory Care   Discharge Planning Services: CM Consult     Expected Discharge Date: 06/21/19                                     Social Determinants of Health (SDOH) Interventions    Readmission Risk Interventions No flowsheet data found.

## 2019-06-22 ENCOUNTER — Telehealth: Payer: Self-pay

## 2019-06-22 ENCOUNTER — Ambulatory Visit: Payer: Self-pay | Admitting: *Deleted

## 2019-06-22 DIAGNOSIS — R413 Other amnesia: Secondary | ICD-10-CM

## 2019-06-22 NOTE — Chronic Care Management (AMB) (Signed)
  Chronic Care Management   Follow Up Note   06/22/2019 Name: Bethany James MRN: [id number] DOB: 09/18/1961  Referred by: Venita Lick, NP Reason for referral : Care Coordination (LTC)   Bethany James is a 58 y.o. year old female who is a primary care patient of Cannady, Barbaraann Faster, NP. The CCM team was consulted for assistance with chronic disease management and care coordination needs.    Review of patient status, including review of consultants reports, relevant laboratory and other test results, and collaboration with appropriate care team members and the patient's provider was performed as part of comprehensive patient evaluation and provision of chronic care management services.     Outpatient Encounter Medications as of 06/22/2019  Medication Sig  . donepezil (ARICEPT) 5 MG tablet Take 1 tablet by mouth daily.  . DULoxetine (CYMBALTA) 60 MG capsule Take 1 capsule (60 mg total) by mouth daily.  . Insulin Detemir (LEVEMIR) 100 UNIT/ML Pen Inject 7 Units into the skin daily.  . Multiple Vitamin (MULTIVITAMIN WITH MINERALS) TABS tablet Take 1 tablet by mouth daily.   No facility-administered encounter medications on file as of 06/22/2019.      Goals Addressed            This Visit's Progress   . COMPLETED: Mom has lost weight (pt-stated)       Current Barriers:  Marland Kitchen Knowledge Deficits related to weight loss prevention . Cognitive Deficits  Nurse Case Manager Clinical Goal(s):  Marland Kitchen Over the next 90 days, patient will work with [facility] to address needs related to recent weight loss with out loss of appetite  Interventions:  . Collaborated with patient's daughter/caregiver regarding patient's recent weight loss.  . Discussed plans with patient for ongoing care management follow up and provided patient with direct contact information for care management team . Provided patient with written  educational materials related to how to add calories to food to increase weight.  . Glucerna  samples and coupons given to patient.  . In basket message from PCP that hospital notified her that patient will be discharging and going to the memory care ward at [facility] in [place].   Patient Self Care Activities:  . Currently UNABLE TO independently cook her own meals  Please see past updates related to this goal by clicking on the "Past Updates" button in the selected goal          The patient has been provided with contact information for the care management team and has been advised to call with any health related questions or concerns.  No further follow up required: Patient admitted into LTC in [place], BSN Nurse Case Manager [facility] Care [facility]  [phone]) Business Mobile

## 2019-07-18 ENCOUNTER — Telehealth: Payer: Medicaid Other

## 2019-07-20 ENCOUNTER — Ambulatory Visit: Payer: Medicaid Other | Admitting: Licensed Clinical Social Worker

## 2019-07-20 DIAGNOSIS — F411 Generalized anxiety disorder: Secondary | ICD-10-CM

## 2019-07-20 NOTE — Chronic Care Management (AMB) (Signed)
  Care Management   Follow Up Note   07/20/2019 Name: Bethany James MRN: [id number] DOB: January 31, 1961  Referred by: Venita Lick, NP Reason for referral : Care Coordination   Bethany James is a 58 y.o. year old female who is a primary care patient of Cannady, Barbaraann Faster, NP. The care management team was consulted for assistance with care management and care coordination needs.    Review of patient status, including review of consultants reports, relevant laboratory and other test results, and collaboration with appropriate care team members and the patient's provider was performed as part of comprehensive patient evaluation and provision of chronic care management services.    Goals Addressed    . "My mom needs long term care placement now."       Current Barriers:  . Financial constraints related to long term care placement . Limited social support . Housing barriers . ADL IADL limitations . Family and relationship dysfunction . Limited education about LTC Medicaid* . Limited access to caregiver . Cognitive Deficits . Memory Deficits . Inability to perform ADL's independently . Inability to perform IADL's independently  Clinical Social Work Clinical Goal(s):  Marland Kitchen Over the next 90 days, client will work with SW to address concerns related to worsening dementia and the need for patient to be placed into a memory care LTC facility   Interventions: . Patient interviewed and appropriate assessments performed . Provided patient with information about the long term placement process.  . Discussed plans with patient for ongoing care management follow up and provided patient with direct contact information for care management team . Advised patient to check email for placement resources that LCSW sent out on 06/11/2019 . Assisted patient/caregiver with obtaining information about health plan benefits . Provided education to patient/caregiver regarding level of care options. . Coordinated 3  way call with patient's family to educate them on the LTC placement process and LTC Medicaid application and enrollment process. Patient already has Full Adult Medicaid which will make this transition process easier.  Marland Kitchen UPDATE 07/20/19- Patient continues to stay at [facility] for long term memory care. Family report that patient is doing well there and has adjusted well with this increased socialization. Family shares that the facility has even mentioned how the patient helps the staff out with the other residents at the facility.   Patient Self Care Activities:  . Currently UNABLE TO independently take care of self. Patient is in need of 24/7 care.  Please see past updates related to this goal by clicking on the "Past Updates" button in the selected goal      The patient has been provided with contact information for the care management team and has been advised to call with any health related questions or concerns.   Bethany James, BSW, MSW, [facility] Care Management [email or web address] Phone: [phone]

## 2019-09-04 ENCOUNTER — Ambulatory Visit: Payer: Medicaid Other | Admitting: Nurse Practitioner

## 2019-11-02 DEATH — deceased

## 2020-09-21 IMAGING — DX PORTABLE CHEST - 1 VIEW
1 series · 1 of 1 positions shown · non-contrast
Comparison: 420 through 20 and earlier.

CLINICAL DATA: 57-year-old female with respiratory failure.
Aspiration of bloody gastric contents.

EXAM:
PORTABLE CHEST 1 VIEW

[chest ap]
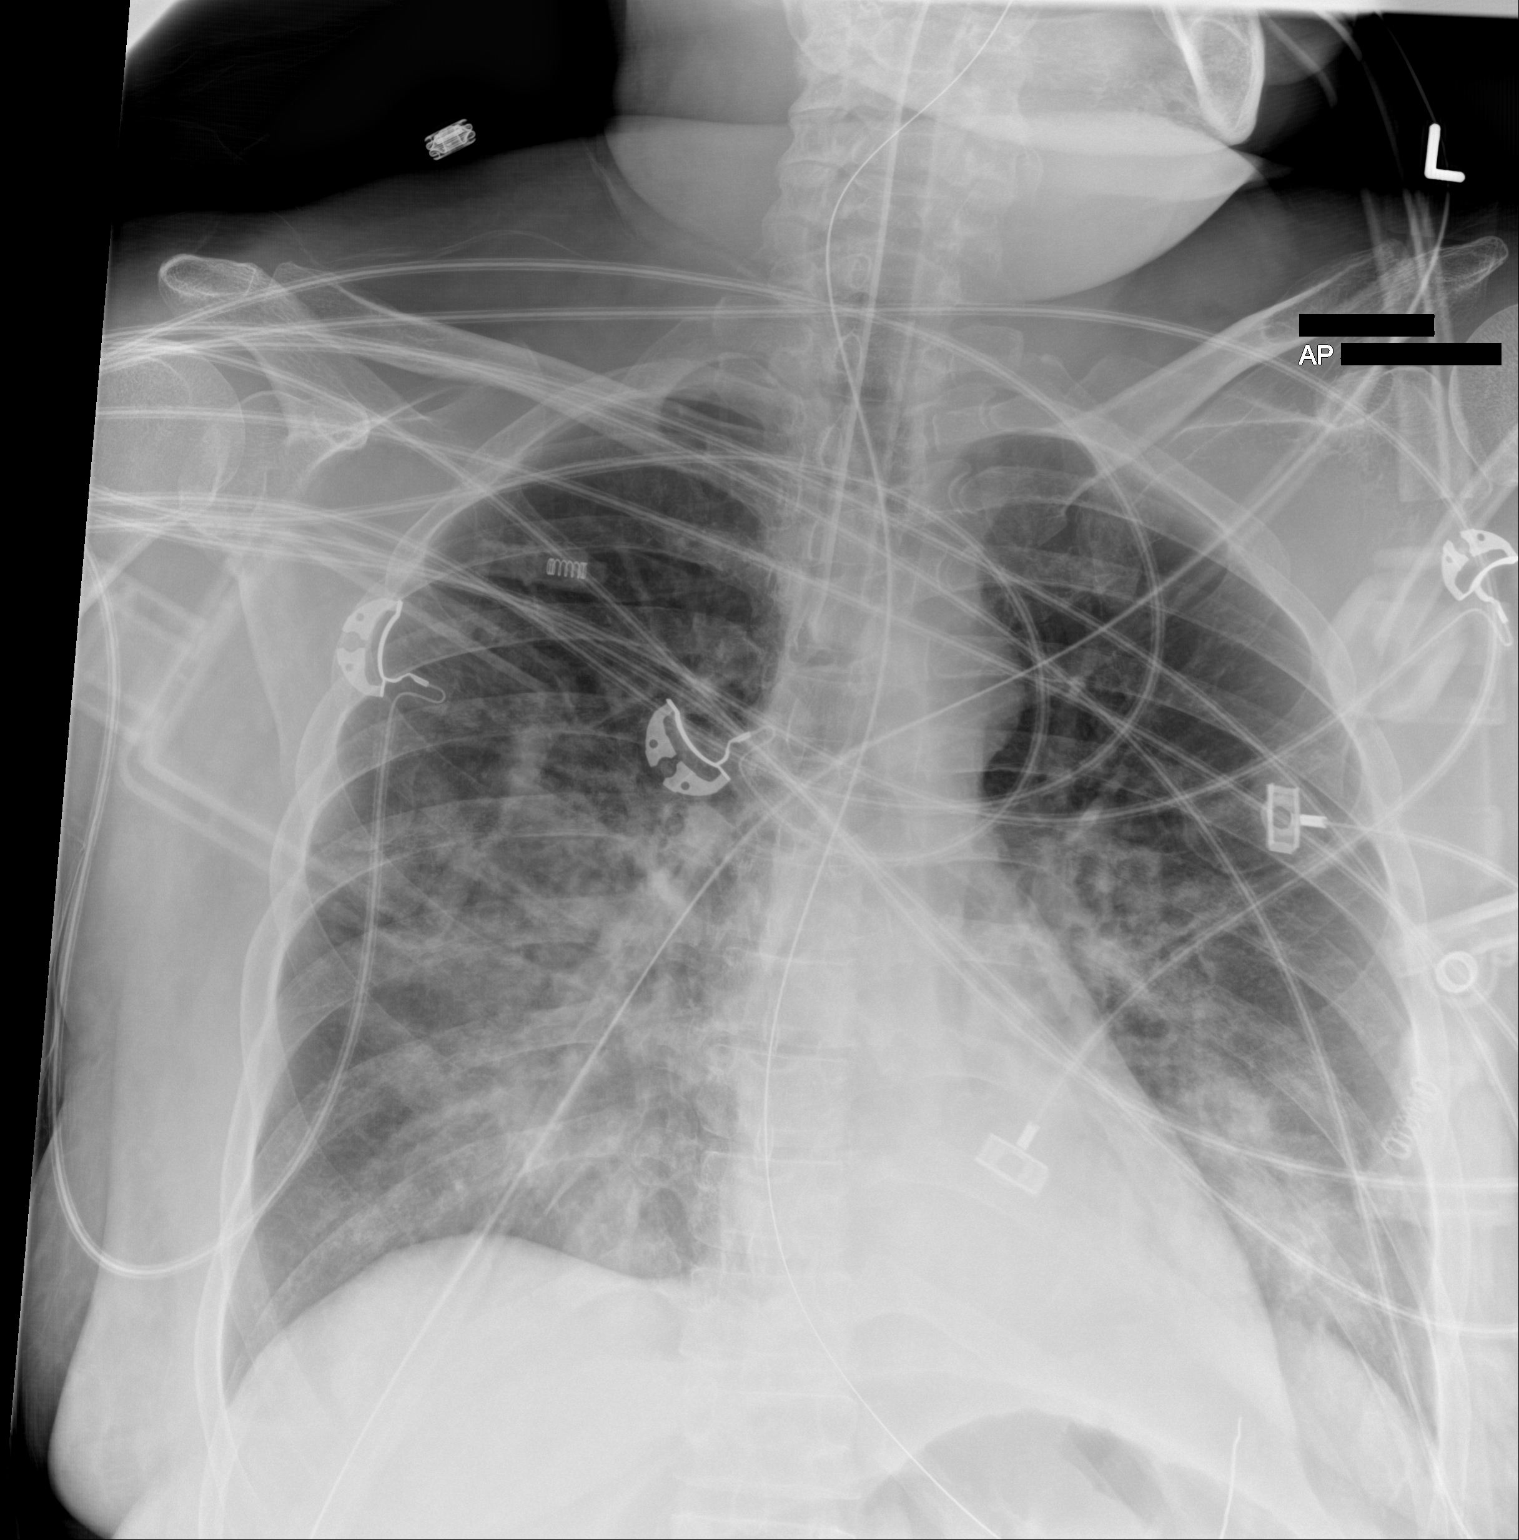

[1 of 1 positions shown; findings below may reference images not displayed]

FINDINGS: Stable endotracheal tube tip in good position. Enteric tube courses
to the midline, tip not included.

Mediastinal contours remain normal. Mildly lower lung volumes.
Confluent bilateral perihilar and lower lung airspace opacity with
retrocardiac air bronchograms. Increased left lower lobe
consolidation since yesterday with some obscuration of the left
hemidiaphragm. No superimposed pneumothorax. No pleural effusion is
evident. Upper lung pulmonary vascularity appears normal.
IMPRESSION: 1. Stable lines and tubes.
2. Confluent bilateral perihilar and lower lung pneumonia with
increased left lower lobe consolidation since yesterday.

## 2021-01-02 IMAGING — CR RIGHT FEMUR 2 VIEWS
1 series · 4 of 4 positions shown · non-contrast
Comparison: None.

CLINICAL DATA: Pain status post fall

EXAM:
RIGHT FEMUR 2 VIEWS

[Series 1: dg femur, min 2 views right · 0.14mm/px · 4 of 4 slices shown]
[im 1/4]
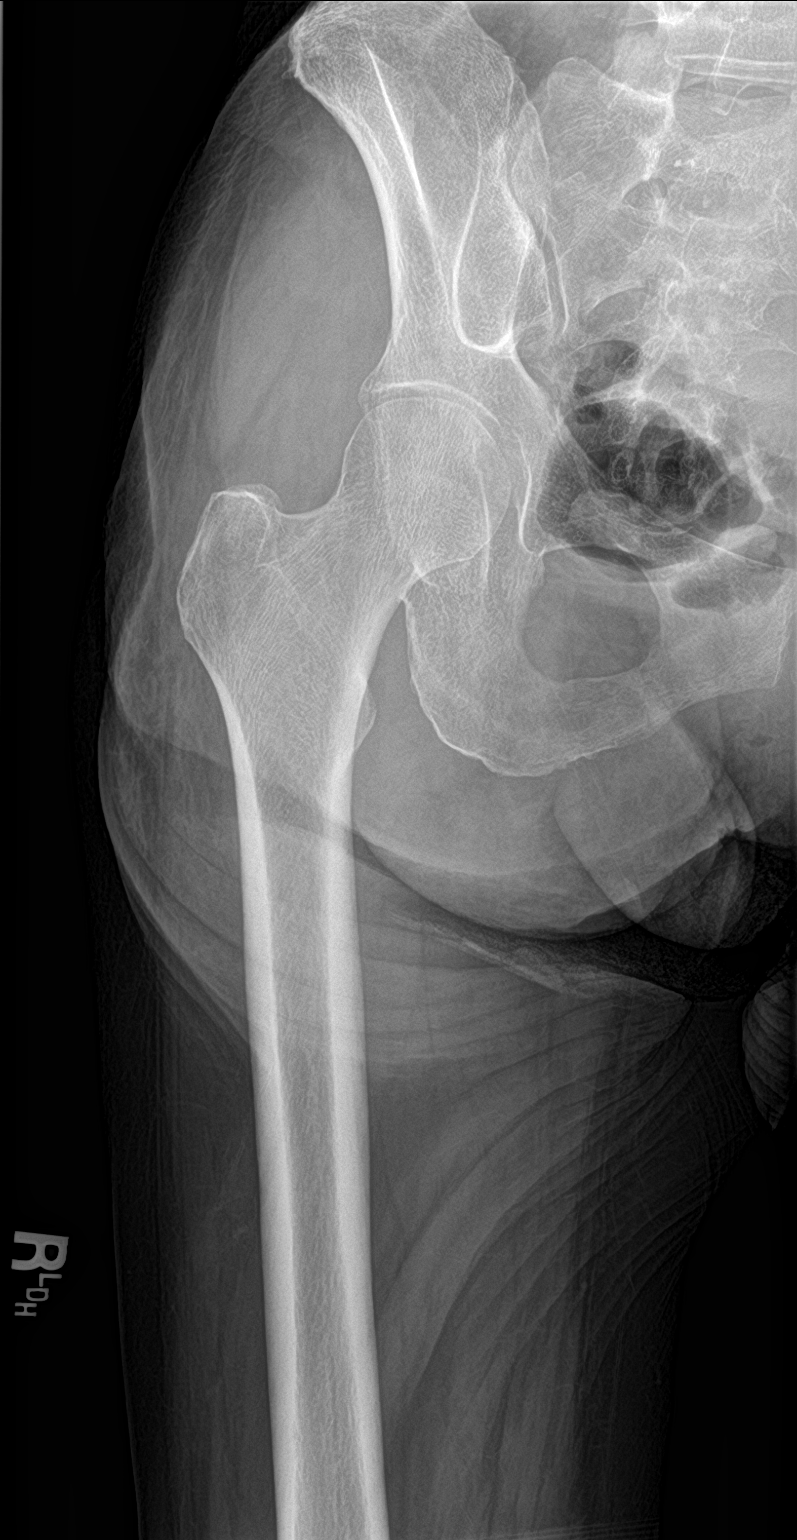
[im 2/4]
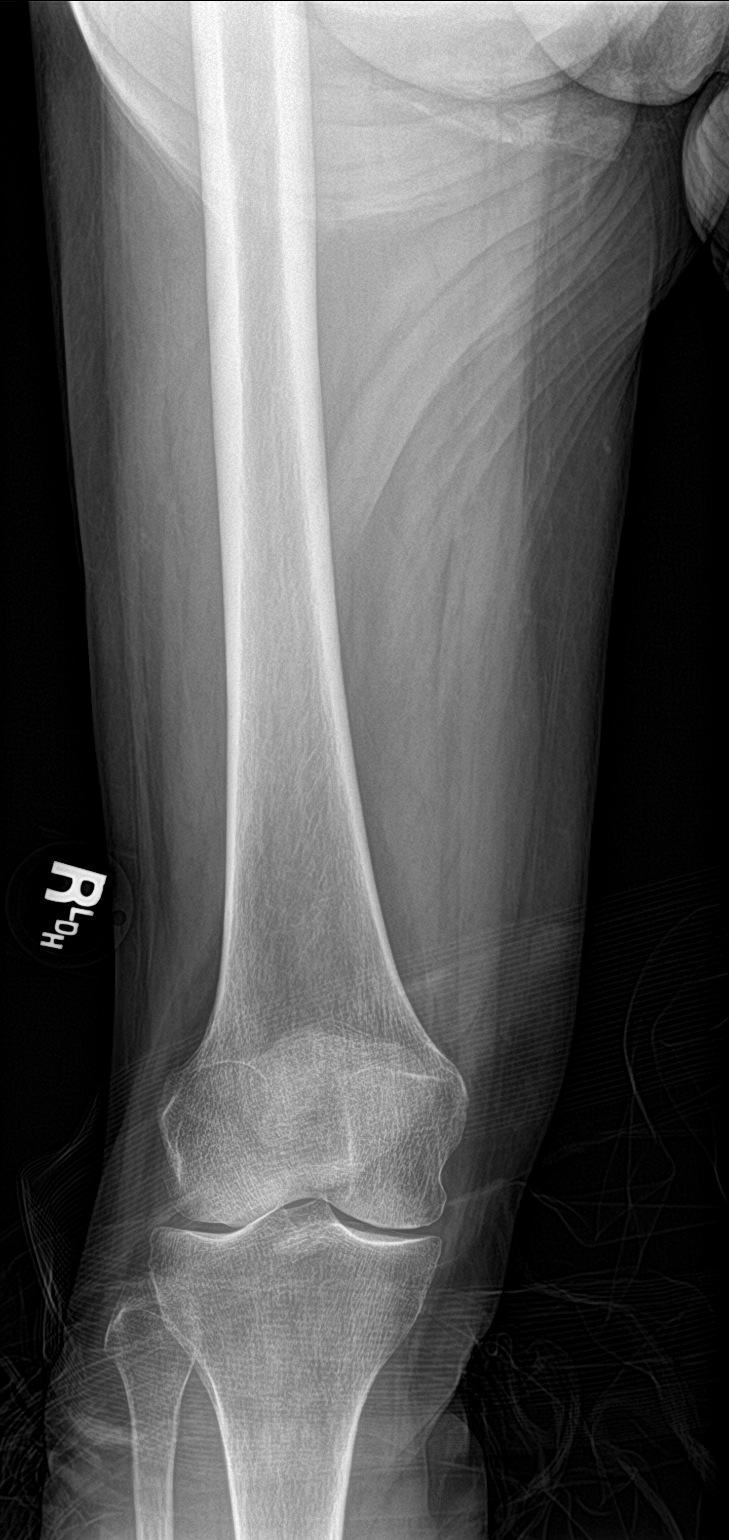
[im 3/4]
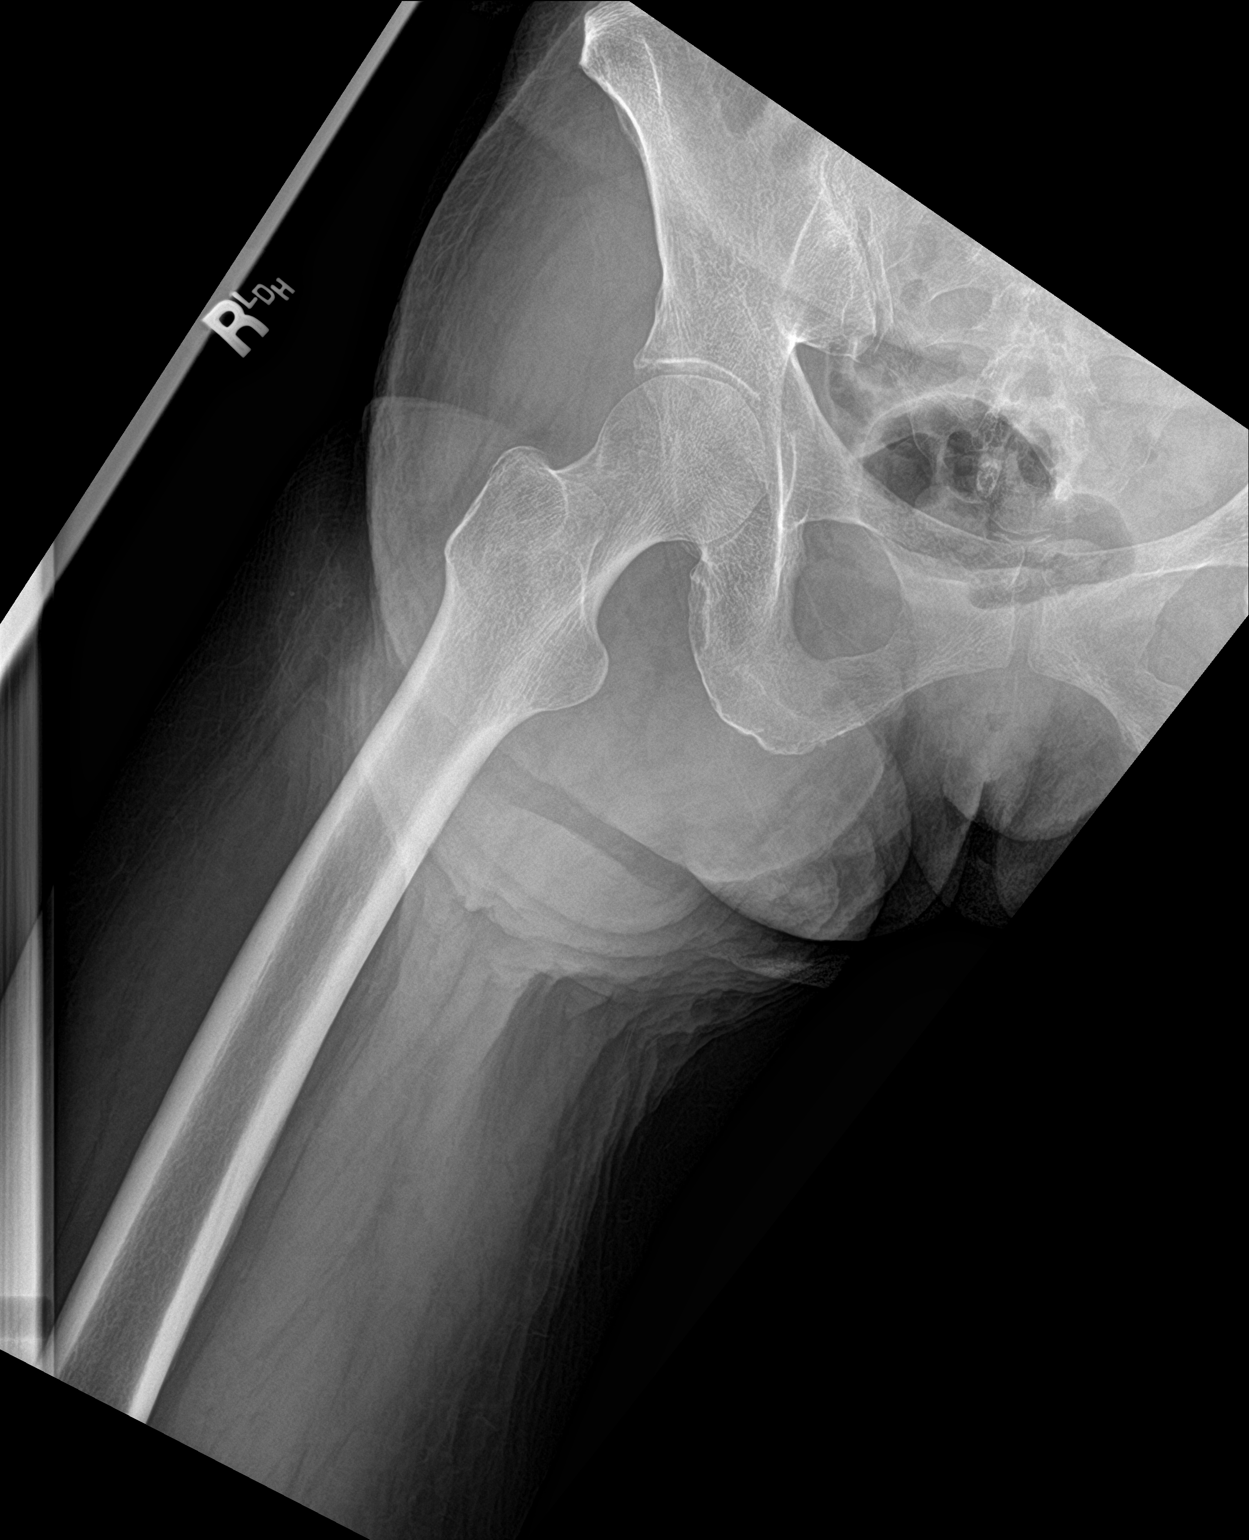
[im 4/4]
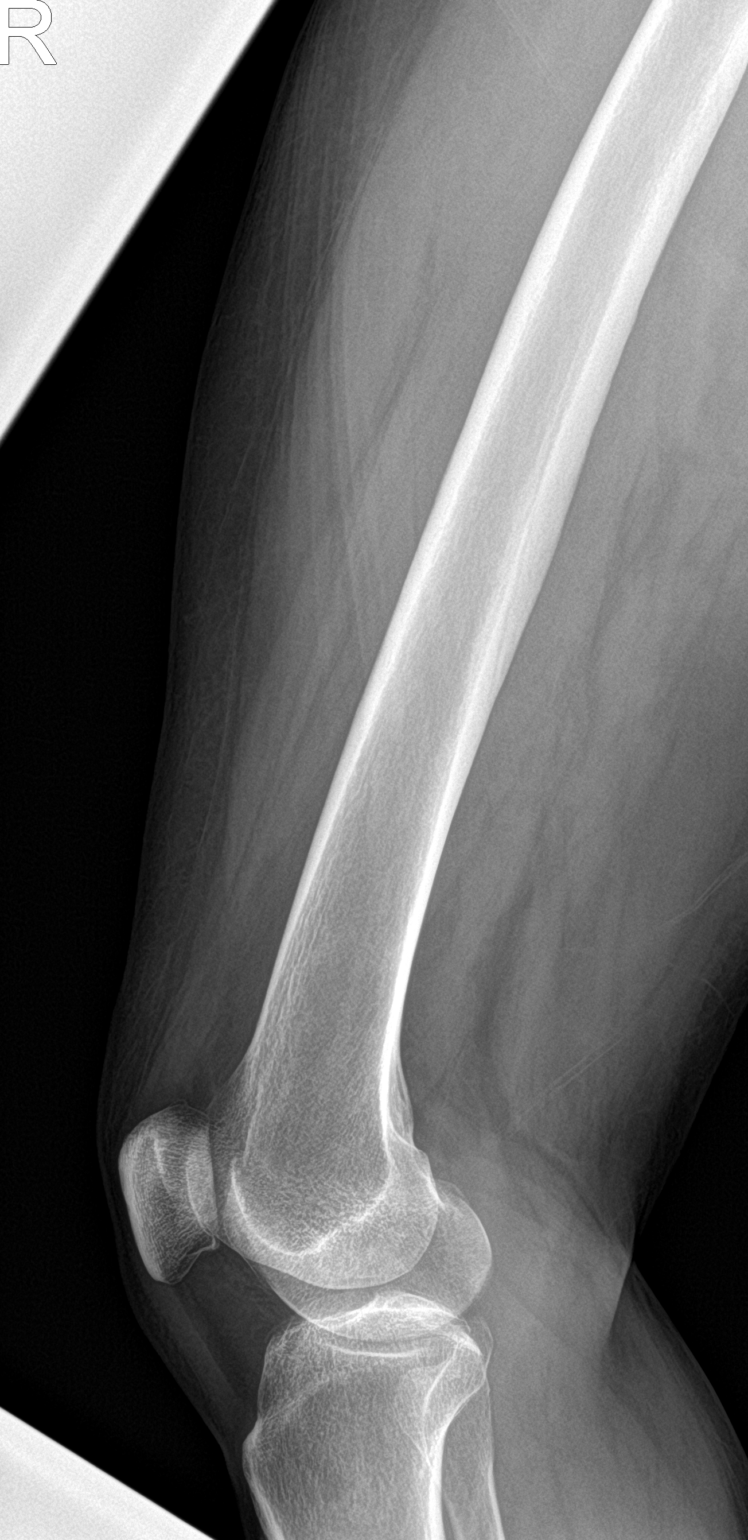

[4 of 4 positions shown; findings below may reference images not displayed]

FINDINGS: There is no evidence of fracture or other focal bone lesions. Soft
tissues are unremarkable.
IMPRESSION: Negative.

## 2021-01-09 IMAGING — DX PORTABLE CHEST - 1 VIEW
1 series · 1 of 1 positions shown · non-contrast
Comparison: February 22, 2019

CLINICAL DATA: Abdominal pain.

EXAM:
PORTABLE CHEST 1 VIEW

[chest ap]
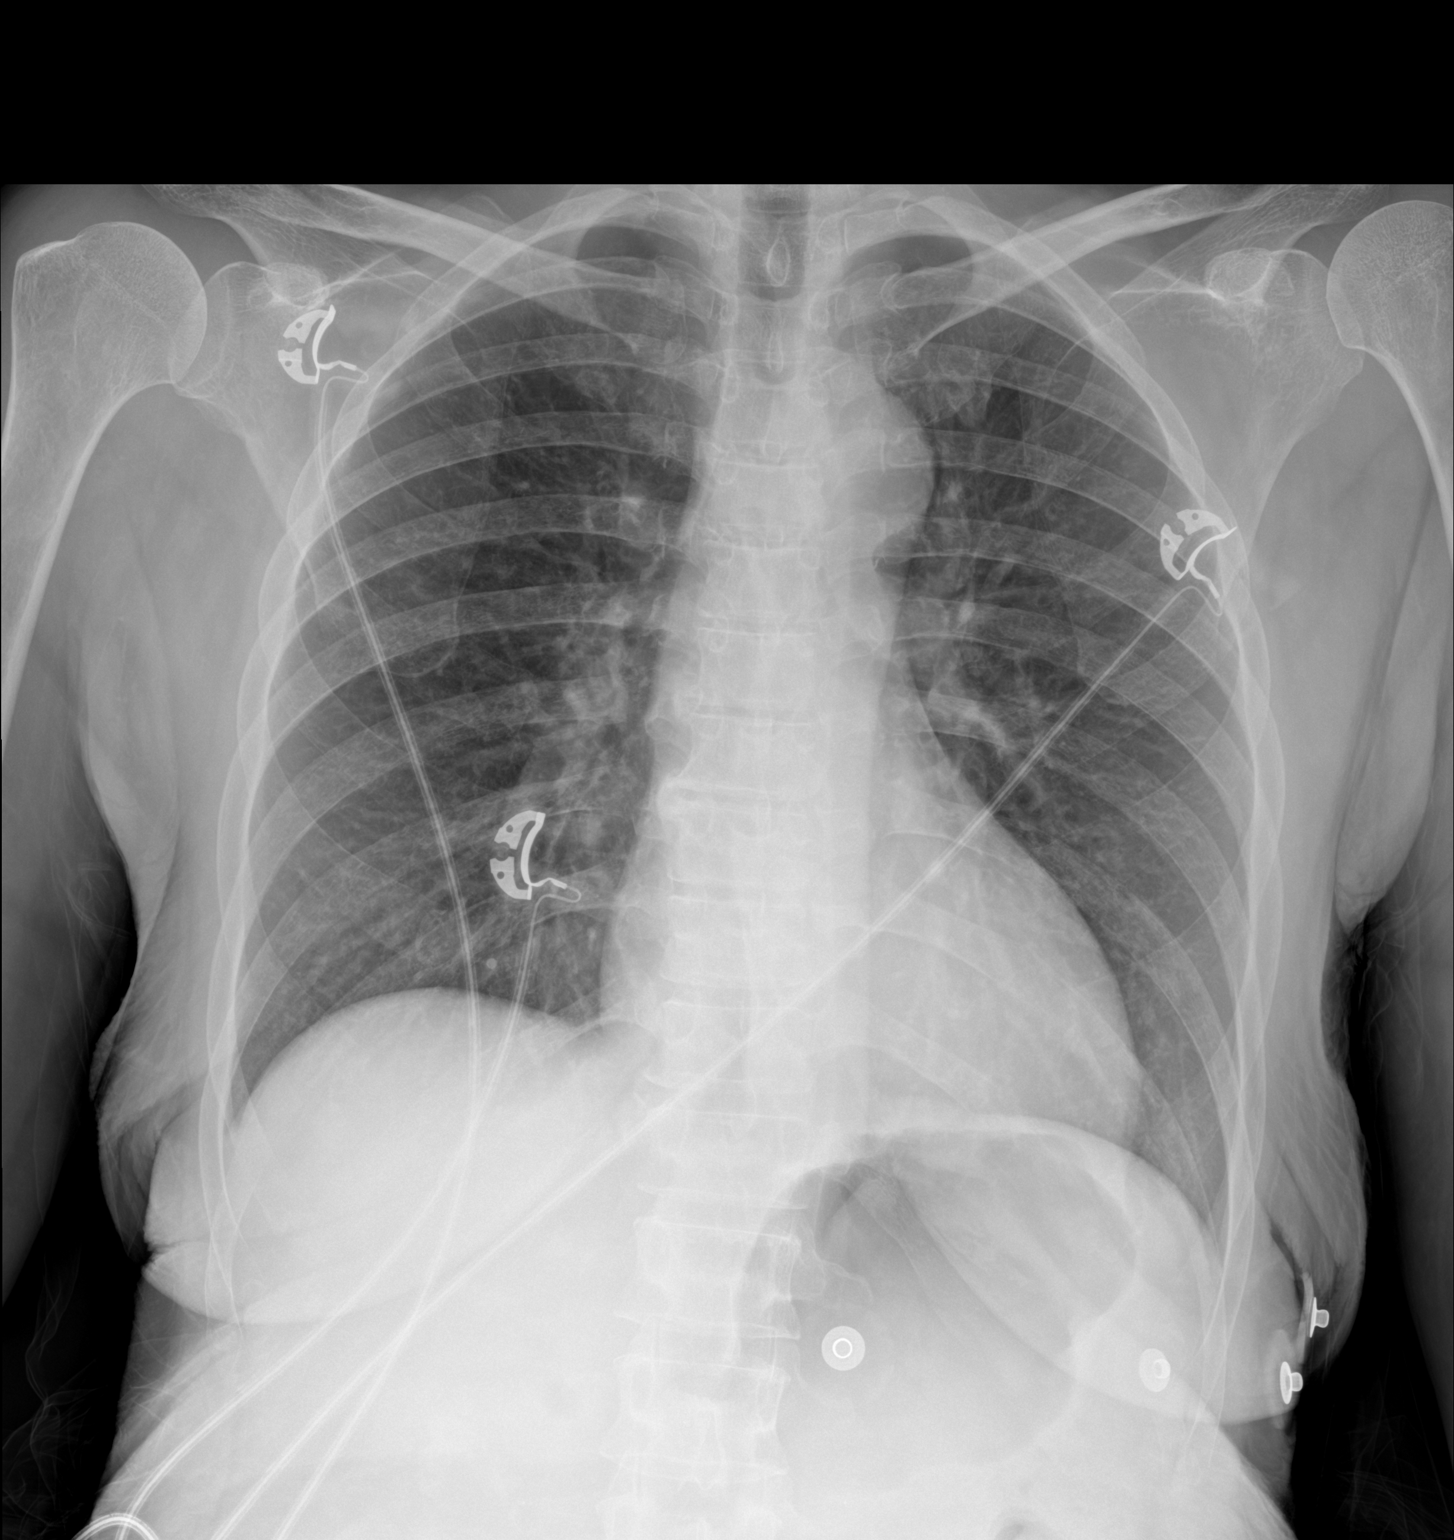

[1 of 1 positions shown; findings below may reference images not displayed]

FINDINGS: The heart size and mediastinal contours are within normal limits.
Both lungs are clear. The visualized skeletal structures are
unremarkable.
IMPRESSION: No active disease.
